# Patient Record
Sex: Male | Born: 1960
Health system: Southern US, Community
[De-identification: ages and names within clinical notes are randomized; demographics above are authoritative.]

## PROBLEM LIST (undated history)

## (undated) DIAGNOSIS — J42 Unspecified chronic bronchitis: Secondary | ICD-10-CM

## (undated) DIAGNOSIS — J45909 Unspecified asthma, uncomplicated: Secondary | ICD-10-CM

## (undated) DIAGNOSIS — E119 Type 2 diabetes mellitus without complications: Secondary | ICD-10-CM

## (undated) DIAGNOSIS — I10 Essential (primary) hypertension: Secondary | ICD-10-CM

## (undated) HISTORY — PX: OTHER SURGICAL HISTORY: SHX169

## (undated) SURGERY — Surgical Case
Anesthesia: *Unknown

---

## 1998-05-17 ENCOUNTER — Emergency Department (HOSPITAL_COMMUNITY): Admission: EM | Admit: 1998-05-17 | Discharge: 1998-05-17 | Payer: Self-pay | Admitting: Emergency Medicine

## 2005-05-10 ENCOUNTER — Encounter: Admission: RE | Admit: 2005-05-10 | Discharge: 2005-05-10 | Payer: Self-pay | Admitting: Neurological Surgery

## 2005-05-18 ENCOUNTER — Ambulatory Visit (HOSPITAL_COMMUNITY): Admission: RE | Admit: 2005-05-18 | Discharge: 2005-05-19 | Payer: Self-pay | Admitting: Neurological Surgery

## 2005-09-04 ENCOUNTER — Encounter: Admission: RE | Admit: 2005-09-04 | Discharge: 2005-09-04 | Payer: Self-pay | Admitting: Neurological Surgery

## 2005-12-05 ENCOUNTER — Encounter: Admission: RE | Admit: 2005-12-05 | Discharge: 2005-12-05 | Payer: Self-pay | Admitting: Neurological Surgery

## 2005-12-24 ENCOUNTER — Inpatient Hospital Stay (HOSPITAL_COMMUNITY): Admission: EM | Admit: 2005-12-24 | Discharge: 2005-12-24 | Payer: Self-pay | Admitting: Emergency Medicine

## 2006-04-03 ENCOUNTER — Encounter: Admission: RE | Admit: 2006-04-03 | Discharge: 2006-04-03 | Payer: Self-pay | Admitting: Neurological Surgery

## 2006-06-20 ENCOUNTER — Ambulatory Visit (HOSPITAL_BASED_OUTPATIENT_CLINIC_OR_DEPARTMENT_OTHER): Admission: RE | Admit: 2006-06-20 | Discharge: 2006-06-20 | Payer: Self-pay | Admitting: Orthopedic Surgery

## 2013-08-12 DIAGNOSIS — Z9889 Other specified postprocedural states: Secondary | ICD-10-CM | POA: Insufficient documentation

## 2015-02-10 DIAGNOSIS — S62639D Displaced fracture of distal phalanx of unspecified finger, subsequent encounter for fracture with routine healing: Secondary | ICD-10-CM | POA: Insufficient documentation

## 2016-06-05 DIAGNOSIS — I219 Acute myocardial infarction, unspecified: Secondary | ICD-10-CM

## 2016-06-05 HISTORY — DX: Acute myocardial infarction, unspecified: I21.9

## 2016-06-23 DIAGNOSIS — M9901 Segmental and somatic dysfunction of cervical region: Secondary | ICD-10-CM | POA: Diagnosis not present

## 2016-06-23 DIAGNOSIS — M9902 Segmental and somatic dysfunction of thoracic region: Secondary | ICD-10-CM | POA: Diagnosis not present

## 2016-06-23 DIAGNOSIS — M531 Cervicobrachial syndrome: Secondary | ICD-10-CM | POA: Diagnosis not present

## 2016-06-23 DIAGNOSIS — M5032 Other cervical disc degeneration, mid-cervical region, unspecified level: Secondary | ICD-10-CM | POA: Diagnosis not present

## 2016-06-26 DIAGNOSIS — M531 Cervicobrachial syndrome: Secondary | ICD-10-CM | POA: Diagnosis not present

## 2016-06-26 DIAGNOSIS — M5032 Other cervical disc degeneration, mid-cervical region, unspecified level: Secondary | ICD-10-CM | POA: Diagnosis not present

## 2016-06-26 DIAGNOSIS — M9901 Segmental and somatic dysfunction of cervical region: Secondary | ICD-10-CM | POA: Diagnosis not present

## 2016-06-26 DIAGNOSIS — M9902 Segmental and somatic dysfunction of thoracic region: Secondary | ICD-10-CM | POA: Diagnosis not present

## 2016-06-28 DIAGNOSIS — M9901 Segmental and somatic dysfunction of cervical region: Secondary | ICD-10-CM | POA: Diagnosis not present

## 2016-06-28 DIAGNOSIS — M9902 Segmental and somatic dysfunction of thoracic region: Secondary | ICD-10-CM | POA: Diagnosis not present

## 2016-06-28 DIAGNOSIS — M5032 Other cervical disc degeneration, mid-cervical region, unspecified level: Secondary | ICD-10-CM | POA: Diagnosis not present

## 2016-06-28 DIAGNOSIS — M531 Cervicobrachial syndrome: Secondary | ICD-10-CM | POA: Diagnosis not present

## 2016-06-29 DIAGNOSIS — M542 Cervicalgia: Secondary | ICD-10-CM | POA: Diagnosis not present

## 2016-06-29 DIAGNOSIS — J0100 Acute maxillary sinusitis, unspecified: Secondary | ICD-10-CM | POA: Diagnosis not present

## 2016-06-29 DIAGNOSIS — J209 Acute bronchitis, unspecified: Secondary | ICD-10-CM | POA: Diagnosis not present

## 2016-06-29 DIAGNOSIS — R062 Wheezing: Secondary | ICD-10-CM | POA: Diagnosis not present

## 2016-06-30 DIAGNOSIS — M531 Cervicobrachial syndrome: Secondary | ICD-10-CM | POA: Diagnosis not present

## 2016-06-30 DIAGNOSIS — M5032 Other cervical disc degeneration, mid-cervical region, unspecified level: Secondary | ICD-10-CM | POA: Diagnosis not present

## 2016-06-30 DIAGNOSIS — M9902 Segmental and somatic dysfunction of thoracic region: Secondary | ICD-10-CM | POA: Diagnosis not present

## 2016-06-30 DIAGNOSIS — M9901 Segmental and somatic dysfunction of cervical region: Secondary | ICD-10-CM | POA: Diagnosis not present

## 2016-07-03 DIAGNOSIS — M5032 Other cervical disc degeneration, mid-cervical region, unspecified level: Secondary | ICD-10-CM | POA: Diagnosis not present

## 2016-07-03 DIAGNOSIS — M531 Cervicobrachial syndrome: Secondary | ICD-10-CM | POA: Diagnosis not present

## 2016-07-03 DIAGNOSIS — M9901 Segmental and somatic dysfunction of cervical region: Secondary | ICD-10-CM | POA: Diagnosis not present

## 2016-07-03 DIAGNOSIS — M9902 Segmental and somatic dysfunction of thoracic region: Secondary | ICD-10-CM | POA: Diagnosis not present

## 2016-07-05 DIAGNOSIS — M9901 Segmental and somatic dysfunction of cervical region: Secondary | ICD-10-CM | POA: Diagnosis not present

## 2016-07-05 DIAGNOSIS — M531 Cervicobrachial syndrome: Secondary | ICD-10-CM | POA: Diagnosis not present

## 2016-07-05 DIAGNOSIS — M9902 Segmental and somatic dysfunction of thoracic region: Secondary | ICD-10-CM | POA: Diagnosis not present

## 2016-07-05 DIAGNOSIS — M5032 Other cervical disc degeneration, mid-cervical region, unspecified level: Secondary | ICD-10-CM | POA: Diagnosis not present

## 2016-07-06 DIAGNOSIS — M5412 Radiculopathy, cervical region: Secondary | ICD-10-CM | POA: Diagnosis not present

## 2016-07-06 DIAGNOSIS — Z981 Arthrodesis status: Secondary | ICD-10-CM | POA: Diagnosis not present

## 2016-07-18 DIAGNOSIS — M5412 Radiculopathy, cervical region: Secondary | ICD-10-CM | POA: Diagnosis not present

## 2016-11-08 DIAGNOSIS — J0100 Acute maxillary sinusitis, unspecified: Secondary | ICD-10-CM | POA: Diagnosis not present

## 2016-11-08 DIAGNOSIS — J209 Acute bronchitis, unspecified: Secondary | ICD-10-CM | POA: Diagnosis not present

## 2016-12-17 ENCOUNTER — Emergency Department (HOSPITAL_COMMUNITY): Payer: Federal, State, Local not specified - PPO

## 2016-12-17 ENCOUNTER — Inpatient Hospital Stay (HOSPITAL_COMMUNITY)
Admission: EM | Admit: 2016-12-17 | Discharge: 2016-12-20 | DRG: 247 | Disposition: A | Discharge status: Home / Self Care | Payer: Federal, State, Local not specified - PPO | Attending: Internal Medicine | Admitting: Internal Medicine

## 2016-12-17 ENCOUNTER — Encounter (HOSPITAL_COMMUNITY): Payer: Self-pay | Admitting: Emergency Medicine

## 2016-12-17 DIAGNOSIS — Z8042 Family history of malignant neoplasm of prostate: Secondary | ICD-10-CM

## 2016-12-17 DIAGNOSIS — R739 Hyperglycemia, unspecified: Secondary | ICD-10-CM | POA: Diagnosis not present

## 2016-12-17 DIAGNOSIS — E119 Type 2 diabetes mellitus without complications: Secondary | ICD-10-CM | POA: Diagnosis not present

## 2016-12-17 DIAGNOSIS — G4440 Drug-induced headache, not elsewhere classified, not intractable: Secondary | ICD-10-CM | POA: Diagnosis present

## 2016-12-17 DIAGNOSIS — R0789 Other chest pain: Secondary | ICD-10-CM | POA: Diagnosis not present

## 2016-12-17 DIAGNOSIS — I10 Essential (primary) hypertension: Secondary | ICD-10-CM | POA: Diagnosis not present

## 2016-12-17 DIAGNOSIS — Z888 Allergy status to other drugs, medicaments and biological substances status: Secondary | ICD-10-CM | POA: Diagnosis not present

## 2016-12-17 DIAGNOSIS — R631 Polydipsia: Secondary | ICD-10-CM | POA: Diagnosis present

## 2016-12-17 DIAGNOSIS — Z955 Presence of coronary angioplasty implant and graft: Secondary | ICD-10-CM

## 2016-12-17 DIAGNOSIS — Z6832 Body mass index (BMI) 32.0-32.9, adult: Secondary | ICD-10-CM | POA: Diagnosis not present

## 2016-12-17 DIAGNOSIS — Z87891 Personal history of nicotine dependence: Secondary | ICD-10-CM

## 2016-12-17 DIAGNOSIS — R079 Chest pain, unspecified: Secondary | ICD-10-CM | POA: Diagnosis not present

## 2016-12-17 DIAGNOSIS — I214 Non-ST elevation (NSTEMI) myocardial infarction: Secondary | ICD-10-CM | POA: Diagnosis not present

## 2016-12-17 DIAGNOSIS — T463X5A Adverse effect of coronary vasodilators, initial encounter: Secondary | ICD-10-CM | POA: Diagnosis present

## 2016-12-17 DIAGNOSIS — Z833 Family history of diabetes mellitus: Secondary | ICD-10-CM

## 2016-12-17 DIAGNOSIS — Z8249 Family history of ischemic heart disease and other diseases of the circulatory system: Secondary | ICD-10-CM | POA: Diagnosis not present

## 2016-12-17 DIAGNOSIS — R351 Nocturia: Secondary | ICD-10-CM | POA: Diagnosis present

## 2016-12-17 DIAGNOSIS — I255 Ischemic cardiomyopathy: Secondary | ICD-10-CM | POA: Diagnosis present

## 2016-12-17 DIAGNOSIS — E1100 Type 2 diabetes mellitus with hyperosmolarity without nonketotic hyperglycemic-hyperosmolar coma (NKHHC): Secondary | ICD-10-CM

## 2016-12-17 DIAGNOSIS — I2510 Atherosclerotic heart disease of native coronary artery without angina pectoris: Secondary | ICD-10-CM | POA: Diagnosis not present

## 2016-12-17 DIAGNOSIS — Z880 Allergy status to penicillin: Secondary | ICD-10-CM

## 2016-12-17 DIAGNOSIS — E669 Obesity, unspecified: Secondary | ICD-10-CM | POA: Diagnosis not present

## 2016-12-17 HISTORY — DX: Unspecified asthma, uncomplicated: J45.909

## 2016-12-17 HISTORY — DX: Essential (primary) hypertension: I10

## 2016-12-17 HISTORY — DX: Unspecified chronic bronchitis: J42

## 2016-12-17 LAB — CBC
HCT: 43 % (ref 39.0–52.0)
Hemoglobin: 14.4 g/dL (ref 13.0–17.0)
MCH: 28.7 pg (ref 26.0–34.0)
MCHC: 33.5 g/dL (ref 30.0–36.0)
MCV: 85.8 fL (ref 78.0–100.0)
PLATELETS: 215 10*3/uL (ref 150–400)
RBC: 5.01 MIL/uL (ref 4.22–5.81)
RDW: 13.8 % (ref 11.5–15.5)
WBC: 7.2 10*3/uL (ref 4.0–10.5)

## 2016-12-17 LAB — GLUCOSE, CAPILLARY
GLUCOSE-CAPILLARY: 244 mg/dL — AB (ref 65–99)
GLUCOSE-CAPILLARY: 263 mg/dL — AB (ref 65–99)

## 2016-12-17 LAB — D-DIMER, QUANTITATIVE: D-Dimer, Quant: 0.39 ug/mL-FEU (ref 0.00–0.50)

## 2016-12-17 LAB — BASIC METABOLIC PANEL
Anion gap: 7 (ref 5–15)
BUN: 11 mg/dL (ref 6–20)
CALCIUM: 8.5 mg/dL — AB (ref 8.9–10.3)
CHLORIDE: 105 mmol/L (ref 101–111)
CO2: 21 mmol/L — AB (ref 22–32)
CREATININE: 0.98 mg/dL (ref 0.61–1.24)
GFR calc Af Amer: >60 mL/min (ref >60)
GFR calc non Af Amer: >60 mL/min (ref >60)
GLUCOSE: 349 mg/dL — AB (ref 65–99)
Potassium: 4.1 mmol/L (ref 3.5–5.1)
Sodium: 133 mmol/L — ABNORMAL LOW (ref 135–145)

## 2016-12-17 LAB — I-STAT TROPONIN, ED
TROPONIN I, POC: 0.05 ng/mL (ref 0.00–0.08)
Troponin i, poc: 0.31 ng/mL (ref 0.00–0.08)

## 2016-12-17 LAB — HEPARIN LEVEL (UNFRACTIONATED): Heparin Unfractionated: 0.48 IU/mL (ref 0.30–0.70)

## 2016-12-17 LAB — MRSA PCR SCREENING: MRSA by PCR: NEGATIVE

## 2016-12-17 LAB — TROPONIN I
Troponin I: 4.21 ng/mL (ref <0.03)
Troponin I: 6.31 ng/mL (ref <0.03)

## 2016-12-17 MED ORDER — ATORVASTATIN CALCIUM 80 MG PO TABS
80.0000 mg | ORAL_TABLET | Freq: Every day | ORAL | Status: DC
  Administered 2016-12-18 – 2016-12-19 (×2): 80 mg via ORAL
  Filled 2016-12-17 (×2): qty 1

## 2016-12-17 MED ORDER — INSULIN ASPART 100 UNIT/ML ~~LOC~~ SOLN
0.0000 [IU] | Freq: Every day | SUBCUTANEOUS | Status: DC
  Administered 2016-12-17: 3 [IU] via SUBCUTANEOUS
  Administered 2016-12-18: 2 [IU] via SUBCUTANEOUS
  Filled 2016-12-17: qty 0.05

## 2016-12-17 MED ORDER — MORPHINE SULFATE (PF) 4 MG/ML IV SOLN
4.0000 mg | Freq: Once | INTRAVENOUS | Status: AC
  Administered 2016-12-17: 4 mg via INTRAVENOUS
  Filled 2016-12-17: qty 1

## 2016-12-17 MED ORDER — SODIUM CHLORIDE 0.9 % IV BOLUS (SEPSIS)
1000.0000 mL | Freq: Once | INTRAVENOUS | Status: AC
  Administered 2016-12-17: 1000 mL via INTRAVENOUS

## 2016-12-17 MED ORDER — ASPIRIN 81 MG PO CHEW
81.0000 mg | CHEWABLE_TABLET | ORAL | Status: AC
  Administered 2016-12-18: 81 mg via ORAL
  Filled 2016-12-17: qty 1

## 2016-12-17 MED ORDER — NITROGLYCERIN IN D5W 200-5 MCG/ML-% IV SOLN
0.0000 ug/min | INTRAVENOUS | Status: DC
  Administered 2016-12-17: 5 ug/min via INTRAVENOUS
  Filled 2016-12-17: qty 250

## 2016-12-17 MED ORDER — SODIUM CHLORIDE 0.9% FLUSH: 3.0000 mL | INTRAVENOUS | Status: DC | PRN

## 2016-12-17 MED ORDER — MORPHINE SULFATE (PF) 4 MG/ML IV SOLN
6.0000 mg | Freq: Once | INTRAVENOUS | Status: AC
  Administered 2016-12-17: 6 mg via INTRAVENOUS
  Filled 2016-12-17: qty 2

## 2016-12-17 MED ORDER — NITROGLYCERIN 0.4 MG SL SUBL: 0.4000 mg | SUBLINGUAL_TABLET | SUBLINGUAL | Status: DC | PRN

## 2016-12-17 MED ORDER — ATORVASTATIN CALCIUM 40 MG PO TABS
40.0000 mg | ORAL_TABLET | Freq: Every day | ORAL | Status: DC
  Administered 2016-12-17: 40 mg via ORAL
  Filled 2016-12-17: qty 1

## 2016-12-17 MED ORDER — ALBUTEROL SULFATE (2.5 MG/3ML) 0.083% IN NEBU: 2.5000 mg | INHALATION_SOLUTION | Freq: Once | RESPIRATORY_TRACT | Status: DC

## 2016-12-17 MED ORDER — SODIUM CHLORIDE 0.9 % IV SOLN: 250.0000 mL | INTRAVENOUS | Status: DC | PRN

## 2016-12-17 MED ORDER — KETOROLAC TROMETHAMINE 30 MG/ML IJ SOLN
30.0000 mg | Freq: Once | INTRAMUSCULAR | Status: AC
  Administered 2016-12-17: 30 mg via INTRAVENOUS
  Filled 2016-12-17: qty 1

## 2016-12-17 MED ORDER — SODIUM CHLORIDE 0.9% FLUSH
3.0000 mL | Freq: Two times a day (BID) | INTRAVENOUS | Status: DC
  Administered 2016-12-17 – 2016-12-18 (×2): 3 mL via INTRAVENOUS

## 2016-12-17 MED ORDER — SODIUM CHLORIDE 0.9 % WEIGHT BASED INFUSION
1.0000 mL/kg/h | INTRAVENOUS | Status: DC
  Administered 2016-12-18: 500 mL via INTRAVENOUS

## 2016-12-17 MED ORDER — INSULIN ASPART 100 UNIT/ML ~~LOC~~ SOLN
0.0000 [IU] | Freq: Three times a day (TID) | SUBCUTANEOUS | Status: DC
  Administered 2016-12-17: 5 [IU] via SUBCUTANEOUS
  Administered 2016-12-18: 11 [IU] via SUBCUTANEOUS
  Administered 2016-12-18: 8 [IU] via SUBCUTANEOUS
  Administered 2016-12-19: 2 [IU] via SUBCUTANEOUS
  Administered 2016-12-19: 3 [IU] via SUBCUTANEOUS
  Administered 2016-12-19: 5 [IU] via SUBCUTANEOUS
  Administered 2016-12-20 (×2): 3 [IU] via SUBCUTANEOUS
  Filled 2016-12-17: qty 0.15

## 2016-12-17 MED ORDER — CARVEDILOL 6.25 MG PO TABS
6.2500 mg | ORAL_TABLET | Freq: Two times a day (BID) | ORAL | Status: DC
  Administered 2016-12-17 – 2016-12-20 (×6): 6.25 mg via ORAL
  Filled 2016-12-17 (×6): qty 1

## 2016-12-17 MED ORDER — HEPARIN (PORCINE) IN NACL 100-0.45 UNIT/ML-% IJ SOLN
1400.0000 [IU]/h | INTRAMUSCULAR | Status: DC
  Administered 2016-12-17: 1400 [IU]/h via INTRAVENOUS
  Filled 2016-12-17 (×2): qty 250

## 2016-12-17 MED ORDER — ONDANSETRON HCL 4 MG/2ML IJ SOLN: 4.0000 mg | Freq: Four times a day (QID) | INTRAMUSCULAR | Status: DC | PRN

## 2016-12-17 MED ORDER — ALPRAZOLAM 0.25 MG PO TABS
0.2500 mg | ORAL_TABLET | Freq: Every evening | ORAL | Status: DC | PRN
  Administered 2016-12-17 – 2016-12-19 (×3): 0.25 mg via ORAL
  Filled 2016-12-17 (×3): qty 1

## 2016-12-17 MED ORDER — SODIUM CHLORIDE 0.9 % WEIGHT BASED INFUSION
3.0000 mL/kg/h | INTRAVENOUS | Status: DC
  Administered 2016-12-18: 3 mL/kg/h via INTRAVENOUS

## 2016-12-17 MED ORDER — MORPHINE SULFATE (PF) 4 MG/ML IV SOLN
4.0000 mg | INTRAVENOUS | Status: DC | PRN
  Administered 2016-12-17: 4 mg via INTRAVENOUS
  Filled 2016-12-17: qty 1

## 2016-12-17 MED ORDER — ASPIRIN EC 81 MG PO TBEC: 81.0000 mg | DELAYED_RELEASE_TABLET | Freq: Every day | ORAL | Status: DC

## 2016-12-17 MED ORDER — HEPARIN SODIUM (PORCINE) 5000 UNIT/ML IJ SOLN
4000.0000 [IU] | Freq: Once | INTRAMUSCULAR | Status: AC
  Administered 2016-12-17: 4000 [IU] via INTRAVENOUS

## 2016-12-17 MED ORDER — IPRATROPIUM-ALBUTEROL 0.5-2.5 (3) MG/3ML IN SOLN
3.0000 mL | Freq: Once | RESPIRATORY_TRACT | Status: AC
  Administered 2016-12-17: 3 mL via RESPIRATORY_TRACT
  Filled 2016-12-17: qty 3

## 2016-12-17 NOTE — Progress Notes (Signed)
CRITICAL VALUE ALERT  Critical Value:  Troponin 4.21  Date & Time Notied:  7/15 1730  Provider Notified: Iver NestleBhagat PA  Orders Received/Actions taken:

## 2016-12-17 NOTE — Progress Notes (Signed)
Critical troponin, 4.21, received. Page text MD on call with results. Pt denies any chest pain, pt is not sob and remains sinus rhythm on monitor. Heparin and nitro gtt's continue.

## 2016-12-17 NOTE — ED Notes (Signed)
Cardiology rounding at bedside.

## 2016-12-17 NOTE — ED Provider Notes (Signed)
[facility]-EMERGENCY DEPT Provider Note   CSN: [id number] Arrival date & time: 12/17/16  0534     History   Chief Complaint Chief Complaint  Patient presents with  . Chest Pain    HPI Connor Reed is a 56 y.o. male.  HPI 56 y.o. male with a hx of HTN, presents to the Emergency Department today due to chest pain when waking this AM around 0300. This woke pt from sleep. Pt states it feels bilateral pressure to both lungs and feels like it is going into his back. Notes pain has been constant since 0300 and rates 8/10. Feels like cramping sensation. Notes diaphoresis. No N/V. Noted numbness/tingling to left shoulder. EMs notified and given NTG x 2 as well as ASA with no change in symptoms. Denies abdominal pain. Notes shortness of breath. Noted URI symptoms without fevers. No cough. No hx ACS. No FH. Risk Factors for ACS include: HTN. No hx DVT/PE. No recent travel. No recent surgeries. No other symptoms noted.   Past Medical History:  Diagnosis Date  . Hypertension     There are no active problems to display for this patient.   History reviewed. No pertinent surgical history.     Home Medications    Prior to Admission medications   Not on File    Family History No family history on file.  Social History Social History  Substance Use Topics  . Smoking status: Former Smoker    Packs/day: 1.00    Years: 20.00    Quit date: 06/19/1990  . Smokeless tobacco: Never Used  . Alcohol use Yes     Comment: drank 12 pack yesterday; typically 1 x per week     Allergies   Patient has no allergy information on record.   Review of Systems Review of Systems ROS reviewed and all are negative for acute change except as noted in the HPI.  Physical Exam Updated Vital Signs BP 139/90   Temp 97.9 F (36.6 C) (Oral)   Resp 14   Ht 6' (1.829 m)   Wt 108.9 kg (240 lb)   SpO2 93%   BMI 32.55 kg/m   Physical Exam  Constitutional: He is oriented to person, place, and time. Vital  signs are normal. He appears well-developed and well-nourished.  Pt clammy. NAD.   HENT:  Head: Normocephalic and atraumatic.  Right Ear: Hearing normal.  Left Ear: Hearing normal.  Eyes: Pupils are equal, round, and reactive to light. Conjunctivae and EOM are normal.  Neck: Normal range of motion. Neck supple.  Cardiovascular: Normal rate, regular rhythm, normal heart sounds and intact distal pulses.   BP Equal BUE ~139/90  Pulmonary/Chest: Effort normal and breath sounds normal.  Abdominal: Soft. Bowel sounds are normal. There is no tenderness. There is no rigidity, no rebound, no guarding, no CVA tenderness, no tenderness at McBurney's point and negative Murphy's sign.  Musculoskeletal: Normal range of motion.  Neurological: He is alert and oriented to person, place, and time.  Skin: Skin is warm and dry.  Psychiatric: He has a normal mood and affect. His speech is normal and behavior is normal. Thought content normal.  Nursing note and vitals reviewed.  ED Treatments / Results  Labs (all labs ordered are listed, but only abnormal results are displayed) Labs Reviewed  BASIC METABOLIC PANEL - Abnormal; Notable for the following:       Result Value   Sodium 133 (*)    CO2 21 (*)    Glucose, Bld  349 (*)    Calcium 8.5 (*)    All other components within normal limits  I-STAT TROPOININ, ED - Abnormal; Notable for the following:    Troponin i, poc 0.31 (*)    All other components within normal limits  CBC  D-DIMER, QUANTITATIVE (NOT AT [facility])  HEPARIN LEVEL (UNFRACTIONATED)  I-STAT TROPOININ, ED    EKG  EKG Interpretation  Date/Time:  Sunday December 17 2016 06:31:54 EDT Ventricular Rate:  72 PR Interval:    QRS Duration: 98 QT Interval:  370 QTC Calculation: 405 R Axis:   36 Text Interpretation:  Sinus rhythm No significant change since last tracing Confirmed by Doug Sou [phone]) on 12/17/2016 10:02:05 AM       Radiology Dg Chest 2 View  Result Date:  12/17/2016 CLINICAL DATA:  Chest pain beginning at 0300 hours, diaphoresis. History of hypertension. EXAM: CHEST  2 VIEW COMPARISON:  Chest radiograph December 24, 2005 FINDINGS: Cardiomediastinal silhouette is normal. No pleural effusions or focal consolidations. Trachea projects midline and there is no pneumothorax. Soft tissue planes and included osseous structures are non-suspicious. ACDF. Mild degenerative change of the thoracic spine. IMPRESSION: No active cardiopulmonary disease. Electronically Signed   By: Awilda Metro M.D.   On: 12/17/2016 06:38   Procedures Procedures (including critical care time) CRITICAL CARE Performed by: Eston Esters   Total critical care time: 35 minutes  Critical care time was exclusive of separately billable procedures and treating other patients.  Critical care was necessary to treat or prevent imminent or life-threatening deterioration.  Critical care was time spent personally by me on the following activities: development of treatment plan with patient and/or surrogate as well as nursing, discussions with consultants, evaluation of patient's response to treatment, examination of patient, obtaining history from patient or surrogate, ordering and performing treatments and interventions, ordering and review of laboratory studies, ordering and review of radiographic studies, pulse oximetry and re-evaluation of patient's condition.   Medications Ordered in ED Medications  heparin injection 4,000 Units (not administered)  nitroGLYCERIN 50 mg in dextrose 5 % 250 mL (0.2 mg/mL) infusion (not administered)  heparin ADULT infusion 100 units/mL (25000 units/264mL sodium chloride 0.45%) (not administered)  sodium chloride 0.9 % bolus 1,000 mL (0 mLs Intravenous Stopped 12/17/16 0831)  morphine 4 MG/ML injection 4 mg (4 mg Intravenous Given 12/17/16 0647)  ketorolac (TORADOL) 30 MG/ML injection 30 mg (30 mg Intravenous Given 12/17/16 0827)  ipratropium-albuterol  (DUONEB) 0.5-2.5 (3) MG/3ML nebulizer solution 3 mL (3 mLs Nebulization Given 12/17/16 0827)  morphine 4 MG/ML injection 6 mg (6 mg Intravenous Given 12/17/16 0956)   Initial Impression / Assessment and Plan / ED Course  I have reviewed the triage vital signs and the nursing notes.  Pertinent labs & imaging results that were available during my care of the patient were reviewed by me and considered in my medical decision making (see chart for details).  Final Clinical Impressions(s) / ED Diagnoses  {I have reviewed and evaluated the relevant laboratory values. {I have reviewed and evaluated the relevant imaging studies. {I have interpreted the relevant EKG. {I have reviewed the relevant previous healthcare records.  {I obtained HPI from historian. {Patient discussed with supervising physician.  ED Course:  Assessment: Pt is a 56 y.o. male with a hx of HTN, presents to the Emergency Department today due to chest pain when waking this AM around 0300. This woke pt from sleep. Pt states it feels bilateral pressure to both lungs and feels like  it is going into his back. Notes pain has been constant since 0300 and rates 8/10. Feels like cramping sensation. Notes diaphoresis. No N/V. Noted numbness/tingling to left shoulder. EMs notified and given NTG x 2 as well as ASA with no change in symptoms. Denies abdominal pain. Notes shortness of breath. Noted URI symptoms without fevers. No cough. No hx ACS. No FH. Risk Factors for ACS include: HTN. No hx DVT/PE. No recent travel. No recent surgeries. On exam, pt in NAD. Appears mildly diaphoretic. Nontoxic/nonseptic appearing. VSS. Afebrile. Lungs CTA. Heart RRR. Abdomen nontender soft. CBC unremarkable. BMP unremarkable. Trop negative. EKG without acute change x 2. CXR unremarkable. D Dimer negative. Heart Score 3. Given morphine in ED. Discussed with attending physician. Discussed options with patient about possible admission for chest pain rule out vs going home  with follow up to Cardiology pending the delta Troponin. Discussed risks and benefits and strict return precautions.   9:44 AM Delta Troponin elevated 0.31 from 0.05. Given Nitro as well as Heparin. Consult to Cardiology. Requested hospitalist admission due to elevated glucose for new onset DM management. Will admit   Disposition/Plan:  Admit Pt acknowledges and agrees with plan  Supervising Physician Doug Sou, MD  Final diagnoses:  NSTEMI (non-ST elevated myocardial infarction) [facility]  Hyperglycemia    New Prescriptions New Prescriptions   No medications on file     Audry Pili, Cordelia Poche 12/17/16 1013    Doug Sou, MD 12/17/16 1736

## 2016-12-17 NOTE — Consult Note (Signed)
Cardiology Consultation:   Patient ID: Connor Reed; [id number]; 23-Aug-1960   Admit date: 12/17/2016 Date of Consult: 12/17/2016  Primary Care Provider: Patient, No Pcp Per Primary Cardiologist: New to [facility]   Patient Profile:   Connor Reed is a 56 y.o. male with prior history of hypertension (not on any medications) who is being seen today for the evaluation of chest pain/non-STEMI at the request of Dr. Rogelia Boga.  No prior cardiac history. Prior tobacco smoker, quit in 1992. He smoked for 20-25 years.  History of Present Illness:   Connor Reed woke up from sleep around 3 AM this morning with left-sided chest pressure 8/10  radiating to his back and left shoulder. Felt like "deep inside the lung". No associated shortness of breath, diaphoresis, nausea or vomiting. No improvement by sublingual nitroglycerin x 2 and aspirin by EMS. His pain somewhat improved after IV morphine in ER. Still having mild deep chest discomfort 2 out of 10. He denies prior similar symptoms taking up from sleep. However, his chest pain is similar when he had a bronchitis in past. He exercises few times in a week without chest pain or shortness of breath. Denies palpitation, dizziness, orthopnea, PND, syncope, lower approximately edema, snoring or blood in his stool or urine.  Blood pressure intermittently elevated. Point-of-care troponin 0.05  --> 0.31. Blood sugars running high in 300s. D-dimer normal. EKG shows normal sinus rhythm with anterior Q-wave. No acute ST/T-wave changes. No prior EKG to compare - personally reviewed.  He is started on IV heparin and IV nitroglycerin.    Past Medical History:  Diagnosis Date  . Hypertension     History reviewed. No pertinent surgical history.   Inpatient Medications: Scheduled Meds: . [START ON 12/18/2016] aspirin EC  81 mg Oral Daily  . atorvastatin  40 mg Oral q1800  . carvedilol  6.25 mg Oral BID WC  . insulin aspart  0-15 Units Subcutaneous TID WC  .  insulin aspart  0-5 Units Subcutaneous QHS   Continuous Infusions: . heparin 1,400 Units/hr (12/17/16 1114)  . nitroGLYCERIN 5 mcg/min (12/17/16 1117)   PRN Meds: nitroGLYCERIN, ondansetron (ZOFRAN) IV  Allergies:    Allergies  Allergen Reactions  . Acetaminophen Itching and Rash  . Penicillins Rash, Shortness Of Breath and Swelling    Social History:   Social History   Social History  . Marital status: Married    Spouse name: N/A  . Number of children: N/A  . Years of education: N/A   Occupational History  . Not on file.   Social History Main Topics  . Smoking status: Former Smoker    Packs/day: 1.00    Years: 20.00    Quit date: 06/19/1990  . Smokeless tobacco: Never Used  . Alcohol use Yes     Comment: drank 12 pack yesterday; typically 1 x per week  . Drug use: No  . Sexual activity: Not on file   Other Topics Concern  . Not on file   Social History Narrative  . No narrative on file    Family History:   The patient's family history includes Cancer in his father; Diabetes in his brother; Heart attack in his maternal grandfather.  ROS:  Please see the history of present illness.  ROS All other ROS reviewed and negative.     Physical Exam/Data:   Vitals:   12/17/16 1130 12/17/16 1145 12/17/16 1200 12/17/16 1215  BP: (!) 156/96 (!) 145/98 (!) 145/91 (!) 133/98  Pulse: 78  73 70 71  Resp: 17 17 15 16   Temp:      TempSrc:      SpO2: 94% 96% (!) 86% 93%  Weight:      Height:        Intake/Output Summary (Last 24 hours) at 12/17/16 1233 Last data filed at 12/17/16 0831  Gross per 24 hour  Intake             1000 ml  Output                0 ml  Net             1000 ml   Filed Weights   12/17/16 0548  Weight: 240 lb (108.9 kg)   Body mass index is 32.55 kg/m.  General:  Well nourished, well developed, in no acute distress HEENT: normal Lymph: no adenopathy Neck: no JVD Endocrine:  No thryomegaly Vascular: No carotid bruits; FA pulses 2+  bilaterally without bruits  Cardiac:  normal S1, S2; RRR; no murmur Lungs:  clear to auscultation bilaterally, no wheezing, rhonchi or rales  Abd: soft, nontender, no hepatomegaly  Ext: no edema Musculoskeletal:  No deformities, BUE and BLE strength normal and equal Skin: warm and dry  Neuro:  CNs 2-12 intact, no focal abnormalities noted Psych:  Normal affect   Laboratory Data:  Chemistry  Recent Labs Lab 12/17/16 0553  NA 133*  K 4.1  CL 105  CO2 21*  GLUCOSE 349*  BUN 11  CREATININE 0.98  CALCIUM 8.5*  GFRNONAA >60  GFRAA >60  ANIONGAP 7    No results for input(s): PROT, ALBUMIN, AST, ALT, ALKPHOS, BILITOT in the last 168 hours. Hematology  Recent Labs Lab 12/17/16 0553  WBC 7.2  RBC 5.01  HGB 14.4  HCT 43.0  MCV 85.8  MCH 28.7  MCHC 33.5  RDW 13.8  PLT 215   Cardiac EnzymesNo results for input(s): TROPONINI in the last 168 hours.   Recent Labs Lab 12/17/16 0608 12/17/16 0931  TROPIPOC 0.05 0.31*    BNPNo results for input(s): BNP, PROBNP in the last 168 hours.  DDimer   Recent Labs Lab 12/17/16 0553  DDIMER 0.39    Radiology/Studies:  Dg Chest 2 View  Result Date: 12/17/2016 CLINICAL DATA:  Chest pain beginning at 0300 hours, diaphoresis. History of hypertension. EXAM: CHEST  2 VIEW COMPARISON:  Chest radiograph December 24, 2005 FINDINGS: Cardiomediastinal silhouette is normal. No pleural effusions or focal consolidations. Trachea projects midline and there is no pneumothorax. Soft tissue planes and included osseous structures are non-suspicious. ACDF. Mild degenerative change of the thoracic spine. IMPRESSION: No active cardiopulmonary disease. Electronically Signed   By: Awilda Metroourtnay  Bloomer M.D.   On: 12/17/2016 06:38    Assessment and Plan:   1. Non-STEMI - His chest pain has a both typical and atypical features. Not reproducible with palpation. Did not improved after sublingual nitroglycerin and aspirin. Improve significantly after morphine.  Still has 2 out of 10 deep lung pain. Chest x-ray without acute cardiopulmonary disease. - Continue cycle troponin. Continue IV heparin and nitroglycerin. Keep NPO for cath tomorrow - Check lipid panel, A1c and TSH for risk stratification. Continue aspirin 81 mg, Lipitor 40 mg and Coreg 6.25 mg twice a day  2. Hypertension - Coreg started this admission.  3. Hyperglycemia - Per primary team  Signed, Point BlankBhagat,Bhavinkumar, PA  12/17/2016 12:33 PM   Personally seen and examined. Agree with above.  56 year old male with hypertension, obesity, former  smoker, diabetes here with chest discomfort, elevated troponin consistent with non-ST elevation myocardial infarction  Currently chest pain-free, no shortness of breath.  Exam: Alert and oriented 3 in no acute distress, lungs are clear bilaterally, skin is tan, heart regular rate and rhythm without any appreciable murmurs rubs or gallops, no JVD, no significant edema, abdomen is obese  EKG: Personally viewed demonstrate sinus rhythm with subtle accentuation of positive T-wave in early precordial leads otherwise no ST segment changes.  Non-ST elevation myocardial infarction  - Elevated troponin, recent chest pain. Continue to cycle serum troponins.  - Nothing by mouth, cardiac catheterization. Risks and benefits explained including stroke, heart attack, death, renal impairment, bleeding. Excellent radial pulse.  - Aspirin, beta blocker, IV nitroglycerin, IV heparin, statin.  Essential hypertension  - Agree with carvedilol. Continue to optimize medication  - Quite elevated.  Diabetes type 2  - Encourage weight loss. Medications per primary team. Check hemoglobin A1c.  Obesity  - Encourage weight loss.  Former smoker  Donato Schultz, MD

## 2016-12-17 NOTE — Progress Notes (Signed)
ANTICOAGULATION CONSULT NOTE - Follow-up Consult  Pharmacy Consult:  Heparin Indication: chest pain/ACS  Allergies  Allergen Reactions  . Acetaminophen Itching and Rash  . Penicillins Rash, Shortness Of Breath and Swelling   Patient Measurements: Height: 6' (182.9 cm) Weight: 237 lb 7 oz (107.7 kg) IBW/kg (Calculated) : 77.6 Heparin Dosing Weight: 100 kg  Vital Signs: Temp: 98.1 F (36.7 C) (07/15 1552) Temp Source: Oral (07/15 1552) BP: 147/96 (07/15 1600) Pulse Rate: 55 (07/15 1600)  Labs:  Recent Labs  12/17/16 0553 12/17/16 1602  HGB 14.4  --   HCT 43.0  --   PLT 215  --   HEPARINUNFRC  --  0.48  CREATININE 0.98  --   TROPONINI  --  4.21*   Estimated Creatinine Clearance: 105.4 mL/min (by C-G formula based on SCr of 0.98 mg/dL).  Medical History: Past Medical History:  Diagnosis Date  . Asthma   . Chronic bronchitis (HCC)   . Hypertension    Assessment: [age] YOM presented with chest and lung pain.  Pharmacy consulted to initiate IV heparin for ACS.  Troponin continue to rise now at 4.21, no chest pain reported.  Baseline labs and home med reviewed.  Heparin level therapeutic: 0.48, no bleeding reported  Goal of Therapy:  Heparin level 0.3-0.7 units/ml Monitor platelets by anticoagulation protocol: Yes   Plan:  -Continue heparin gtt at 1400 units/hr - Daily heparin level and CBC -Monitor for s/sx of bleeding  Ruben Imony Robecca Fulgham, PharmD Clinical Pharmacist 12/17/2016 6:51 PM

## 2016-12-17 NOTE — ED Notes (Signed)
Cards MD at bedside

## 2016-12-17 NOTE — ED Provider Notes (Signed)
Complains of anterior chest pressure intermittent for possible to month, sometimes radiates to left elbow. Symptoms include mild shortness of breath. Pain is nonexertional. Presently discomfort is moderate. On exam appears in no distress lungs clear to auscultation heart regular rate and rhythm abdomen nondistended nontender   Doug SouJacubowitz, Arthi Mcdonald, MD 12/17/16 1736

## 2016-12-17 NOTE — ED Triage Notes (Signed)
[facility] in [facility] with c/o "lung pain" and chest pain since waking at 0300 this morning. Pt states he has been diaphoretic, denies n/v or sob. States pain is worse when breathing in, lungs clear. Placed on 2L for comfort by EMS. Given 324 ASA, 2 NTG per EMS. Brought pain from 8/10 to 6/10. Alert, VSS

## 2016-12-17 NOTE — Progress Notes (Signed)
ANTICOAGULATION CONSULT NOTE - Initial Consult  Pharmacy Consult:  Heparin Indication: chest pain/ACS  Allergies  Allergen Reactions  . Acetaminophen Itching and Rash  . Penicillins Rash, Shortness Of Breath and Swelling    Patient Measurements: Height: 6' (182.9 cm) Weight: 240 lb (108.9 kg) IBW/kg (Calculated) : 77.6 Heparin Dosing Weight: 100 kg  Vital Signs: Temp: 97.9 F (36.6 C) (07/15 0545) Temp Source: Oral (07/15 0545) BP: 160/102 (07/15 0954) Pulse Rate: 91 (07/15 0954)  Labs:  Recent Labs  12/17/16 0553  HGB 14.4  HCT 43.0  PLT 215  CREATININE 0.98    Estimated Creatinine Clearance: 106 mL/min (by C-G formula based on SCr of 0.98 mg/dL).   Medical History: Past Medical History:  Diagnosis Date  . Hypertension       Assessment: [age] YOM presented with chest and lung pain.  Pharmacy consulted to initiate IV heparin for ACS.  Troponin elevated at 0.31.  Baseline labs and home med reviewed.   Goal of Therapy:  Heparin level 0.3-0.7 units/ml Monitor platelets by anticoagulation protocol: Yes    Plan:  - Heparin 4000 units IV as ordered, then - Heparin gtt at 1400 units/hr - Check 6 hr heparin level - Daily heparin level and CBC   Danyah Guastella D. Laney Potashang, PharmD, BCPS Pager:  [phone] 12/17/2016, 10:05 AM

## 2016-12-17 NOTE — H&P (Signed)
Date: 12/17/2016               Patient Name:  Connor Reed MRN: [id number]  DOB: 01/15/1961 Age / Sex: 56 y.o., male   PCP: Patient, No Pcp Per         Medical Service: Internal Medicine Teaching Service         Attending Physician: Dr. Burns SpainButcher, Elizabeth A, MD    First Contact: Dr. Delma Officerhundi Pager: [phone]  Second Contact: Dr. Dimple Caseyice Pager: [phone]       After Hours (After 5p/  First Contact Pager: [phone]  weekends / holidays): Second Contact Pager: [phone]   Chief Complaint: Chest Pain  History of Present Illness:  Connor Reed is a 56 y.o m with pmh of htn, chronic bronchitis, and asthma who presented with chest discomfort that started at 3am this morning (12/17/16). The patient describes the chest discomfort as 8/10 intensity, dull and achy in nature, constant, present over the anterior chest with radiation to the left shoulder blade. The patient has never had chest discomfort like this in the past. The patient states that he had accompanied diaphoresis and difficulty breathing. The patient states that he does not take any medications at home other than aleve occasionally and albuterol. The patient states that he has polydipsia, polyuria, nocturia, and weight gain. He denies any blurry vision, rashes, numbness/tingling.   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   ED course: Morphine, trend troponin, nitro, heparin, consulted cardiology, ekg, chest x-ray   Meds:  Current Meds  Medication Sig  . PROAIR HFA 108 (90 Base) MCG/ACT inhaler Inhale 2 puffs into the lungs 4 (four) times daily as needed for wheezing.      Allergies: Allergies as of 12/17/2016 - Review Complete 12/17/2016  Allergen Reaction Noted  . Acetaminophen Itching and Rash 05/16/2013  . Penicillins Rash, Shortness Of Breath, and Swelling 05/16/2013   Past Medical History:  Diagnosis Date  . Hypertension      Family History:  Grandfather-hx of MI Brother-DM (died from diabetic complications) Father- Cancer (throat, lungs) Uncle- Prostate cancer Sister-HTN  Social History:  Former 1ppd smoker for 20 yrs, quit 1992.  Typically drinks 1x per week, drank 12 pack yesterday (7/14)  Review of Systems: A complete ROS was negative except as per HPI.   Physical Exam: Blood pressure (!) 160/102, pulse 91, temperature 97.9 F (36.6 C), temperature source Oral, resp. rate 17, height 6' (1.829 m), weight 240 lb (108.9 kg), SpO2 94 %.  Physical Exam  Constitutional: He is well-developed, well-nourished, and in no distress.  HENT:  Head: Normocephalic and atraumatic.  Cardiovascular: Normal rate, regular rhythm, normal heart sounds and intact distal pulses.   Pulmonary/Chest: Effort normal and breath sounds normal. No respiratory distress. He has no wheezes.  Abdominal: Soft. Bowel sounds are normal. He exhibits distension. There is no tenderness.  Skin: No rash noted.  Psychiatric: Memory, affect and judgment normal. His mood appears anxious.   EKG: Normal sinus rhythm, no noted ST segment changes  CXR: No Cardiopulmonary disease, no pneumothorax,   Assessment & Plan by Problem:  NSTEMI (non-ST elevated myocardial infarction) [facility](HCC) The patient is complaining of achy chest pain that is described to be cardiac in nature. He has significant risk factors for heart disease which include DM, htn, and age. EKG on admission did not show any st changes. Chest  x-ray did not show any cardiopulmonary process.  -Troponin 0.05 and then subsequently 0.31 while in ED -Cardiology consulted and they will catheterize him tomorrow 7/16 -Placed on carb modified diet  -Aspirin 81mg , nitroglycerin,  Carvedilol, and atorvastatin given -Placed on Heparin drip  -Given 0.9% nacl drip in preparation for catheterization  Diabetes Mellitus Type II Random blood glucose measurement of 349. Will need diabetes education  with close consideration that the patient is anxious having seen his brother cope with diabetes. -HbA1c ordered -Placed on moderate ssi -On aspirin, atorvastatin  HTN Patient has had longstanding htn for which has not been medically managed. Bp has ranged 124-160/81-102 -carvedilol 6.25mg  bid started. Will consider acei after catheterization is done so as to avoid aki.  Pseudohyponatremia Patient's na=133 on admission when corrected considering hyperglycemia of 349 is na=139 -Will continue bmet to monitor  Dispo: Admit patient to Inpatient with expected length of stay greater than 2 midnights.  SignedLorenso Courier, MD 12/17/2016, 11:02 AM  Pager: Pager: [phone]

## 2016-12-18 ENCOUNTER — Encounter (HOSPITAL_COMMUNITY)
Admission: EM | Disposition: A | Discharge status: Home / Self Care | Payer: Self-pay | Source: Home / Self Care | Attending: Interventional Cardiology

## 2016-12-18 DIAGNOSIS — I2510 Atherosclerotic heart disease of native coronary artery without angina pectoris: Secondary | ICD-10-CM

## 2016-12-18 HISTORY — PX: LEFT HEART CATH AND CORONARY ANGIOGRAPHY: CATH118249

## 2016-12-18 LAB — LIPID PANEL
CHOLESTEROL: 161 mg/dL (ref 0–200)
HDL: 31 mg/dL — AB (ref >40)
LDL Cholesterol: 82 mg/dL (ref 0–99)
Total CHOL/HDL Ratio: 5.2 RATIO
Triglycerides: 238 mg/dL — ABNORMAL HIGH (ref <150)
VLDL: 48 mg/dL — ABNORMAL HIGH (ref 0–40)

## 2016-12-18 LAB — BASIC METABOLIC PANEL
Anion gap: 7 (ref 5–15)
BUN: 14 mg/dL (ref 6–20)
CHLORIDE: 104 mmol/L (ref 101–111)
CO2: 25 mmol/L (ref 22–32)
Calcium: 8.3 mg/dL — ABNORMAL LOW (ref 8.9–10.3)
Creatinine, Ser: 0.83 mg/dL (ref 0.61–1.24)
GFR calc Af Amer: >60 mL/min (ref >60)
GFR calc non Af Amer: >60 mL/min (ref >60)
Glucose, Bld: 206 mg/dL — ABNORMAL HIGH (ref 65–99)
POTASSIUM: 3.8 mmol/L (ref 3.5–5.1)
SODIUM: 136 mmol/L (ref 135–145)

## 2016-12-18 LAB — CBC
HCT: 41.1 % (ref 39.0–52.0)
HCT: 43.2 % (ref 39.0–52.0)
Hemoglobin: 13.7 g/dL (ref 13.0–17.0)
Hemoglobin: 14.2 g/dL (ref 13.0–17.0)
MCH: 29.5 pg (ref 26.0–34.0)
MCH: 29 pg (ref 26.0–34.0)
MCHC: 33.3 g/dL (ref 30.0–36.0)
MCHC: 32.9 g/dL (ref 30.0–36.0)
MCV: 88.4 fL (ref 78.0–100.0)
MCV: 88.3 fL (ref 78.0–100.0)
PLATELETS: 201 10*3/uL (ref 150–400)
PLATELETS: 190 10*3/uL (ref 150–400)
RBC: 4.65 MIL/uL (ref 4.22–5.81)
RBC: 4.89 MIL/uL (ref 4.22–5.81)
RDW: 13.8 % (ref 11.5–15.5)
RDW: 13.8 % (ref 11.5–15.5)
WBC: 9.8 10*3/uL (ref 4.0–10.5)
WBC: 9.4 10*3/uL (ref 4.0–10.5)

## 2016-12-18 LAB — CREATININE, SERUM
Creatinine, Ser: 0.8 mg/dL (ref 0.61–1.24)
GFR calc Af Amer: >60 mL/min (ref >60)
GFR calc non Af Amer: >60 mL/min (ref >60)

## 2016-12-18 LAB — GLUCOSE, CAPILLARY
GLUCOSE-CAPILLARY: 319 mg/dL — AB (ref 65–99)
GLUCOSE-CAPILLARY: 259 mg/dL — AB (ref 65–99)
Glucose-Capillary: 175 mg/dL — ABNORMAL HIGH (ref 65–99)

## 2016-12-18 LAB — POCT ACTIVATED CLOTTING TIME
ACTIVATED CLOTTING TIME: 439 s
Activated Clotting Time: 395 seconds

## 2016-12-18 LAB — PROTIME-INR
INR: 1.09
PROTHROMBIN TIME: 14.1 s (ref 11.4–15.2)

## 2016-12-18 LAB — TSH: TSH: 1.461 u[IU]/mL (ref 0.350–4.500)

## 2016-12-18 LAB — HEPARIN LEVEL (UNFRACTIONATED): HEPARIN UNFRACTIONATED: 0.51 [IU]/mL (ref 0.30–0.70)

## 2016-12-18 SURGERY — LEFT HEART CATH AND CORONARY ANGIOGRAPHY
Anesthesia: LOCAL

## 2016-12-18 MED ORDER — SODIUM CHLORIDE 0.9% FLUSH
3.0000 mL | Freq: Two times a day (BID) | INTRAVENOUS | Status: DC
  Administered 2016-12-18 – 2016-12-20 (×4): 3 mL via INTRAVENOUS

## 2016-12-18 MED ORDER — MIDAZOLAM HCL 2 MG/2ML IJ SOLN
INTRAMUSCULAR | Status: DC | PRN
  Administered 2016-12-18 (×2): 1 mg via INTRAVENOUS

## 2016-12-18 MED ORDER — HEPARIN (PORCINE) IN NACL 2-0.9 UNIT/ML-% IJ SOLN
INTRAMUSCULAR | Status: AC | PRN
  Administered 2016-12-18: 1000 mL

## 2016-12-18 MED ORDER — KETOROLAC TROMETHAMINE 15 MG/ML IJ SOLN
15.0000 mg | Freq: Once | INTRAMUSCULAR | Status: AC
  Administered 2016-12-18: 15 mg via INTRAVENOUS
  Filled 2016-12-18: qty 1

## 2016-12-18 MED ORDER — KETOROLAC TROMETHAMINE 30 MG/ML IJ SOLN
30.0000 mg | Freq: Once | INTRAMUSCULAR | Status: AC
  Administered 2016-12-18: 30 mg via INTRAVENOUS
  Filled 2016-12-18: qty 1

## 2016-12-18 MED ORDER — TIROFIBAN (AGGRASTAT) BOLUS VIA INFUSION
INTRAVENOUS | Status: DC | PRN
  Administered 2016-12-18: 2692.5 ug via INTRAVENOUS

## 2016-12-18 MED ORDER — VERAPAMIL HCL 2.5 MG/ML IV SOLN
INTRAVENOUS | Status: DC | PRN
  Administered 2016-12-18: 10 mL via INTRA_ARTERIAL

## 2016-12-18 MED ORDER — LIDOCAINE HCL (PF) 1 % IJ SOLN
INTRAMUSCULAR | Status: DC | PRN
  Administered 2016-12-18: 2 mL

## 2016-12-18 MED ORDER — HEPARIN SODIUM (PORCINE) 5000 UNIT/ML IJ SOLN
5000.0000 [IU] | Freq: Three times a day (TID) | INTRAMUSCULAR | Status: DC
  Administered 2016-12-18 – 2016-12-20 (×3): 5000 [IU] via SUBCUTANEOUS
  Filled 2016-12-18 (×2): qty 1

## 2016-12-18 MED ORDER — IOPAMIDOL (ISOVUE-370) INJECTION 76%
INTRAVENOUS | Status: AC
  Filled 2016-12-18: qty 200

## 2016-12-18 MED ORDER — INSULIN GLARGINE 100 UNIT/ML ~~LOC~~ SOLN
10.0000 [IU] | Freq: Every day | SUBCUTANEOUS | Status: DC
  Administered 2016-12-18: 10 [IU] via SUBCUTANEOUS
  Filled 2016-12-18 (×2): qty 0.1

## 2016-12-18 MED ORDER — CLOPIDOGREL BISULFATE 75 MG PO TABS
75.0000 mg | ORAL_TABLET | Freq: Every day | ORAL | Status: DC
  Administered 2016-12-19 – 2016-12-20 (×2): 75 mg via ORAL
  Filled 2016-12-18 (×2): qty 1

## 2016-12-18 MED ORDER — HEPARIN SODIUM (PORCINE) 1000 UNIT/ML IJ SOLN
INTRAMUSCULAR | Status: AC
  Filled 2016-12-18: qty 1

## 2016-12-18 MED ORDER — NITROGLYCERIN 1 MG/10 ML FOR IR/CATH LAB
INTRA_ARTERIAL | Status: DC | PRN
  Administered 2016-12-18: 200 ug

## 2016-12-18 MED ORDER — ASPIRIN 81 MG PO CHEW
81.0000 mg | CHEWABLE_TABLET | Freq: Every day | ORAL | Status: DC
  Administered 2016-12-19 – 2016-12-20 (×2): 81 mg via ORAL
  Filled 2016-12-18 (×2): qty 1

## 2016-12-18 MED ORDER — CLOPIDOGREL BISULFATE 300 MG PO TABS
ORAL_TABLET | ORAL | Status: DC | PRN
  Administered 2016-12-18: 600 mg via ORAL

## 2016-12-18 MED ORDER — IOPAMIDOL (ISOVUE-370) INJECTION 76%
INTRAVENOUS | Status: AC
  Filled 2016-12-18: qty 100

## 2016-12-18 MED ORDER — HEPARIN SODIUM (PORCINE) 1000 UNIT/ML IJ SOLN
INTRAMUSCULAR | Status: DC | PRN
  Administered 2016-12-18 (×2): 6000 [IU] via INTRAVENOUS

## 2016-12-18 MED ORDER — SODIUM CHLORIDE 0.9% FLUSH: 3.0000 mL | INTRAVENOUS | Status: DC | PRN

## 2016-12-18 MED ORDER — CLOPIDOGREL BISULFATE 300 MG PO TABS
ORAL_TABLET | ORAL | Status: AC
  Filled 2016-12-18: qty 2

## 2016-12-18 MED ORDER — TIROFIBAN HCL IN NACL 5-0.9 MG/100ML-% IV SOLN
0.1500 ug/kg/min | INTRAVENOUS | Status: AC
  Administered 2016-12-18: 0.15 ug/kg/min via INTRAVENOUS
  Filled 2016-12-18 (×2): qty 100

## 2016-12-18 MED ORDER — FENTANYL CITRATE (PF) 100 MCG/2ML IJ SOLN
INTRAMUSCULAR | Status: AC
  Filled 2016-12-18: qty 2

## 2016-12-18 MED ORDER — ONDANSETRON HCL 4 MG/2ML IJ SOLN: 4.0000 mg | Freq: Four times a day (QID) | INTRAMUSCULAR | Status: DC | PRN

## 2016-12-18 MED ORDER — MORPHINE SULFATE (PF) 2 MG/ML IV SOLN
2.0000 mg | INTRAVENOUS | Status: DC | PRN
  Administered 2016-12-18 – 2016-12-19 (×2): 2 mg via INTRAVENOUS
  Filled 2016-12-18 (×2): qty 1

## 2016-12-18 MED ORDER — MIDAZOLAM HCL 2 MG/2ML IJ SOLN
INTRAMUSCULAR | Status: AC
Start: 2016-12-18 — End: ?
  Filled 2016-12-18: qty 2

## 2016-12-18 MED ORDER — NITROGLYCERIN 1 MG/10 ML FOR IR/CATH LAB
INTRA_ARTERIAL | Status: AC
  Filled 2016-12-18: qty 10

## 2016-12-18 MED ORDER — TIROFIBAN HCL IN NACL 5-0.9 MG/100ML-% IV SOLN
INTRAVENOUS | Status: AC
  Filled 2016-12-18: qty 100

## 2016-12-18 MED ORDER — SODIUM CHLORIDE 0.9 % IV SOLN: 250.0000 mL | INTRAVENOUS | Status: DC | PRN

## 2016-12-18 MED ORDER — TIROFIBAN HCL IN NACL 5-0.9 MG/100ML-% IV SOLN
INTRAVENOUS | Status: AC | PRN
  Administered 2016-12-18: 0.15 ug/kg/min via INTRAVENOUS

## 2016-12-18 MED ORDER — LIDOCAINE HCL (PF) 1 % IJ SOLN
INTRAMUSCULAR | Status: AC
  Filled 2016-12-18: qty 30

## 2016-12-18 MED ORDER — HEPARIN (PORCINE) IN NACL 2-0.9 UNIT/ML-% IJ SOLN
INTRAMUSCULAR | Status: AC
  Filled 2016-12-18: qty 1000

## 2016-12-18 MED ORDER — SODIUM CHLORIDE 0.9 % WEIGHT BASED INFUSION
0.5000 mL/kg/h | INTRAVENOUS | Status: AC
  Administered 2016-12-18: 0.5 mL/kg/h via INTRAVENOUS

## 2016-12-18 MED ORDER — HEPARIN (PORCINE) IN NACL 2-0.9 UNIT/ML-% IJ SOLN
INTRAMUSCULAR | Status: DC | PRN
  Administered 2016-12-18: 12:00:00

## 2016-12-18 MED ORDER — IOPAMIDOL (ISOVUE-370) INJECTION 76%
INTRAVENOUS | Status: DC | PRN
  Administered 2016-12-18: 220 mL via INTRA_ARTERIAL

## 2016-12-18 MED ORDER — LABETALOL HCL 5 MG/ML IV SOLN: 10.0000 mg | INTRAVENOUS | Status: AC | PRN

## 2016-12-18 MED ORDER — VERAPAMIL HCL 2.5 MG/ML IV SOLN
INTRAVENOUS | Status: AC
  Filled 2016-12-18: qty 2

## 2016-12-18 MED ORDER — HYDRALAZINE HCL 20 MG/ML IJ SOLN: 5.0000 mg | INTRAMUSCULAR | Status: AC | PRN

## 2016-12-18 MED ORDER — FENTANYL CITRATE (PF) 100 MCG/2ML IJ SOLN
INTRAMUSCULAR | Status: DC | PRN
  Administered 2016-12-18: 50 ug via INTRAVENOUS

## 2016-12-18 SURGICAL SUPPLY — 20 items
BALLN EMERGE MR 2.0X12 (BALLOONS) ×2
BALLN ~~LOC~~ EUPHORA RX 3.75X12 (BALLOONS) ×2
BALLOON EMERGE MR 2.0X12 (BALLOONS) IMPLANT
BALLOON ~~LOC~~ EUPHORA RX 3.75X12 (BALLOONS) IMPLANT
CATH INFINITI 5 FR JL3.5 (CATHETERS) ×1 IMPLANT
CATH VISTA GUIDE 6FR XB3.5 (CATHETERS) ×1 IMPLANT
COVER PRB 48X5XTLSCP FOLD TPE (BAG) IMPLANT
COVER PROBE 5X48 (BAG) ×2
DEVICE RAD COMP TR BAND LRG (VASCULAR PRODUCTS) ×1 IMPLANT
GLIDESHEATH SLEND A-KIT 6F 22G (SHEATH) ×1 IMPLANT
GUIDEWIRE INQWIRE 1.5J.035X260 (WIRE) IMPLANT
INQWIRE 1.5J .035X260CM (WIRE) ×2
KIT ENCORE 26 ADVANTAGE (KITS) ×1 IMPLANT
KIT HEART LEFT (KITS) ×2 IMPLANT
PACK CARDIAC CATHETERIZATION (CUSTOM PROCEDURE TRAY) ×2 IMPLANT
STENT RESOLUTE ONYX 3.5X12 (Permanent Stent) ×1 IMPLANT
STENT RESOLUTE ONYX 3.5X18 (Permanent Stent) ×1 IMPLANT
TRANSDUCER W/STOPCOCK (MISCELLANEOUS) ×2 IMPLANT
TUBING CIL FLEX 10 FLL-RA (TUBING) ×2 IMPLANT
WIRE ASAHI PROWATER 180CM (WIRE) ×2 IMPLANT

## 2016-12-18 NOTE — Progress Notes (Signed)
TR band removed at this time, dsg applied CDI, no bleeding or hematoma noted. Pt instructed to minimize usage of right arm.

## 2016-12-18 NOTE — Progress Notes (Signed)
Inpatient Diabetes Program Recommendations  AACE/ADA: New Consensus Statement on Inpatient Glycemic Control (2015)  Target Ranges:  Prepandial:   less than 140 mg/dL      Peak postprandial:   less than 180 mg/dL (1-2 hours)      Critically ill patients:  140 - 180 mg/dL   Lab Results  Component Value Date   GLUCAP 319 (H) 12/18/2016    Review of Glycemic Control Results for Connor Reed, Connor Reed (MRN [id number]) as of 12/18/2016 10:17  Ref. Range 12/17/2016 15:52 12/17/2016 21:50 12/18/2016 08:06  Glucose-Capillary Latest Ref Range: 65 - 99 mg/dL 147244 (H) 829263 (H) 562319 (H)   Diabetes history: No prior hx noted  Inpatient Diabetes Program Recommendations:  Noted no prior hx DM and pending A1c. Please consider: -Lantus 20 units daily (0.2 units/kg x 107.7 kg)=21.5 units Will order Living Well with Diabetes for review and will follow.  Thank you, Billy FischerJudy E. Ellee Wawrzyniak, RN, MSN, CDE  Diabetes Coordinator Inpatient Glycemic Control Team Team Pager [phone] (8am-5pm) 12/18/2016 10:29 AM

## 2016-12-18 NOTE — Progress Notes (Signed)
   Subjective: [name] was seen resting in his bed and doing well. He was anxious about his new diabetes diagnosis and diagnosis of nstemi. He states that he continues to have a headache which we explained might be attributed to the nitroglycerin drip he is on. The patient denies sob and cp.   Objective:  Vital signs in last 24 hours: Vitals:   12/17/16 2309 12/18/16 0350 12/18/16 0700 12/18/16 1205  BP: 119/78 112/78    Pulse: 65 63    Resp: 14 16    Temp: 98 F (36.7 C) 98.1 F (36.7 C) 97.9 F (36.6 C)   TempSrc: Oral Oral Oral   SpO2: 94% 95%  99%  Weight:      Height:       Physical Exam  Constitutional: He is well-developed, well-nourished, and in no distress.  HENT:  Head: Normocephalic and atraumatic.  Cardiovascular: Normal rate, regular rhythm, normal heart sounds and intact distal pulses.   Pulmonary/Chest: Breath sounds normal. No respiratory distress. He has no wheezes.  Abdominal: Soft. Bowel sounds are normal. He exhibits no distension. There is no tenderness.  Skin: Skin is warm. No rash noted. No erythema.  Psychiatric: Mood, memory, affect and judgment normal.    Assessment/Plan: NSTEMI (non-ST elevated myocardial infarction) (HCC) The patient has NSTEMI without any acute changes on ekg and an elevation in troponin to 4.1 and subsequently to 6.31.  -Cardiac catheterization on 7/16 with 2 drug-eluting (Zotaralimbus) stent placement. Tirofiban available prn intra operatively.  -Continue IV heparin and nitroglycerin. Heparin level in normal range 0.51 -Placed on carb modified diet  -Aspirin 81mg , nitroglycerin,  Carvedilol, and atorvastatin given -Chest x-ray did not show any cardiopulmonary process.  - Lipid panel: Total cholesterol=161 Triglycerides=238, HDL=31, VLDL=48 -On lipitor 80mg  -TSH normal at 1.461  Diabetes Mellitus Type II Random blood glucose measurement ranging 244-319 over past 24 hrs. Will need diabetes education with close consideration  that the patient is anxious having seen his brother cope with diabetes. -HbA1c ordered -Placed on moderate ssi, added 10u lantus -On aspirin, atorvastatin -will start acei following catheterization  HTN Patient has had longstanding htn for which has not been medically managed. Bp has ranged 112-160/78-102 -carvedilol 6.25mg  bid started. Will consider acei after catheterization is done so as to avoid aki.  Pseudohyponatremia Patient's na=133 on admission when corrected considering hyperglycemia of 349 is na=139 -Will continue bmet to monitor   Dispo: Anticipated discharge in approximately 1 day(s).   Connor CourierVahini Zoe Creasman, MD Internal Medicine PGY1 Pager:[phone] 12/18/2016, 12:46 PM

## 2016-12-18 NOTE — Progress Notes (Signed)
ANTICOAGULATION CONSULT NOTE - Follow Up Consult  Pharmacy Consult for Heparin Indication: chest pain/ACS  Allergies  Allergen Reactions  . Acetaminophen Itching and Rash  . Penicillins Rash, Shortness Of Breath and Swelling    Patient Measurements: Height: 6' (182.9 cm) Weight: 237 lb 7 oz (107.7 kg) IBW/kg (Calculated) : 77.6 Heparin Dosing Weight: 100kg  Vital Signs: Temp: 97.9 F (36.6 C) (07/16 0700) Temp Source: Oral (07/16 0700) BP: 112/78 (07/16 0350) Pulse Rate: 63 (07/16 0350)  Labs:  Recent Labs  12/17/16 0553 12/17/16 1602 12/17/16 2054 12/18/16 0317  HGB 14.4  --   --  13.7  HCT 43.0  --   --  41.1  PLT 215  --   --  201  LABPROT  --   --   --  14.1  INR  --   --   --  1.09  HEPARINUNFRC  --  0.48  --  0.51  CREATININE 0.98  --   --  0.83  TROPONINI  --  4.21* 6.31*  --     Estimated Creatinine Clearance: 124.4 mL/min (by C-G formula based on SCr of 0.83 mg/dL).   Medications:  Heparin @ 1400 units/hr  Assessment: 56yom continues on heparin for NSTEMI with plan for cath today. Heparin level is therapeutic at 0.51. CBC stable. No bleeding.  Goal of Therapy:  Heparin level 0.3-0.7 units/ml Monitor platelets by anticoagulation protocol: Yes   Plan:  1) Continue heparin at 1400 units/hr 2) Follow up after cath  Fredrik RiggerMarkle, Kattie Santoyo Sue 12/18/2016,11:21 AM

## 2016-12-18 NOTE — Progress Notes (Signed)
Progress Note  Patient Name: Connor Reed Date of Encounter: 12/18/2016  Primary Cardiologist: Connor Reed   Subjective   Connor Reed was admitted yesterday with chest pain and non-STEMI. He currently is pain-free on IV heparin and nitroglycerin. His only complaints are of a headache probably related to nitroglycerin. He is scheduled to have cardiac catheterization today.  Inpatient Medications    Scheduled Meds: . [START ON 12/19/2016] aspirin EC  81 mg Oral Daily  . atorvastatin  80 mg Oral q1800  . carvedilol  6.25 mg Oral BID WC  . insulin aspart  0-15 Units Subcutaneous TID WC  . insulin aspart  0-5 Units Subcutaneous QHS  . sodium chloride flush  3 mL Intravenous Q12H   Continuous Infusions: . sodium chloride    . sodium chloride    . heparin 1,400 Units/hr (12/17/16 2000)  . nitroGLYCERIN 5 mcg/min (12/17/16 2000)   PRN Meds: sodium chloride, ALPRAZolam, morphine injection, nitroGLYCERIN, ondansetron (ZOFRAN) IV, sodium chloride flush   Vital Signs    Vitals:   12/17/16 1927 12/17/16 2309 12/18/16 0350 12/18/16 0700  BP: (!) 154/86 119/78 112/78   Pulse: 66 65 63   Resp: 15 14 16    Temp: 97.9 F (36.6 C) 98 F (36.7 C) 98.1 F (36.7 C) 97.9 F (36.6 C)  TempSrc: Oral Oral Oral Oral  SpO2: 93% 94% 95%   Weight:      Height:        Intake/Output Summary (Last 24 hours) at 12/18/16 0819 Last data filed at 12/18/16 1610  Gross per 24 hour  Intake          1412.11 ml  Output                0 ml  Net          1412.11 ml   Filed Weights   12/17/16 0548 12/17/16 1552  Weight: 240 lb (108.9 kg) 237 lb 7 oz (107.7 kg)    Telemetry    Sinus rhythm - Personally Reviewed  ECG    Not performed today - Personally Reviewed  Physical Exam   GEN: No acute distress.   Neck: No JVD Cardiac: RRR, no murmurs, rubs, or gallops.  Respiratory: Clear to auscultation bilaterally. GI: Soft, nontender, non-distended  MS: No edema; No deformity. Neuro:  Nonfocal    Psych: Normal affect   Labs    Chemistry Recent Labs Lab 12/17/16 0553 12/18/16 0317  NA 133* 136  K 4.1 3.8  CL 105 104  CO2 21* 25  GLUCOSE 349* 206*  BUN 11 14  CREATININE 0.98 0.83  CALCIUM 8.5* 8.3*  GFRNONAA >60 >60  GFRAA >60 >60  ANIONGAP 7 7     Hematology Recent Labs Lab 12/17/16 0553 12/18/16 0317  WBC 7.2 9.8  RBC 5.01 4.65  HGB 14.4 13.7  HCT 43.0 41.1  MCV 85.8 88.4  MCH 28.7 29.5  MCHC 33.5 33.3  RDW 13.8 13.8  PLT 215 201    Cardiac Enzymes Recent Labs Lab 12/17/16 1602 12/17/16 2054  TROPONINI 4.21* 6.31*    Recent Labs Lab 12/17/16 0608 12/17/16 0931  TROPIPOC 0.05 0.31*     BNPNo results for input(s): BNP, PROBNP in the last 168 hours.   DDimer  Recent Labs Lab 12/17/16 0553  DDIMER 0.39     Radiology    Dg Chest 2 View  Result Date: 12/17/2016 CLINICAL DATA:  Chest pain beginning at 0300 hours, diaphoresis. History of hypertension. EXAM:  CHEST  2 VIEW COMPARISON:  Chest radiograph December 24, 2005 FINDINGS: Cardiomediastinal silhouette is normal. No pleural effusions or focal consolidations. Trachea projects midline and there is no pneumothorax. Soft tissue planes and included osseous structures are non-suspicious. ACDF. Mild degenerative change of the thoracic spine. IMPRESSION: No active cardiopulmonary disease. Electronically Signed   By: Awilda Metroourtnay  Bloomer M.D.   On: 12/17/2016 06:38    Cardiac Studies   None  Patient Profile     56 y.o. male married Caucasian male admitted yesterday for chest pain and non-STEMI. His troponin peaked at 6.3. His EKG showed no acute changes. He is on IV heparin and nitroglycerin. He does have a history of hypertension and potentially newly diagnosed diabetes. He is scheduled for cardiac catheterization today.  Assessment & Plan    1: Non-STEMI-admitted with non-STEMI with troponin peak of 6. On IV heparin and nitroglycerin for cardiac catheterization today  2: Essential  hypertension-on carvedilol. With new diagnosis of diabetes may benefit from being on an ACE inhibitor as well.  The patient is nothing by mouth for cardiac catheterization today.  Connor SiasSigned, Connor Rauf, MD  12/18/2016, 8:19 AM

## 2016-12-18 NOTE — H&P (View-Only) (Signed)
Progress Note  Patient Name: Connor Reed Date of Encounter: 12/18/2016  Primary Cardiologist: Connor Reed   Subjective   Connor Reed was admitted yesterday with chest pain and non-STEMI. He currently is pain-free on IV heparin and nitroglycerin. His only complaints are of a headache probably related to nitroglycerin. He is scheduled to have cardiac catheterization today.  Inpatient Medications    Scheduled Meds: . [START ON 12/19/2016] aspirin EC  81 mg Oral Daily  . atorvastatin  80 mg Oral q1800  . carvedilol  6.25 mg Oral BID WC  . insulin aspart  0-15 Units Subcutaneous TID WC  . insulin aspart  0-5 Units Subcutaneous QHS  . sodium chloride flush  3 mL Intravenous Q12H   Continuous Infusions: . sodium chloride    . sodium chloride    . heparin 1,400 Units/hr (12/17/16 2000)  . nitroGLYCERIN 5 mcg/min (12/17/16 2000)   PRN Meds: sodium chloride, ALPRAZolam, morphine injection, nitroGLYCERIN, ondansetron (ZOFRAN) IV, sodium chloride flush   Vital Signs    Vitals:   12/17/16 1927 12/17/16 2309 12/18/16 0350 12/18/16 0700  BP: (!) 154/86 119/78 112/78   Pulse: 66 65 63   Resp: 15 14 16    Temp: 97.9 F (36.6 C) 98 F (36.7 C) 98.1 F (36.7 C) 97.9 F (36.6 C)  TempSrc: Oral Oral Oral Oral  SpO2: 93% 94% 95%   Weight:      Height:        Intake/Output Summary (Last 24 hours) at 12/18/16 0819 Last data filed at 12/18/16 1610  Gross per 24 hour  Intake          1412.11 ml  Output                0 ml  Net          1412.11 ml   Filed Weights   12/17/16 0548 12/17/16 1552  Weight: 240 lb (108.9 kg) 237 lb 7 oz (107.7 kg)    Telemetry    Sinus rhythm - Personally Reviewed  ECG    Not performed today - Personally Reviewed  Physical Exam   GEN: No acute distress.   Neck: No JVD Cardiac: RRR, no murmurs, rubs, or gallops.  Respiratory: Clear to auscultation bilaterally. GI: Soft, nontender, non-distended  MS: No edema; No deformity. Neuro:  Nonfocal    Psych: Normal affect   Labs    Chemistry Recent Labs Lab 12/17/16 0553 12/18/16 0317  NA 133* 136  K 4.1 3.8  CL 105 104  CO2 21* 25  GLUCOSE 349* 206*  BUN 11 14  CREATININE 0.98 0.83  CALCIUM 8.5* 8.3*  GFRNONAA >60 >60  GFRAA >60 >60  ANIONGAP 7 7     Hematology Recent Labs Lab 12/17/16 0553 12/18/16 0317  WBC 7.2 9.8  RBC 5.01 4.65  HGB 14.4 13.7  HCT 43.0 41.1  MCV 85.8 88.4  MCH 28.7 29.5  MCHC 33.5 33.3  RDW 13.8 13.8  PLT 215 201    Cardiac Enzymes Recent Labs Lab 12/17/16 1602 12/17/16 2054  TROPONINI 4.21* 6.31*    Recent Labs Lab 12/17/16 0608 12/17/16 0931  TROPIPOC 0.05 0.31*     BNPNo results for input(s): BNP, PROBNP in the last 168 hours.   DDimer  Recent Labs Lab 12/17/16 0553  DDIMER 0.39     Radiology    Dg Chest 2 View  Result Date: 12/17/2016 CLINICAL DATA:  Chest pain beginning at 0300 hours, diaphoresis. History of hypertension. EXAM:  CHEST  2 VIEW COMPARISON:  Chest radiograph December 24, 2005 FINDINGS: Cardiomediastinal silhouette is normal. No pleural effusions or focal consolidations. Trachea projects midline and there is no pneumothorax. Soft tissue planes and included osseous structures are non-suspicious. ACDF. Mild degenerative change of the thoracic spine. IMPRESSION: No active cardiopulmonary disease. Electronically Signed   By: Awilda Metroourtnay  Bloomer M.D.   On: 12/17/2016 06:38    Cardiac Studies   None  Patient Profile     56 y.o. male married Caucasian male admitted yesterday for chest pain and non-STEMI. His troponin peaked at 6.3. His EKG showed no acute changes. He is on IV heparin and nitroglycerin. He does have a history of hypertension and potentially newly diagnosed diabetes. He is scheduled for cardiac catheterization today.  Assessment & Plan    1: Non-STEMI-admitted with non-STEMI with troponin peak of 6. On IV heparin and nitroglycerin for cardiac catheterization today  2: Essential  hypertension-on carvedilol. With new diagnosis of diabetes may benefit from being on an ACE inhibitor as well.  The patient is nothing by mouth for cardiac catheterization today.  Connor SiasSigned, Connor Liendo, MD  12/18/2016, 8:19 AM

## 2016-12-18 NOTE — Interval H&P Note (Signed)
Cath Lab Visit (complete for each Cath Lab visit)  Clinical Evaluation Leading to the Procedure:   ACS: Yes.    Non-ACS:    Anginal Classification: CCS IV  Anti-ischemic medical therapy: Minimal Therapy (1 class of medications)  Non-Invasive Test Results: No non-invasive testing performed  Prior CABG: No previous CABG      History and Physical Interval Note:  12/18/2016 11:50 AM  Connor Reed  has presented today for surgery, with the diagnosis of n stemi  The various methods of treatment have been discussed with the patient and family. After consideration of risks, benefits and other options for treatment, the patient has consented to  Procedure(s): Left Heart Cath and Coronary Angiography (N/A) as a surgical intervention .  The patient's history has been reviewed, patient examined, no change in status, stable for surgery.  I have reviewed the patient's chart and labs.  Questions were answered to the patient's satisfaction.     Lyn RecordsHenry W Lakenya Riendeau III

## 2016-12-19 ENCOUNTER — Encounter (HOSPITAL_COMMUNITY): Payer: Self-pay | Admitting: Interventional Cardiology

## 2016-12-19 DIAGNOSIS — I255 Ischemic cardiomyopathy: Secondary | ICD-10-CM

## 2016-12-19 LAB — BASIC METABOLIC PANEL
ANION GAP: 6 (ref 5–15)
BUN: 11 mg/dL (ref 6–20)
CALCIUM: 8.3 mg/dL — AB (ref 8.9–10.3)
CO2: 22 mmol/L (ref 22–32)
Chloride: 106 mmol/L (ref 101–111)
Creatinine, Ser: 0.78 mg/dL (ref 0.61–1.24)
GFR calc Af Amer: >60 mL/min (ref >60)
GLUCOSE: 131 mg/dL — AB (ref 65–99)
Potassium: 3.5 mmol/L (ref 3.5–5.1)
Sodium: 134 mmol/L — ABNORMAL LOW (ref 135–145)

## 2016-12-19 LAB — CBC
HCT: 41.7 % (ref 39.0–52.0)
Hemoglobin: 13.9 g/dL (ref 13.0–17.0)
MCH: 29.4 pg (ref 26.0–34.0)
MCHC: 33.3 g/dL (ref 30.0–36.0)
MCV: 88.2 fL (ref 78.0–100.0)
PLATELETS: 201 10*3/uL (ref 150–400)
RBC: 4.73 MIL/uL (ref 4.22–5.81)
RDW: 13.5 % (ref 11.5–15.5)
WBC: 8.3 10*3/uL (ref 4.0–10.5)

## 2016-12-19 LAB — GLUCOSE, CAPILLARY
GLUCOSE-CAPILLARY: 180 mg/dL — AB (ref 65–99)
GLUCOSE-CAPILLARY: 235 mg/dL — AB (ref 65–99)
GLUCOSE-CAPILLARY: 200 mg/dL — AB (ref 65–99)
Glucose-Capillary: 150 mg/dL — ABNORMAL HIGH (ref 65–99)
Glucose-Capillary: 211 mg/dL — ABNORMAL HIGH (ref 65–99)

## 2016-12-19 LAB — HEMOGLOBIN A1C
Hgb A1c MFr Bld: 10.3 % — ABNORMAL HIGH (ref 4.8–5.6)
Mean Plasma Glucose: 249 mg/dL

## 2016-12-19 MED ORDER — LISINOPRIL 5 MG PO TABS
5.0000 mg | ORAL_TABLET | Freq: Every day | ORAL | Status: DC
  Administered 2016-12-19 – 2016-12-20 (×2): 5 mg via ORAL
  Filled 2016-12-19 (×2): qty 1

## 2016-12-19 MED ORDER — INSULIN GLARGINE 100 UNIT/ML ~~LOC~~ SOLN
20.0000 [IU] | Freq: Every day | SUBCUTANEOUS | Status: DC
  Administered 2016-12-19: 20 [IU] via SUBCUTANEOUS
  Filled 2016-12-19 (×2): qty 0.2

## 2016-12-19 MED ORDER — INSULIN STARTER KIT- PEN NEEDLES (ENGLISH)
1.0000 | Freq: Once | Status: AC
  Administered 2016-12-19: 1
  Filled 2016-12-19: qty 1

## 2016-12-19 MED ORDER — LIVING WELL WITH DIABETES BOOK
Freq: Once | Status: AC
  Administered 2016-12-19: 10:00:00
  Filled 2016-12-19: qty 1

## 2016-12-19 NOTE — Progress Notes (Signed)
   Subjective: [name] was seen laying in his bed this morning with his wife at bedside. He said that he was doing well and understood the catheterization procedure he underwent yesterday 7/16. He said he understands what diabetes is as he has watched his brother go through it. However, he is concerned whether the medication he has been placed on will affect his kidney as he only has one. We educated him on the medications he will go home with and mentioned that he can follow up with IM in our clinic. He says that he no longer has any headaches and says that the soreness in his chest has also subsided.   Objective:  Vital signs in last 24 hours: Vitals:   12/19/16 0001 12/19/16 0412 12/19/16 0700 12/19/16 0800  BP: 106/63 104/67  136/90  Pulse: 72 74  61  Resp: 17 17  16   Temp: (!) 97.5 F (36.4 C) 97.9 F (36.6 C) 97.8 F (36.6 C)   TempSrc: Oral Oral Oral   SpO2: 96% 98%  96%  Weight:      Height:       Physical Exam  Constitutional: He appears well-developed and well-nourished. No distress.  HENT:  Head: Normocephalic and atraumatic.  Cardiovascular: Normal rate, regular rhythm, normal heart sounds and intact distal pulses.   Pulmonary/Chest: Breath sounds normal. He is in respiratory distress. He has no wheezes.  Abdominal: Soft. Bowel sounds are normal.  Skin: No rash noted. No erythema.  Psychiatric: He has a normal mood and affect. His behavior is normal. Judgment and thought content normal.    Assessment/Plan: NSTEMI (non-ST elevated myocardial infarction) [facility](HCC) The patient has NSTEMI without any acute changes on ekg and an elevation in troponin to 4.1 and subsequently to 6.31.  -Cardiac catheterization on 7/16 with 2 drug-eluting (Zotaralimbus) stent placement. Tirofiban available prn intra operatively.  -Discontinue IV heparin and nitroglycerin.  -Aggrastat will be continued today (7/17) -Dual antiplatelet therapy with aspirin and clopidogrel for 1 year  -Continue  carb modified diet  -Aspirin 81mg ,Carvedilol, Clopidogrel and atorvastatin given -On lipitor 80mg  -TSH normal at 1.461  Diabetes Mellitus Type II Random blood glucose measurement ranging 244-319 over past 24 hrs. Will need diabetes education with close consideration that the patient is anxious having seen his brother cope with diabetes. -HbA1c pending -Random blood sugar has been 131, 150. The patient has completed 100% of his carb modified meal Placed on moderate ssi, added 10u lantus -On aspirin, atorvastatin -started on lisinopril 5mg  qd -Will start metformin at discharge  HTN Patient has had longstanding htn for which has not been medically managed. 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 Opted against diuretic as the patient was euvolemic on exam.  Pseudohyponatremia Patient's na=133 on admission when corrected considering hyperglycemia of 349 is na=139 Na=134 today 12/19/16   Dispo: Anticipated discharge today or in approximately 1 day based on cardiology recommendations.   Connor CourierVahini Charda Janis, MD Internal Medicine PGY1 Pager:[phone] 12/19/2016, 10:37 AM

## 2016-12-19 NOTE — Progress Notes (Signed)
Inpatient Diabetes Program Recommendations  AACE/ADA: New Consensus Statement on Inpatient Glycemic Control (2015)  Target Ranges:  Prepandial:   less than 140 mg/dL      Peak postprandial:   less than 180 mg/dL (1-2 hours)      Critically ill patients:  140 - 180 mg/dL   Lab Results  Component Value Date   GLUCAP 150 (H) 12/19/2016   HGBA1C 10.3 (H) 12/17/2016    Review of Glycemic Control  Diabetes history: New onset Type 2  Inpatient Diabetes Program Recommendations:   Spoke with Connor Reed and wife @ bedside about new diagnosis. Patient shared that he has been drinking 2-3 beers per day and 12-14 on weekends along with drinking Coke and sweet tea. Patient states willingness to limit any drinks with sugar. Patient may benefit from adding Metformin and preferably followup with PCP as soon as possible post D/C. Discussed A1C results with them 10.3 (average CBG 249 over the past 2-3 months) and explained what an A1C is, basic pathophysiology of DM Type 2, basic home care, basic diabetes diet nutrition principles, importance of checking CBGs and maintaining good CBG control to prevent long-term and short-term complications. Reviewed signs and symptoms of hyperglycemia and hypoglycemia and how to treat hypoglycemia at home. Also reviewed blood sugar goals at home.  RNs to provide ongoing basic DM education at bedside with this patient. Have ordered educational booklet, insulin starter kit, and DM videos. Have also placed RD consult for DM diet education for this patient.  Educated patient and spouse on insulin pen use at home. Reviewed contents of insulin flexpen starter kit. Reviewed all steps if insulin pen including attachment of needle, 2-unit air shot, dialing up dose, giving injection, removing needle, disposal of sharps, storage of unused insulin, disposal of insulin etc. Patient able to provide successful return demonstration. Also reviewed troubleshooting with insulin pen. MD to give patient Rxs  for insulin pens and insulin pen needles. Patient will need on discharge: -pen needles if goes home on insulin # [id number] -Glucose meter & strips [id number]  Thank you, Nani Gasser. Deryck Hippler, RN, MSN, CDE  Diabetes Coordinator Inpatient Glycemic Control Team Team Pager [phone] (8am-5pm) 12/19/2016 10:12 AM

## 2016-12-19 NOTE — Progress Notes (Signed)
CARDIAC REHAB PHASE I   PRE:  Rate/Rhythm: 82SR  BP:  Sitting: 130/85        SaO2: 96 RA  MODE:  Ambulation: 680 ft   POST:  Rate/Rhythm: 77 SR  BP:  Sitting: 132/89         SaO2: 97 RA  Pt ambulated 680 ft on RA, independent, steady gait, tolerated well with no complaints. Completed MI/stent education with pt and wife at bedside.  Reviewed risk factors, MI book, anti-platelet therapy, stent card, activity restrictions, ntg, exercise, heart healthy and diabetes diet handouts and phase 2 cardiac rehab. Pt and wife verbalized understanding, receptive to education. Pt agrees to phase 2 cardiac rehab referral, [facility]. Pt to bed per pt request after walk, call bell within reach. Will follow.     [phone] Joylene GrapesEmily C Lekesha Claw, RN, BSN 12/19/2016 2:39 PM

## 2016-12-19 NOTE — Care Management Note (Signed)
Case Management Note  Patient Details  Name: Connor Reed MRN: [id number] Date of Birth: 03/27/1961  Subjective/Objective:     Pt admitted with NSTEMI           Action/Plan:  PTA completely independent from home with wife.  Pt has active health insurance, current PCP relationship and denied barriers with obtaining medications as prescribed.   Expected Discharge Date:                  Expected Discharge Plan:  Home/Self Care  In-House Referral:     Discharge planning Services  CM Consult  Post Acute Care Choice:    Choice offered to:     DME Arranged:    DME Agency:     HH Arranged:    HH Agency:     Status of Service:  In process, will continue to follow  If discussed at Long Length of Stay Meetings, dates discussed:    Additional Comments:  Cherylann ParrClaxton, Aleenah Homen S, RN 12/19/2016, 11:30 AM

## 2016-12-19 NOTE — Progress Notes (Signed)
Requesting block of sleep. VS obtained early, denies pain, eyes closed resting. No acute distress noted.

## 2016-12-19 NOTE — Progress Notes (Signed)
Progress Note  Patient Name: Connor Reed Date of Encounter: 12/19/2016  Primary Cardiologist: Marlou Porch   Subjective   Connor Reed was admitted 2 days ago with chest pain and non-STEMI. He underwent cardiac catheterization yesterday by Dr. Tamala Julian performed radially with PCI and drug-eluting stenting of his mid LAD and AV groove circumflex. He did have moderate LV dysfunction. He is currently on no intravenous medications. He denies chest pain or shortness of breath.  Inpatient Medications    Scheduled Meds: . aspirin  81 mg Oral Daily  . atorvastatin  80 mg Oral q1800  . carvedilol  6.25 mg Oral BID WC  . clopidogrel  75 mg Oral Q breakfast  . heparin  5,000 Units Subcutaneous Q8H  . insulin aspart  0-15 Units Subcutaneous TID WC  . insulin aspart  0-5 Units Subcutaneous QHS  . insulin glargine  10 Units Subcutaneous QHS  . lisinopril  5 mg Oral Daily  . sodium chloride flush  3 mL Intravenous Q12H   Continuous Infusions: . sodium chloride     PRN Meds: sodium chloride, ALPRAZolam, morphine injection, nitroGLYCERIN, ondansetron (ZOFRAN) IV, sodium chloride flush   Vital Signs    Vitals:   12/18/16 2036 12/19/16 0001 12/19/16 0412 12/19/16 0700  BP: (!) 137/98 106/63 104/67   Pulse: 80 72 74   Resp: _0 Temp: 97.7 F (36.5 C) (!) 97.5 F (36.4 C) 97.9 F (36.6 C) 97.8 F (36.6 C)  TempSrc: Oral Oral Oral Oral  SpO2: 95% 96% 98%   Weight:      Height:        Intake/Output Summary (Last 24 hours) at 12/19/16 0911 Last data filed at 12/18/16 2130  Gross per 24 hour  Intake          5768.45 ml  Output                0 ml  Net          5768.45 ml   Filed Weights   12/17/16 0548 12/17/16 1552  Weight: 240 lb (108.9 kg) 237 lb 7 oz (107.7 kg)    Telemetry    Sinus rhythm - Personally Reviewed  ECG    Not performed today - Personally Reviewed  Physical Exam   GEN: No acute distress.   Neck: No JVD Cardiac: RRR, no murmurs, rubs, or gallops.    Respiratory: Clear to auscultation bilaterally. GI: Soft, nontender, non-distended  MS: No edema; No deformity. Neuro:  Nonfocal  Psych: Normal affect   Labs    Chemistry  Recent Labs Lab 12/17/16 0553 12/18/16 0317 12/18/16 1452 12/19/16 0211  NA 133* 136  --  134*  K 4.1 3.8  --  3.5  CL 105 104  --  106  CO2 21* 25  --  22  GLUCOSE 349* 206*  --  131*  BUN 11 14  --  11  CREATININE 0.98 0.83 0.80 0.78  CALCIUM 8.5* 8.3*  --  8.3*  GFRNONAA >60 >60 >60 >60  GFRAA >60 >60 >60 >60  ANIONGAP 7 7  --  6     Hematology  Recent Labs Lab 12/18/16 0317 12/18/16 1452 12/19/16 0211  WBC 9.8 9.4 8.3  RBC 4.65 4.89 4.73  HGB 13.7 14.2 13.9  HCT 41.1 43.2 41.7  MCV 88.4 88.3 88.2  MCH 29.5 29.0 29.4  MCHC 33.3 32.9 33.3  RDW 13.8 13.8 13.5  PLT 201 190 201  Cardiac Enzymes  Recent Labs Lab 12/17/16 1602 12/17/16 2054  TROPONINI 4.21* 6.31*     Recent Labs Lab 12/17/16 0608 12/17/16 0931  TROPIPOC 0.05 0.31*     BNPNo results for input(s): BNP, PROBNP in the last 168 hours.   DDimer   Recent Labs Lab 12/17/16 0553  DDIMER 0.39     Radiology    No results found.  Cardiac Studies   Conclusion    Non-ST elevation anterior wall myocardial infarction with vague ongoing chest pain greater than 24 hours.  Subtotal occlusion thrombotic obstruction in the mid LAD which is also supplied by collaterals from the right coronary.  Successful angioplasty and stenting of the mid LAD from 99.9% with TIMI grade one flow to 0% with TIMI grade 3 flow using Onyx 3.5 x 18 DES postdilated to 3.75 mm in diameter.  Successful direct stenting of the mid circumflex 80% stenosis to 0% with TIMI grade 3 flow using a 3.5 x 12 mm Onyx deployed at 14 atm.  30-40% proximal to mid RCA which is noted above supplied collaterals to the apical and mid LAD.  Left ventricular systolic dysfunction with anteroapical severe hypokinesis, ejection fraction 40%, and elevated  LVEDP consistent with acute combined systolic and diastolic heart failure.  RECOMMENDATIONS:   Aggrastat will be continued for 10 hours.  Aspirin and Plavix 1 year.  Aggressive risk factor modification.  Contemplate discharge within the next 24 hours after guidelines based management of acute combined systolic and diastolic heart failure.     Patient Profile     56 y.o. male married Caucasian male admitted 2 days ago for chest pain and non-STEMI. His troponin peaked at 6.3. He underwent cardiac catheterization yesterday by Dr. Tamala Julian radially revealing a subtotal LAD which was stented using a drug-eluting stent and a high-grade mid AV circumflex which was stented as well. He had moderate LV dysfunction with anteroapical wall motion and only and ejection fraction in the 40% range. He is on appropriate post-MI pharmacology. He is asymptomatic.   Assessment & Plan    1: Non-STEMI-admitted with non-STEMI with troponin peak of 6.31.Postop day one cardiac catheterization performed radially by Dr. Tamala Julian revealing subtotal mid LAD which was stented as well as AV groove circumflex. EF was 40% with an anteroapical wall motion. He was placed on Aggrastat for 10 hours post cath. His EKG shows typical evolution of an anterior MI with ST segment elevation and biphasic T waves.   2: Essential hypertension-on carvedilol. With new diagnosis of diabetes may benefit from being on an ACE inhibitor as well.  3: Ischemic cardiomyopathy-EF was 40% by cardiac catheterization with an elevated LVEDP. He is on low-dose carvedilol by mouth. We'll add low-dose lisinopril as well. He is euvolemic on exam and will not have a diuretic at this time. We will order a 2-D echo as well as baseline and to follow-up in the future.  Transferred to telemetry this morning, cardiac rehabilitation consult, anticipate discharge home tomorrow.   Angelina Sheriff, MD  12/19/2016, 9:11 AM

## 2016-12-20 ENCOUNTER — Telehealth: Payer: Self-pay

## 2016-12-20 ENCOUNTER — Inpatient Hospital Stay (HOSPITAL_COMMUNITY): Payer: Federal, State, Local not specified - PPO

## 2016-12-20 DIAGNOSIS — I2510 Atherosclerotic heart disease of native coronary artery without angina pectoris: Secondary | ICD-10-CM

## 2016-12-20 LAB — BASIC METABOLIC PANEL
Anion gap: 8 (ref 5–15)
BUN: 10 mg/dL (ref 6–20)
CHLORIDE: 104 mmol/L (ref 101–111)
CO2: 24 mmol/L (ref 22–32)
Calcium: 8.8 mg/dL — ABNORMAL LOW (ref 8.9–10.3)
Creatinine, Ser: 0.88 mg/dL (ref 0.61–1.24)
GFR calc non Af Amer: >60 mL/min (ref >60)
Glucose, Bld: 175 mg/dL — ABNORMAL HIGH (ref 65–99)
POTASSIUM: 3.8 mmol/L (ref 3.5–5.1)
SODIUM: 136 mmol/L (ref 135–145)

## 2016-12-20 LAB — ECHOCARDIOGRAM COMPLETE
HEIGHTINCHES: 72 in
Weight: 3798.97 oz

## 2016-12-20 LAB — CBC
HCT: 42.6 % (ref 39.0–52.0)
HEMOGLOBIN: 14.1 g/dL (ref 13.0–17.0)
MCH: 29 pg (ref 26.0–34.0)
MCHC: 33.1 g/dL (ref 30.0–36.0)
MCV: 87.7 fL (ref 78.0–100.0)
Platelets: 205 10*3/uL (ref 150–400)
RBC: 4.86 MIL/uL (ref 4.22–5.81)
RDW: 13.8 % (ref 11.5–15.5)
WBC: 7.3 10*3/uL (ref 4.0–10.5)

## 2016-12-20 LAB — GLUCOSE, CAPILLARY
GLUCOSE-CAPILLARY: 173 mg/dL — AB (ref 65–99)
Glucose-Capillary: 208 mg/dL — ABNORMAL HIGH (ref 65–99)

## 2016-12-20 MED ORDER — INSULIN GLARGINE 100 UNIT/ML SOLOSTAR PEN: 20.0000 [IU] | PEN_INJECTOR | Freq: Every day | SUBCUTANEOUS | 0 refills | Status: DC

## 2016-12-20 MED ORDER — ONETOUCH DELICA LANCING DEV MISC: 1.0000 [IU] | Freq: Two times a day (BID) | 0 refills | Status: DC

## 2016-12-20 MED ORDER — INSULIN PEN NEEDLE 31G X 4 MM MISC: 1.0000 [IU] | Freq: Every day | 0 refills | Status: DC

## 2016-12-20 MED ORDER — LISINOPRIL 5 MG PO TABS: 5.0000 mg | ORAL_TABLET | Freq: Every day | ORAL | 0 refills | Status: DC

## 2016-12-20 MED ORDER — ONETOUCH VERIO W/DEVICE KIT: 1.0000 | PACK | Freq: Once | 0 refills | Status: AC

## 2016-12-20 MED ORDER — CARVEDILOL 6.25 MG PO TABS: 6.2500 mg | ORAL_TABLET | Freq: Two times a day (BID) | ORAL | 0 refills | Status: DC

## 2016-12-20 MED ORDER — ASPIRIN 81 MG PO CHEW: 81.0000 mg | CHEWABLE_TABLET | Freq: Every day | ORAL | 0 refills | Status: DC

## 2016-12-20 MED ORDER — METFORMIN HCL 500 MG PO TABS: ORAL_TABLET | ORAL | 1 refills | Status: DC

## 2016-12-20 MED ORDER — GLUCOSE BLOOD VI STRP: ORAL_STRIP | 0 refills | Status: DC

## 2016-12-20 MED ORDER — ATORVASTATIN CALCIUM 40 MG PO TABS: 40.0000 mg | ORAL_TABLET | Freq: Every day | ORAL | 0 refills | Status: DC

## 2016-12-20 MED ORDER — CLOPIDOGREL BISULFATE 75 MG PO TABS: 75.0000 mg | ORAL_TABLET | Freq: Every day | ORAL | 0 refills | Status: DC

## 2016-12-20 NOTE — Plan of Care (Signed)
Problem: Health Behavior/Discharge Planning: Goal: Ability to manage health-related needs will improve Outcome: Adequate for Discharge Lots of diabetic teaching, booklets, discussed diet modification, insulin, weight loss and triglyceride, LDL?hDL ration improvement. Patient is updated on this and is determined to succeed in this.  Will need glucose meter for home use/  Comments: Understand diabetes, interventions, HPTN control, and diet and exercise elements.  Plan to d/c in AM

## 2016-12-20 NOTE — Progress Notes (Signed)
CARDIAC REHAB PHASE I   Pt in bed, states he wants to walk with his wife, declines ambulation with cardiac rehab at this time, denies any complaints. Pt states he has no questions regarding education at this time, awaiting discharge.   Joylene GrapesEmily C Nicolas Sisler, RN, BSN 12/20/2016 11:48 AM

## 2016-12-20 NOTE — Progress Notes (Signed)
  Echocardiogram 2D Echocardiogram has been performed.  Connor Reed 12/20/2016, 2:20 PM

## 2016-12-20 NOTE — Plan of Care (Signed)
Problem: Food- and Nutrition-Related Knowledge Deficit (NB-1.1) Goal: Nutrition education Formal process to instruct or train a patient/client in a skill or to impart knowledge to help patients/clients voluntarily manage or modify food choices and eating behavior to maintain or improve health. Outcome: Completed/Met Date Met: 12/20/16  RD consulted for nutrition education regarding diabetes.  Pt reports being willing to give up soda and other sugar sweetened beverages.   Lab Results  Component Value Date   HGBA1C 10.3 (H) 12/17/2016    RD provided "Carbohydrate Counting for People with Diabetes" handout from the Academy of [employer]etics. Discussed different food groups and their effects on blood sugar, emphasizing carbohydrate-containing foods. Provided list of carbohydrates and recommended serving sizes of common foods.  Discussed importance of controlled and consistent carbohydrate intake throughout the day. Provided examples of ways to balance meals/snacks and encouraged intake of high-fiber, whole grain complex carbohydrates. Teach back method used.  Expect good compliance.  Body mass index is 32.2 kg/m. Pt meets criteria for obesity class I based on current BMI.  Current diet order is CHO modified, patient is consuming approximately 100% of meals at this time. Labs and medications reviewed. No further nutrition interventions warranted at this time. RD contact information provided. If additional nutrition issues arise, please re-consult RD.  [place], [place], [place] Pager [phone] After Hours Pager

## 2016-12-20 NOTE — Progress Notes (Signed)
 Progress Note  Patient Name: Connor Reed Date of Encounter: 12/20/2016  Primary Cardiologist: Skains   Subjective   [name] was admitted 3 days ago with chest pain and non-STEMI. He underwent cardiac catheterization yesterday by Dr. Smith performed radially with PCI and drug-eluting stenting of his mid LAD and AV groove circumflex. He did have moderate LV dysfunction. He is currently on no intravenous medications. He denies chest pain or shortness of breath. She ambulate with cardiac rehabilitation without limitation.  Inpatient Medications    Scheduled Meds: . aspirin  81 mg Oral Daily  . atorvastatin  80 mg Oral q1800  . carvedilol  6.25 mg Oral BID WC  . clopidogrel  75 mg Oral Q breakfast  . heparin  5,000 Units Subcutaneous Q8H  . insulin aspart  0-15 Units Subcutaneous TID WC  . insulin aspart  0-5 Units Subcutaneous QHS  . insulin glargine  20 Units Subcutaneous QHS  . lisinopril  5 mg Oral Daily  . sodium chloride flush  3 mL Intravenous Q12H   Continuous Infusions: . sodium chloride     PRN Meds: sodium chloride, ALPRAZolam, morphine injection, ondansetron (ZOFRAN) IV, sodium chloride flush   Vital Signs    Vitals:   12/20/16 0300 12/20/16 0545 12/20/16 0550 12/20/16 0729  BP:  122/81    Pulse:   68 71  Resp: 19 12 18   Temp:  98.3 F (36.8 C)  97.8 F (36.6 C)  TempSrc:  Oral  Oral  SpO2:   96% 99%  Weight:      Height:        Intake/Output Summary (Last 24 hours) at 12/20/16 0908 Last data filed at 12/19/16 2200  Gross per 24 hour  Intake              582 ml  Output                0 ml  Net              582 ml   Filed Weights   12/17/16 0548 12/17/16 1552  Weight: 240 lb (108.9 kg) 237 lb 7 oz (107.7 kg)    Telemetry    Sinus rhythm - Personally Reviewed  ECG    Not performed today - Personally Reviewed  Physical Exam   GEN: No acute distress.   Neck: No JVD Cardiac: RRR, no murmurs, rubs, or gallops.  Respiratory: Clear to  auscultation bilaterally. GI: Soft, nontender, non-distended  MS: No edema; No deformity. Neuro:  Nonfocal  Psych: Normal affect   Labs    Chemistry  Recent Labs Lab 12/18/16 0317 12/18/16 1452 12/19/16 0211 12/20/16 0513  NA 136  --  134* 136  K 3.8  --  3.5 3.8  CL 104  --  106 104  CO2 25  --  22 24  GLUCOSE 206*  --  131* 175*  BUN 14  --  11 10  CREATININE 0.83 0.80 0.78 0.88  CALCIUM 8.3*  --  8.3* 8.8*  GFRNONAA >60 >60 >60 >60  GFRAA >60 >60 >60 >60  ANIONGAP 7  --  6 8     Hematology  Recent Labs Lab 12/18/16 1452 12/19/16 0211 12/20/16 0513  WBC 9.4 8.3 7.3  RBC 4.89 4.73 4.86  HGB 14.2 13.9 14.1  HCT 43.2 41.7 42.6  MCV 88.3 88.2 87.7  MCH 29.0 29.4 29.0  MCHC 32.9 33.3 33.1  RDW 13.8 13.5 13.8  PLT 190   201 205    Cardiac Enzymes  Recent Labs Lab 12/17/16 1602 12/17/16 2054  TROPONINI 4.21* 6.31*     Recent Labs Lab 12/17/16 0608 12/17/16 0931  TROPIPOC 0.05 0.31*     BNPNo results for input(s): BNP, PROBNP in the last 168 hours.   DDimer   Recent Labs Lab 12/17/16 0553  DDIMER 0.39     Radiology    No results found.  Cardiac Studies   Conclusion    Non-ST elevation anterior wall myocardial infarction with vague ongoing chest pain greater than 24 hours.  Subtotal occlusion thrombotic obstruction in the mid LAD which is also supplied by collaterals from the right coronary.  Successful angioplasty and stenting of the mid LAD from 99.9% with TIMI grade one flow to 0% with TIMI grade 3 flow using Onyx 3.5 x 18 DES postdilated to 3.75 mm in diameter.  Successful direct stenting of the mid circumflex 80% stenosis to 0% with TIMI grade 3 flow using a 3.5 x 12 mm Onyx deployed at 14 atm.  30-40% proximal to mid RCA which is noted above supplied collaterals to the apical and mid LAD.  Left ventricular systolic dysfunction with anteroapical severe hypokinesis, ejection fraction 40%, and elevated LVEDP consistent with acute  combined systolic and diastolic heart failure.  RECOMMENDATIONS:   Aggrastat will be continued for 10 hours.  Aspirin and Plavix 1 year.  Aggressive risk factor modification.  Contemplate discharge within the next 24 hours after guidelines based management of acute combined systolic and diastolic heart failure.     Patient Profile     56 y.o. male married Caucasian male admitted 2 days ago for chest pain and non-STEMI. His troponin peaked at 6.3. He underwent cardiac catheterization yesterday by Dr. Smith radially revealing a subtotal LAD which was stented using a drug-eluting stent and a high-grade mid AV circumflex which was stented as well. He had moderate LV dysfunction with anteroapical wall motion and only and ejection fraction in the 40% range. He is on appropriate post-MI pharmacology. He is asymptomatic.   Assessment & Plan    1: Non-STEMI-admitted with non-STEMI with troponin peak of 6.31.Postop day one cardiac catheterization performed radially by Dr. Smith revealing subtotal mid LAD which was stented as well as AV groove circumflex. EF was 40% with an anteroapical wall motion. He was placed on Aggrastat for 10 hours post cath. His EKG shows typical evolution of an anterior MI with ST segment elevation and biphasic T waves. He has had no symptoms since his intervention. He is walking with cardiac rehabilitation. He is on appropriate medications including total antibiotic therapy. He is stable for discharge today.  2: Essential hypertension-on carvedilol. With new diagnosis of diabetes may benefit from being on an ACE inhibitor as well. Lisinopril was started yesterday. His blood pressure is under excellent control.  3: Ischemic cardiomyopathy-EF was 40% by cardiac catheterization with an elevated LVEDP. He is on low-dose carvedilol by mouth. We'll add low-dose lisinopril as well. He is euvolemic on exam and will not have a diuretic at this time. A 2-D echocardiogram is pending  this morning.  [name] is stable for discharge home this morning. He will follow-up with Dr. Skains as an outpatient.  Signed, Berry, Jonathan, MD  12/20/2016, 9:08 AM    

## 2016-12-20 NOTE — Discharge Summary (Signed)
Name: Connor Reed MRN: [id number] DOB: April 17, 1961 56 y.o. PCP:   Date of Admission: 12/17/2016  5:34 AM Date of Discharge: 12/20/2016 Attending Physician: Dr. Bryna Colander  Discharge Diagnosis: Principal Problem:   NSTEMI (non-ST elevated myocardial infarction) [facility]) Active Problems:   Type 2 diabetes mellitus ([place])   Hypertension   Discharge Medications: Allergies as of 12/20/2016      Reactions   Acetaminophen Itching, Rash   Penicillins Rash, Shortness Of Breath, Swelling      Medication List    TAKE these medications   aspirin 81 MG chewable tablet Chew 1 tablet (81 mg total) by mouth daily.   atorvastatin 40 MG tablet Commonly known as:  LIPITOR Take 1 tablet (40 mg total) by mouth daily at 6 PM.   carvedilol 6.25 MG tablet Commonly known as:  COREG Take 1 tablet (6.25 mg total) by mouth 2 (two) times daily with a meal.   clopidogrel 75 MG tablet Commonly known as:  PLAVIX Take 1 tablet (75 mg total) by mouth daily with breakfast.   glucose blood test strip Commonly known as:  ONETOUCH VERIO Use as instructed   Insulin Glargine 100 UNIT/ML Solostar Pen Commonly known as:  LANTUS SOLOSTAR Inject 20 Units into the skin daily at 10 pm.   Insulin Pen Needle 31G X 4 MM Misc 1 Units by Does not apply route daily. For use with solostar pen   lisinopril 5 MG tablet Commonly known as:  PRINIVIL,ZESTRIL Take 1 tablet (5 mg total) by mouth daily.   metFORMIN 500 MG tablet Commonly known as:  GLUCOPHAGE Take 1 tab with meals once daily for 1 week, then increase to 1 tab with meals twice daily   ONE TOUCH DELICA LANCING DEV Misc 1 Units by Does not apply route 2 (two) times daily.   ONETOUCH VERIO w/Device Kit 1 kit by Does not apply route once.   PROAIR HFA 108 (90 Base) MCG/ACT inhaler Generic drug:  albuterol Inhale 2 puffs into the lungs 4 (four) times daily as needed for wheezing.       Disposition and follow-up:   Connor Reed was discharged  from [facility] in Good condition.  At the hospital follow up visit please address:  1.  Cardiology follow up, primary care follow up at [facility]  2.  Labs / imaging needed at time of follow-up: glucose check, ekg, diabetes education, nutrition education  3.  Pending labs/ test needing follow-up: none  Follow-up Appointments: Follow-up Information    [place]. Go on 12/27/2016.   Why:  10:45am appointment, Ground floor of [place] [facility] Contact information: [address] [place] [phone]       Burtis Junes, NP Follow up on 01/03/2017.   Specialties:  Nurse Practitioner, Interventional Cardiology, Cardiology, Radiology Why:  at 2pm for your hospital follow up appt.  Contact information: [address] [place] [address] [phone]           Hospital Course by problem list:  NSTEMI (non-ST elevated myocardial infarction) [facility]) The patient presented with chest pain and was worked up for cardiac causes. Ekg did not show any st segment elevation and chest x-ray did not show any cardiopulmonary process. However, troponins trended up and due to the patient having significant risk factors of dm, htn, and due to his age we consulted cardiology for further follow up. Cardiology opted to catheterize him and found  99.9% occlusion in the mid lad and 80% stenosis in the mid circumflex. Two drug eluting (zotaralimbus) stents were placed. He was placed on dual antiplatelet therapy with aspirin and plavix '75mg'$ . He was given carvedilol and atorvastatin at discharge. Cardiac echo showed normal ejection fraction, and normal heart structure. The patient was instructed to follow up with cardiology after discharge.  Type 2 diabetes mellitus ([place]) On admission the patient's blood glucose was 349 and Hba1c was 10.3.  We therefore diagnosed the patient with diabetes and managed accordingly. The patient  has a brother who died of diabetic complications and therefore the diagnosis of diabetes was very anxiety inducing for him. We provided him with diabetes education and discharged him on an regimen of 20u lantus and metformin '500mg'$  1tab for the first week and then increased to one tablet twice a day with meals. He will be following up at the [facility] clinic following discharge for diabetic management.   Hypertension The patient has a history of blood pressure that was not being medically managed at home. During his hospitalization he  was well controlled during the hospitalization with carvedilol 6.'25mg'$  bid. We started acei after the cardiac catheterization was completed with lisinopril '5mg'$  qd.   Discharge Vitals:   BP 118/77 (BP Location: Right Arm)   Pulse 71   Temp 98.3 F (36.8 C) (Oral)   Resp 18   Ht 6' (1.829 m)   Wt 237 lb 7 oz (107.7 kg)   SpO2 100%   BMI 32.20 kg/m   Pertinent Labs, Studies, and Procedures:  Troponin: 0.05 (7/15)> 0.31 (7/15)> 4.21 (7/15)> 6.31 (7/15)  A1c (7/15):10.3  Lipid panel (7/16): tcholes=161, trigl=238, hdl=31, vldl=48  Chest x-ray (7/15): no active cardiopulmonary disease  ECG on admission (7/15): sinus rhythm, no significant change since last   Cardiac cath (7/16): Conclusion    Non-ST elevation anterior wall myocardial infarction with vague ongoing chest pain greater than 24 hours.  Subtotal occlusion thrombotic obstruction in the mid LAD which is also supplied by collaterals from the right coronary.  Successful angioplasty and stenting of the mid LAD from 99.9% with TIMI grade one flow to 0% with TIMI grade 3 flow using Onyx 3.5 x 18 DES postdilated to 3.75 mm in diameter.  Successful direct stenting of the mid circumflex 80% stenosis to 0% with TIMI grade 3 flow using a 3.5 x 12 mm Onyx deployed at 14 atm.  30-40% proximal to mid RCA which is noted above supplied collaterals to the apical and mid LAD.  Left ventricular systolic  dysfunction with anteroapical severe hypokinesis, ejection fraction 40%, and elevated LVEDP consistent with acute combined systolic and diastolic heart failure.  RECOMMENDATIONS:   Aggrastat will be continued for 10 hours.  Aspirin and Plavix 1 year.  Aggressive risk factor modification.  Contemplate discharge within the next 24 hours after guidelines based management of acute combined systolic and diastolic heart failure.   Echo (7/18):  Ef=55-60%, normal left ventricular wall thickness,  Right ventricle wall thickness normal, left atrium normal in size, no aortic stenosis   Discharge Instructions: Discharge Instructions    (HEART FAILURE PATIENTS) Call MD:  Anytime you have any of the following symptoms: 1) 3 pound weight gain in 24 hours or 5 pounds in 1 week 2) shortness of breath, with or without a dry hacking cough 3) swelling in the hands, feet or stomach 4) if you have to sleep on extra pillows at night in order to breathe.    Complete  by:  As directed    Amb Referral to Cardiac Rehabilitation    Complete by:  As directed    Diagnosis:   Coronary Stents NSTEMI     Ambulatory referral to Nutrition and Diabetic Education    Complete by:  As directed    New onset   Call MD for:  difficulty breathing, headache or visual disturbances    Complete by:  As directed    Call MD for:  persistant dizziness or light-headedness    Complete by:  As directed    Diet - low sodium heart healthy    Complete by:  As directed    Discharge instructions    Complete by:  As directed    Thank you for allowing [place] to be a part of your care! You were admitted to [facility] with chest pain that was found to be NSTEMI (Non ST segment Myocardial Infarction). You were also diagnosed with Diabetes Mellitus Type 2. You have been prescribed medication to manage your diabetes and heart condition. Please make sure that you are compliant with your medications and go to all of your follow up  appointments.  INTRODUCTION - Type 2 diabetes mellitus is a disorder that disrupts the way your body uses glucose (sugar).  All the cells in your body need sugar to work normally. Sugar gets into the cells with the help of a hormone called insulin. If there is not enough insulin or if the body stops responding to insulin, sugar builds up in the blood. This is what happens to people with diabetes mellitus.  There are two different types of diabetes mellitus. In type 1 diabetes mellitus, the problem is that the pancreas (an organ in the abdomen) does not make enough insulin. In type 2 diabetes mellitus, the pancreas does not make enough insulin (figure 1), the body becomes resistant to normal or even high levels of insulin, or both. This causes high blood glucose (blood sugar) levels, which can cause problems if untreated. In the [place], [place], and [place], approximately 71 percent of all people with diabetes have type 2 diabetes.  Type 2 diabetes is a chronic medical condition that requires regular monitoring and treatment throughout your life. Treatment includes lifestyle changes, self-care measures, and sometimes medications. Fortunately, these treatments can keep blood sugar levels close to normal and minimize the risk of developing complications.  THE IMPACT OF DIABETES - Being diagnosed with type 2 diabetes can be a frightening and overwhelming experience, and you likely have questions about why it developed, what it means for your long-term health, and how it will affect your everyday life.  For most people, the first few months after being diagnosed are filled with emotional highs and lows. If you have just been diagnosed with diabetes, you and your family should use this time to learn as much as possible so that caring for your diabetes (including testing your blood sugar, going to medical appointments, and taking your medications) becomes a part of your daily routine. (See "Patient  education: Self-monitoring of blood glucose in diabetes mellitus (Beyond the Basics)".)  In addition, you should talk to your doctor or nurse about resources that are available for medical as well as psychological support. These may include group classes; meetings with a registered dietitian, social worker, or Sports coach; and other educational resources such as books, websites, or magazines. Several of these resources are listed below. (See 'Where to get more information' below.)  Despite the risks associated with type  2 diabetes, most people can lead active lives and continue to enjoy the foods and activities that they previously enjoyed. Diabetes does not mean an end to "special occasion" foods like birthday cake, and most people with diabetes can enjoy exercise in almost any form. (See "Patient education: Type 2 diabetes mellitus and diet (Beyond the Basics)" and "Patient education: Diabetes mellitus type 2: Alcohol, exercise, and medical care (Beyond the Basics)".)  CAUSES OF TYPE 2 DIABETES - Type 2 diabetes is thought to be caused by a combination of genetic and environmental factors. (See "Pathogenesis of type 2 diabetes mellitus" and "Risk factors for type 2 diabetes mellitus".)  Genetic causes - Many people with type 2 diabetes have a family member with either type 2 diabetes or other medical problems associated with diabetes, such as high cholesterol levels, high blood pressure, or obesity.  The lifetime risk of developing type 2 diabetes is 5 to 10 times higher in first-degree relatives (sister, brother, son, daughter) of a person with diabetes compared with a person with no family history of diabetes.  The likelihood of developing type 2 diabetes is greater in certain ethnic groups, such as people of Hispanic, African, and Asian descent.  Environmental conditions - Environmental factors such as what you eat and how active you are, combined with genetic causes, affect the risk of  developing type 2 diabetes.  Pregnancy - A small number (approximately 3 to 5 percent) of pregnant women develop diabetes during pregnancy, called "gestational diabetes." Gestational diabetes is similar to type 2 diabetes, but it usually resolves after the woman delivers her baby. Women who have gestational diabetes are at increased risk for developing type 2 diabetes later in life. (See "Patient education: Gestational diabetes mellitus (Beyond the Basics)".)  TYPE 2 DIABETES DIAGNOSIS - The diagnosis of diabetes is based upon your symptoms and the results of blood tests. (See "Clinical presentation and diagnosis of diabetes mellitus in adults".)  Symptoms - Before being diagnosed with type 2 diabetes, most people have no symptoms at all. In those who do have symptoms, the most common include:  -Needing to urinate frequently  -Feeling thirsty  -Blurred vision   Laboratory tests - Several blood tests are used to measure blood glucose levels, the primary test for diagnosing diabetes.  -Random blood sugar test - For a random blood sugar test, you can have blood drawn at any time throughout the day, regardless of when you last ate. If your blood sugar is 200 mg/dL (11.1 mmol/L) or higher and you have symptoms of high blood sugar (see 'Symptoms' above), it is likely that you have diabetes.   -Fasting blood sugar test - A fasting blood sugar test is a blood test done after not eating or drinking for 8 to 12 hours (usually overnight). A normal fasting blood sugar level is less than 100 mg/dL (5.55 mmol/L).   -Hemoglobin A1C test - The "A1C" blood test measures your average blood sugar level over the past two to three months. Normal values for A1C are 4 to 5.6 percent. The A1C test can be done at any time of day (before or after eating).   -Oral glucose tolerance test - Oral glucose tolerance testing (OGTT) is a test that involves drinking a special glucose solution (usually orange or cola  flavored). Your blood sugar level is tested before you drink the solution and then again one and two hours after drinking it.   Criteria for diagnosis - The following criteria are used to classify your  blood sugar levels as normal, increased risk (blood sugar levels that are higher than normal and indicate a risk of future diabetes), or diabetes.  Normal - Fasting blood sugar less than 100 mg/dL (5.55 mmol/L).  Categories of increased risk  -Impaired fasting glucose is defined as a fasting blood sugar level between 100 and 125 mg/dL (5.6 to 6.9 mmol/L).   -Impaired glucose tolerance is defined as a blood sugar level of 140 to 199 mg/dL two hours after an OGTT.   -A1C - Persons with 5.7 to 6.4 percent are at highest risk, although there is a continuum of increasing risk across the entire spectrum of subdiabetic A1C levels.   At least 50 percent of people with impaired glucose tolerance eventually develop type 2 diabetes. Even if they do not develop diabetes, these people are at increased risk of heart disease. Impaired glucose tolerance is very common; approximately 11 percent of all people between the ages of 78 and 67 have impaired glucose tolerance.  Diabetes mellitus - A person is considered to be diabetic if he or she has one or more of the following:  -Symptoms of diabetes (see 'Symptoms' above) and a random blood sugar of 200 mg/dL (11.1 mmol/L) or higher   -A fasting blood sugar level of 126 mg/dL (7.0 mmol/L) or higher   -A blood sugar of 200 mg/dL (11.1 mmol/L) or higher two hours after an OGTT   -An A1C of 6.5 percent or higher   HEART ATTACK SYMPTOMS - The "typical" symptoms of a heart attack include:  Chest pain  -Chest pain or discomfort (pressure, tightness, or squeezing)  -Pain spreads through the chest and others areas of the body, including the upper abdomen, shoulders, arms, neck and throat, or lower jaw and teeth.  -Pain comes on gradually and lasts more  than a few seconds   Other symptoms  -Shortness of breath  -Nausea, vomiting, or belching  -Sweating  -Palpitations (skipped heart beats)  -Lightheadedness  -Feeling tired  -Fainting   HEART ATTACK DIAGNOSIS - If you have chest pain that is new, severe, prolonged, or if chest pain causes concern, call 911 immediately. The emergency medical services (EMS) personnel in your community are prepared to respond rapidly, and will take you to the nearest hospital. For a patient having a heart attack, every minute is important. Remember, the faster you get to a hospital, the sooner you can receive treatment.  Although not everyone with chest pain is having a heart attack, you may be treated for a heart attack until testing can be done to determine the cause of your symptoms.  HEART ATTACK TREATMENT - You will be given oxygen through a flexible plastic tube that rests beneath the nose or by a face mask, and an electrocardiogram (EKG) will be performed as quickly as possible. The EKG gives a picture of the flow of electrical activity that causes the heart to beat. Damaged areas usually show an abnormal pattern. The EKG may be repeated.  Blood is drawn and sent to the laboratory to look for substances in the blood that are released by damaged heart tissue (cardiac enzymes or proteins). An intravenous line (IV) is started so that medicines can be given directly into the veins. Nitroglycerin is given either through the IV or under the tongue to relieve chest pain. Morphine may also be given to help relieve chest pain and ease your anxiety. You will be given aspirin to chew and swallow to help stop new blood clots from  forming.  There are different types of myocardial infarction (MI), based on what is seen on the EKG. The two main types are called ST elevation MI (STEMI) and non-ST elevation MI (NSTEMI). Your treatment will depend upon the type of MI you have.  Treatment of non-ST elevation heart  attack - People with non-ST elevation MI are treated with drugs, including aspirin that help prevent new blood clots. A medication called a beta blocker may also be given to slow the heart and decrease the heart's demand for oxygen, and drugs such as statins are used to lower the cholesterol.  Following this, two approaches to treatment are possible: intensive medical therapy and early catheterization.  Intensive medical therapy - Your condition will be stabilized with medications. If your symptoms do not return, exercise testing is performed. In this test, you exercise on a treadmill or bicycle, and the heart's response is examined with an EKG. The exercise test can indicate if your coronary arteries are narrowed or blocked.  Further treatment decisions are based upon the results of the exercise test.  -You may be discharged on medicines that control your symptoms.   -You may need one or more procedures, including coronary artery stenting or surgery (explained below).   Early catheterization - With this approach, you are taken for a cardiac catheterization within the first hours or days of being in the hospital. A small plastic tube (catheter) is threaded through a blood vessel (artery), usually in the groin, to the coronary arteries. A dye is injected that allows the arteries to be seen on X-ray.  If blockages or narrowings are found, a procedure known as percutaneous coronary intervention (also known as stenting) may be done. With this procedure, a tiny catheter with a balloon at the end of it is advanced into the narrowed coronary artery. The balloon is then inflated, which helps open up the narrowed artery. A stent (an expandable metal tube) is placed in the artery to prevent the narrowing from recurring. (See "Patient education: Stenting for the heart (Beyond the Basics)".)  In some cases, the x-ray reveals that the blockages cannot be opened with stenting. In these instances, coronary artery  bypass graft surgery (CABG) may be an option. During the CABG operation (often pronounced "cabbage"), a blood vessel (vein or artery) is taken from the leg or the chest and used as a detour around the blocked coronary artery.   Increase activity slowly    Complete by:  As directed       Signed: Lars Mage, MD Internal Medicine PGY1 Pager:[phone] 12/20/2016, 4:10 PM

## 2016-12-20 NOTE — Telephone Encounter (Signed)
Hospital TOC per Dr Delma Officerhundi,, discharge 12/20/2016, appt 12/27/2016 @ 10:45.

## 2016-12-20 NOTE — Progress Notes (Signed)
   Subjective: Connor Reed was seen resting in Connor bed this morning with Connor Reed. He denied any sob or chest pain. We went over Connor discharge medications and both he and Connor wife expressed understanding. The patient was also informed about the possible adverse effects of the medication.   Objective:  Vital signs in last 24 hours: Vitals:   12/20/16 0545 12/20/16 0550 12/20/16 0729 12/20/16 1202  BP: 122/81   118/77  Pulse:  68 71 71  Resp: 12 18    Temp: 98.3 F (36.8 C)  97.8 F (36.6 C) 98.3 F (36.8 C)  TempSrc: Oral  Oral Oral  SpO2:  96% 99% 100%  Weight:      Height:       Physical Exam  Constitutional: He appears well-developed and well-nourished. No distress.  HENT:  Head: Normocephalic and atraumatic.  Cardiovascular: Normal rate, regular rhythm, normal heart sounds and intact distal pulses.   No murmur heard. Pulmonary/Chest: Breath sounds normal. No respiratory distress. He has no wheezes.  Abdominal: Soft. Bowel sounds are normal. There is no tenderness.  Musculoskeletal:  No pitting noted bilaterally  Psychiatric: He has a normal mood and affect. Connor behavior is normal. Judgment and thought content normal.    Assessment/Plan: NSTEMI (non-ST elevated myocardial infarction) (HCC) -Cardiac catherization done on 7/16 showed 99.9% occlusion in the mid LAD and mid circumflex with 80% stenosis. He got 2 drug-eluting (Zotaralimbus) stent placements.  -Discontinue IV heparin and nitroglycerin.  -Dual antiplatelet therapy with aspirin and clopidogrel for 1 year  -Continue carb modified diet  -Carvedilol and atorvastatin 80mg   -Was placed on aggrastat for 10 hrs post cath. -Cardiac echo pending, anticipated to be done later today 7/18 prior to discharge   Diabetes Mellitus Type II Random blood glucose measurement ranging 173-208 over past 24 hrs.  -HbA1c 10.3 -20u lantus and ssi -on lisinopril 5mg  qd, atorvastatin 80mg , aspirin 81 - Will be discharged on  20u lantus and metformin 500mg  1 tab for the the first week and then increase to 1 tab twice a day with meals.  -[facility]. Will receive diabetes education and nutritionist support   HTN Bp has ranged 112-136/61-90 over the past 24 hrs -carvedilol 6.25mg  bid and lisinopril 5mg  qd  Acute combined systolic and diasolic heart failure -Echo to be done today 7/18 -Continue with carvedilol 6.25mg  bid, lisinopril 5 mg.    Dispo: Anticipated discharge today  Lorenso CourierVahini Nikai Quest, MD Internal Medicine PGY1 [phone] 12/20/2016, 1:47 PM

## 2016-12-22 ENCOUNTER — Other Ambulatory Visit: Payer: Self-pay | Admitting: *Deleted

## 2016-12-22 ENCOUNTER — Telehealth (HOSPITAL_COMMUNITY): Payer: Self-pay

## 2016-12-22 MED ORDER — NITROGLYCERIN 0.4 MG SL SUBL: 0.4000 mg | SUBLINGUAL_TABLET | SUBLINGUAL | 3 refills | Status: DC | PRN

## 2016-12-22 NOTE — Telephone Encounter (Signed)
Patient insurance is active and benefits verified. Patient insurance is BCBS - no co-payment, deductible $350/$107.45 has been met, out of pocket $5000/$579.27 has been met, 15% co-insurance, no pre-authorization and no limit on visit. Passport/reference [id number].  Patient will be contacted and scheduled after their follow up appointment with the cardiologist office on 01/03/17, upon review by [facility] RN navigator.

## 2016-12-25 ENCOUNTER — Telehealth: Payer: Self-pay | Admitting: Cardiology

## 2016-12-25 NOTE — Telephone Encounter (Signed)
New message   Wife calling  Patient was discharge from hospital on Wednesday of last week. Wife checking on status of blood pressure machine.

## 2016-12-25 NOTE — Telephone Encounter (Signed)
I spoke with pt who is asking about a prescription for BP cuff that he can submit to insurance.  He is seeing primary care this Wednesday and I asked him to check at this appointment about possibly getting a prescription.

## 2016-12-27 ENCOUNTER — Ambulatory Visit (INDEPENDENT_AMBULATORY_CARE_PROVIDER_SITE_OTHER): Payer: Federal, State, Local not specified - PPO | Admitting: Internal Medicine

## 2016-12-27 ENCOUNTER — Encounter: Payer: Self-pay | Admitting: Internal Medicine

## 2016-12-27 VITALS — BP 114/71 | HR 66 | Temp 97.9°F | Ht 72.0 in | Wt 230.5 lb

## 2016-12-27 DIAGNOSIS — I214 Non-ST elevation (NSTEMI) myocardial infarction: Secondary | ICD-10-CM | POA: Diagnosis not present

## 2016-12-27 DIAGNOSIS — I10 Essential (primary) hypertension: Secondary | ICD-10-CM | POA: Diagnosis not present

## 2016-12-27 DIAGNOSIS — E119 Type 2 diabetes mellitus without complications: Secondary | ICD-10-CM

## 2016-12-27 DIAGNOSIS — E1100 Type 2 diabetes mellitus with hyperosmolarity without nonketotic hyperglycemic-hyperosmolar coma (NKHHC): Secondary | ICD-10-CM

## 2016-12-27 MED ORDER — METFORMIN HCL 500 MG PO TABS: ORAL_TABLET | ORAL | 1 refills | Status: DC

## 2016-12-27 MED ORDER — CARVEDILOL 6.25 MG PO TABS: 6.2500 mg | ORAL_TABLET | Freq: Two times a day (BID) | ORAL | 2 refills | Status: DC

## 2016-12-27 MED ORDER — ASPIRIN 81 MG PO CHEW: 81.0000 mg | CHEWABLE_TABLET | Freq: Every day | ORAL | 2 refills | Status: DC

## 2016-12-27 MED ORDER — CLOPIDOGREL BISULFATE 75 MG PO TABS: 75.0000 mg | ORAL_TABLET | Freq: Every day | ORAL | 2 refills | Status: DC

## 2016-12-27 MED ORDER — ATORVASTATIN CALCIUM 40 MG PO TABS: 40.0000 mg | ORAL_TABLET | Freq: Every day | ORAL | 2 refills | Status: DC

## 2016-12-27 MED ORDER — LISINOPRIL 5 MG PO TABS: 5.0000 mg | ORAL_TABLET | Freq: Every day | ORAL | 2 refills | Status: DC

## 2016-12-27 NOTE — Assessment & Plan Note (Addendum)
The patient was discouraged about his recent NSTEMI and surprised at the amount of medication he now has to take. He denies any current symptoms of chest pain and SOB with exertion, stating that he feels well and doesn't understand the need for medications. He was instructed that this medication regimen saves lives in people who have ACS and he was strongly encouraged to keep up with this regimen. Per chart review he will need dual antiplatelet therapy for at least 1 year after stent placement (he had two stents placed). He was given refills for his medications today, as he only had a 30 day supply. He has an appointment scheduled with Cardiologist and is anxious to get started with cardiac rehabilitation. He was instructed to keep this appointment and to follow up in 4-8 weeks with his PCP for evaluation of BP and medication compliance.  Plan:  Continue current regimen of: -Aspirin 81 mg daily -Clopidogrel 74 mg daily -Atorvastatin 40 mg daily Follow up with cardiologist as previously scheduled.

## 2016-12-27 NOTE — Progress Notes (Signed)
   CC: Diabetes and NSTEMI follow up  HPI:  Connor Reed is a 56 y.o. with a PMH of HTN who was recently hospitalized from 12/17/16 - 7/18/218 for an NSTEMI. Per chart review he had two drug eluting stents placed and was started on clopidogrel and aspirin during hospitalization. Also during this hospitalization was started on carvedilol 6.25 mg BID and lisinopril 5 mg daily for HTN and atorvastatin 40 mg. He was also diagnosed with T2DM during hospitalization with an A1C of 10.3%. He was started on metformin 500 mg once a day with instructions to escalate this dose after 1 week and instructed to start taking 20 units of Lantus every evening.   Since he left the hospital the patient has felt well and has no acute complaints. He does, however, have many concerns regarding his medications and how to take them each day. He filled all prescriptions since leaving the hospital but has had difficulty completing the insulin regimen prescribed at the time of discharge. The patient states that he has been giving himself 2 units of Lantus every time he sees a BG reading above 140. He states this is how he was told to take his insulin by a nurse prior to leaving the hospital. Otherwise the patient has been able to take his medications as prescribed even though he expresses frustration that he has to take so many.  The patient feels well and denies any symptoms which initially brought him to the hospital with his NSTEMI. He is anxious to get to cardiac rehab and wants to start exercising. He states he knows what foods he needs to eat to maintain a heart health and diabetic friendly diet - his wife is helping him implement these changes at home. He endorsed drinking 12-16 cans of beer per day for the past 6-8 months, but has completely stopped drinking since his hospitalization without difficulty.  Past Medical History:  Diagnosis Date  . Asthma   . Chronic bronchitis (HCC)   . Hypertension    Review of  Systems:   Patient endorses blurred vision for 2 days Patient denies chest pain, shortness of breath, abdominal pain, diaphoresis, nausea/vomiting, lower extremity swelling, and change in bowel/bladder habits.  Physical Exam:  Vitals:   12/27/16 1126  BP: 114/71  Pulse: 66  Temp: 97.9 F (36.6 C)  TempSrc: Oral  SpO2: 98%  Weight: 230 lb 8 oz (104.6 kg)  Height: 6' (1.829 m)   Physical Exam  Constitutional: He appears well-developed and well-nourished. No distress.  Cardiovascular: Normal rate, regular rhythm, normal heart sounds and intact distal pulses.  Exam reveals no friction rub.   No murmur heard. Pulmonary/Chest: Effort normal and breath sounds normal. No respiratory distress. He has no wheezes.  No crackles appreciated.  Abdominal: Soft. He exhibits no distension. There is no tenderness. There is no guarding.  Musculoskeletal: He exhibits no edema (of bilateral lower extremities) or tenderness (of bilateral lower extremities).  Skin: Skin is warm and dry. Capillary refill takes less than 2 seconds. No erythema. No pallor.  Psychiatric: His behavior is normal. Judgment and thought content normal.   Assessment & Plan:   See Encounters Tab for problem based charting.  Patient seen with Dr. Oswaldo DoneVincent.

## 2016-12-27 NOTE — Assessment & Plan Note (Addendum)
The patient was discharged with instructions to start taking metformin 500 mg daily and to increase to 500 mg BID after 1 week. The patient was also told to take 20 units of Lantus each evening after discharge. He was confused by different instructions given during hospitalization by nurses and doctors and has not been taking Lantus as prescribed. Glucometer shows highest BG reading since discharge to be 180. The patient was told to discontinue insulin altogether. He was instructed to continue escalating metformin over the next three weeks until he reaches maximum dose of 1000 mg BID. He was told to look out for diarrhea and abdominal discomfort. He will call if this becomes bothersome and we can adjust his dose of metformin at that time. Patient will follow up in 4-6 weeks for DM management and med compliance; he will call sooner if needed.  Plan: -STOP insulin, lantus 20 units nightly -Continue taking BG at home and bring log to next visit -Start taking 2 500 mg metformin tablets per day and slowly increase to 1000 mg BID as instructed during visit over the next 3 weeks. -Reassess symptoms of hypoglycemia/abdominal discomfort at follow up

## 2016-12-27 NOTE — Patient Instructions (Addendum)
Thank you for visiting us today!  Please take your medications as previously prescribed and keep your upcoming appointment with your cardiologist. Please see your eye doctor for a diabetic eye exam.  Please come to see us again in 1-2 months for follow up of your diabetes and hypertension.

## 2016-12-27 NOTE — Assessment & Plan Note (Addendum)
BP at goal today of <140/90. Plan to continue current regimen as outlined below. The patient was instructed to look out for symptoms of hypotension on this regimen before his next visit. He will follow up in 4-8 weeks with his PCP for evaluation of BP and medication compliance.  Plan: -Continue carvedilol 6.25 BID and and lisinopril 5 mg daily.

## 2016-12-28 NOTE — Progress Notes (Signed)
Internal Medicine Clinic Attending  I saw and evaluated the patient.  I personally confirmed the key portions of the history and exam documented by Dr. Nedrud and I reviewed pertinent patient test results.  The assessment, diagnosis, and plan were formulated together and I agree with the documentation in the resident's note.  

## 2017-01-03 ENCOUNTER — Encounter (INDEPENDENT_AMBULATORY_CARE_PROVIDER_SITE_OTHER): Payer: Self-pay

## 2017-01-03 ENCOUNTER — Encounter: Payer: Self-pay | Admitting: Nurse Practitioner

## 2017-01-03 ENCOUNTER — Ambulatory Visit (INDEPENDENT_AMBULATORY_CARE_PROVIDER_SITE_OTHER): Payer: Federal, State, Local not specified - PPO | Admitting: Nurse Practitioner

## 2017-01-03 VITALS — BP 100/60 | HR 60 | Ht 72.0 in | Wt 239.0 lb

## 2017-01-03 DIAGNOSIS — I214 Non-ST elevation (NSTEMI) myocardial infarction: Secondary | ICD-10-CM | POA: Diagnosis not present

## 2017-01-03 DIAGNOSIS — Z955 Presence of coronary angioplasty implant and graft: Secondary | ICD-10-CM

## 2017-01-03 DIAGNOSIS — E1100 Type 2 diabetes mellitus with hyperosmolarity without nonketotic hyperglycemic-hyperosmolar coma (NKHHC): Secondary | ICD-10-CM | POA: Diagnosis not present

## 2017-01-03 DIAGNOSIS — I10 Essential (primary) hypertension: Secondary | ICD-10-CM | POA: Diagnosis not present

## 2017-01-03 MED ORDER — CARVEDILOL 6.25 MG PO TABS: 6.2500 mg | ORAL_TABLET | Freq: Two times a day (BID) | ORAL | 6 refills | Status: DC

## 2017-01-03 MED ORDER — FLUTICASONE-SALMETEROL 100-50 MCG/DOSE IN AEPB: 1.0000 | INHALATION_SPRAY | Freq: Two times a day (BID) | RESPIRATORY_TRACT | 0 refills | Status: DC

## 2017-01-03 MED ORDER — CLOPIDOGREL BISULFATE 75 MG PO TABS: 75.0000 mg | ORAL_TABLET | Freq: Every day | ORAL | 11 refills | Status: DC

## 2017-01-03 MED ORDER — LISINOPRIL 5 MG PO TABS: 5.0000 mg | ORAL_TABLET | Freq: Every day | ORAL | 11 refills | Status: DC

## 2017-01-03 MED ORDER — ATORVASTATIN CALCIUM 40 MG PO TABS: 40.0000 mg | ORAL_TABLET | Freq: Every day | ORAL | 6 refills | Status: DC

## 2017-01-03 NOTE — Progress Notes (Signed)
CARDIOLOGY OFFICE NOTE  Date:  01/03/2017    Connor Reed Date of Birth: 08-Mar-1961 Medical Record #[id number]  PCP:  Thomasene Ripple, MD  Cardiologist:  Marlou Porch ([facility])  Chief Complaint  Patient presents with  . Coronary Artery Disease    Post hospital visit - seen for Dr. Marlou Porch    History of Present Illness: Connor Reed is a 56 y.o. male who presents today for a post hospital visit. Seen for Dr. Marlou Porch.   He has a history of HTN and uncontrolled DM. Previously no known CAD.  Presented mid July with chest pain - troponins +. He was referred for cardiac cath and found to have 99% occlusion of the mid LAD and 80% stenosis in mid LCX. Two DES stents were placed. DAPT therapy initiated. Moderate LV dysfunction at time of cath but echo 2 days later with normal EF noted.   Comes in today. Here with his wife. Doing ok. Home a couple of weeks - no real issue/problems. Upset that he was not really given any instructions at discharge. Says he has taken it upon himself to stop drinking, start losing weight and eat better. He was drinking a significant amount of beer - daily - has now stopped. No chest pain but his left side hurts when he takes a deep breath or moves a certain way - he feels this is muscular or something with his lung - used to be on Advair in the remote past which has helped him - he is asking for this today. No chest pain - his chest pain syndrome was more back pain and left arm pain. He has not had this. Feels a little sluggish. Trying to do some walking - sounds like its sporadic - also trying to lift weights.  Little sluggish - he thinks this is the metformin. Seeing PCP later this month - very hesitant to take more metformin given the dramatic change he has made with his diet. Not really dizzy or lightheaded. He notes that his goal is to not be on any medicines long term.   Past Medical History:  Diagnosis Date  . Asthma   . Chronic bronchitis ([place])   . Hypertension       Past Surgical History:  Procedure Laterality Date  . LEFT HEART CATH AND CORONARY ANGIOGRAPHY N/A 12/18/2016   Procedure: Left Heart Cath and Coronary Angiography;  Surgeon: Belva Crome, MD;  Location: [facility];  Service: Cardiovascular;  Laterality: N/A;     Medications: Current Meds  Medication Sig  . aspirin 81 MG chewable tablet Chew 1 tablet (81 mg total) by mouth daily.  Marland Kitchen atorvastatin (LIPITOR) 40 MG tablet Take 1 tablet (40 mg total) by mouth daily at 6 PM.  . carvedilol (COREG) 6.25 MG tablet Take 1 tablet (6.25 mg total) by mouth 2 (two) times daily with a meal.  . clopidogrel (PLAVIX) 75 MG tablet Take 1 tablet (75 mg total) by mouth daily with breakfast.  . glucose blood (ONETOUCH VERIO) test strip Use as instructed  . [employer] (ONE TOUCH DELICA LANCING DEV) MISC 1 Units by Does not apply route 2 (two) times daily.  Marland Kitchen lisinopril (PRINIVIL,ZESTRIL) 5 MG tablet Take 1 tablet (5 mg total) by mouth daily.  . metFORMIN (GLUCOPHAGE) 500 MG tablet Week 2: 500 mg tablet twice a day Week 3: 500 mg in morning & 1000 mg at night Week 4: 1000 mg tablet twice a day  . nitroGLYCERIN (  NITROSTAT) 0.4 MG SL tablet Place 1 tablet (0.4 mg total) under the tongue every 5 (five) minutes as needed for chest pain.  Marland Kitchen PROAIR HFA 108 (90 Base) MCG/ACT inhaler Inhale 2 puffs into the lungs 4 (four) times daily as needed for wheezing.   . [DISCONTINUED] atorvastatin (LIPITOR) 40 MG tablet Take 1 tablet (40 mg total) by mouth daily at 6 PM.  . [DISCONTINUED] carvedilol (COREG) 6.25 MG tablet Take 1 tablet (6.25 mg total) by mouth 2 (two) times daily with a meal.  . [DISCONTINUED] clopidogrel (PLAVIX) 75 MG tablet Take 1 tablet (75 mg total) by mouth daily with breakfast.  . [DISCONTINUED] lisinopril (PRINIVIL,ZESTRIL) 5 MG tablet Take 1 tablet (5 mg total) by mouth daily.     Allergies: Allergies  Allergen Reactions  . Acetaminophen Itching and Rash  . Penicillins Rash,  Shortness Of Breath and Swelling    Social History: The patient  reports that he quit smoking about 26 years ago. He has a 20.00 pack-year smoking history. He has never used smokeless tobacco. He reports that he drinks alcohol. He reports that he does not use drugs.   Family History: The patient's family history includes Cancer in his father; Diabetes in his brother; Heart attack in his maternal grandfather.   Review of Systems: Please see the history of present illness.   Otherwise, the review of systems is positive for none.   All other systems are reviewed and negative.   Physical Exam: VS:  BP 100/60 (BP Location: Left Arm, Patient Position: Sitting, Cuff Size: Normal)   Pulse 60   Ht 6' (1.829 m)   Wt 239 lb (108.4 kg)   BMI 32.41 kg/m  .  BMI Body mass index is 32.41 kg/m.  Wt Readings from Last 3 Encounters:  01/03/17 239 lb (108.4 kg)  12/27/16 230 lb 8 oz (104.6 kg)  12/17/16 237 lb 7 oz (107.7 kg)    General: Pleasant. Obese male who is alert and in no acute distress.   HEENT: Normal.  Neck: Supple, no JVD, carotid bruits, or masses noted.  Cardiac: Regular rate and rhythm. No murmurs, rubs, or gallops. No edema.  Respiratory:  Lungs are clear to auscultation bilaterally with normal work of breathing.  GI: Soft and nontender.  MS: No deformity or atrophy. Gait and ROM intact.  Skin: Warm and dry. Color is normal.  Neuro:  Strength and sensation are intact and no gross focal deficits noted.  Psych: Alert, appropriate and with normal affect.   LABORATORY DATA:  EKG:  EKG is not ordered today.  Lab Results  Component Value Date   WBC 7.3 12/20/2016   HGB 14.1 12/20/2016   HCT 42.6 12/20/2016   PLT 205 12/20/2016   GLUCOSE 175 (H) 12/20/2016   CHOL 161 12/18/2016   TRIG 238 (H) 12/18/2016   HDL 31 (L) 12/18/2016   LDLCALC 82 12/18/2016   NA 136 12/20/2016   K 3.8 12/20/2016   CL 104 12/20/2016   CREATININE 0.88 12/20/2016   BUN 10 12/20/2016   CO2 24  12/20/2016   TSH 1.461 12/18/2016   INR 1.09 12/18/2016   HGBA1C 10.3 (H) 12/17/2016     BNP (last 3 results) No results for input(s): BNP in the last 8760 hours.  ProBNP (last 3 results) No results for input(s): PROBNP in the last 8760 hours.   Other Studies Reviewed Today:  Echo Study Conclusions 12/2016  - Left ventricle: The cavity size was normal. Wall thickness was  normal. Systolic function was normal. The estimated ejection   fraction was in the range of 55% to 60%. Left ventricular   diastolic function parameters were normal.   Left Heart Cath and Coronary Angiography 12/2016  Conclusion    Non-ST elevation anterior wall myocardial infarction with vague ongoing chest pain greater than 24 hours.  Subtotal occlusion thrombotic obstruction in the mid LAD which is also supplied by collaterals from the right coronary.  Successful angioplasty and stenting of the mid LAD from 99.9% with TIMI grade one flow to 0% with TIMI grade 3 flow using Onyx 3.5 x 18 DES postdilated to 3.75 mm in diameter.  Successful direct stenting of the mid circumflex 80% stenosis to 0% with TIMI grade 3 flow using a 3.5 x 12 mm Onyx deployed at 14 atm.  30-40% proximal to mid RCA which is noted above supplied collaterals to the apical and mid LAD.  Left ventricular systolic dysfunction with anteroapical severe hypokinesis, ejection fraction 40%, and elevated LVEDP consistent with acute combined systolic and diastolic heart failure.  RECOMMENDATIONS:   Aggrastat will be continued for 10 hours.  Aspirin and Plavix 1 year.  Aggressive risk factor modification.  Contemplate discharge within the next 24 hours after guidelines based management of acute combined systolic and diastolic heart failure.      Assessment/Plan:  1: Non-STEMI- recent NSTEMI with troponin peak of 6.31. Cardiac cath revealed subtotal mid LAD which was stented as well as AV groove circumflex. Now s/p PCI x 2 with  DES - EF was 40% with an anteroapical wall motion. EF however was normal by echo. He is on beta blocker and ACE along with being on DAPT - I think he is doing ok - will refer to cardiac rehab. Baseline labs today.   2: Essential hypertension- - on Coreg and ACE - BP is actually soft. For now will monitor - he is not sympmtomatic.   3: Ischemic cardiomyopathy-EF was 40% by cardiac catheterization with an elevated LVEDP. On ACE and beta blocker. EF by echo was normal. Would continue with his current regimen.  4. HLD - now on statin - check LFTs today. He has stopped drinking. Fasting labs on return  5. Obesity - seems motivated at this time to continue with the lifestyle changes he has made just in the past 2 weeks - hopefully he will be able to continue long term.   6. ?pleurisy - he would like a one time RX for Advair until he can get to PCP - he has used this in the past.   Current medicines are reviewed with the patient today.  The patient does not have concerns regarding medicines other than what has been noted above.  The following changes have been made:  See above.  Labs/ tests ordered today include:    Orders Placed This Encounter  Procedures  . Basic metabolic panel  . CBC  . Hepatic function panel     Disposition:   FU with me or Dr. Marlou Porch in 4 to 6 weeks with fasting labs.    Patient is agreeable to this plan and will call if any problems develop in the interim.   SignedTruitt Merle, NP  01/03/2017 2:24 PM  [facility] [address] [place], [place]  [address] Phone: [phone] Fax: [phone]

## 2017-01-03 NOTE — Patient Instructions (Addendum)
We will be checking the following labs today - BMET, HPF & CBC   Medication Instructions:    Continue with your current medicines.   I refilled your Coreg, Plavix, Lisinopril and Lipitor  I would stay on just twice a day Metformin until you see your PCP - continue to check your blood sugars  Will give you a one time RX for Advair    Testing/Procedures To Be Arranged:  N/A  Follow-Up:   See Dr. Anne FuSkains me in 4 to 6 weeks with fasting labs    Other Special Instructions:   I sent a message to cardiac rehab.   Keep up the good work with lifestyle changes    If you need a refill on your cardiac medications before your next appointment, please call your pharmacy.   Call the [facility] office at [phone] if you have any questions, problems or concerns.

## 2017-01-04 LAB — BASIC METABOLIC PANEL
BUN/Creatinine Ratio: 14 (ref 9–20)
BUN: 14 mg/dL (ref 6–24)
CO2: 21 mmol/L (ref 20–29)
Calcium: 9.8 mg/dL (ref 8.7–10.2)
Chloride: 100 mmol/L (ref 96–106)
Creatinine, Ser: 1.03 mg/dL (ref 0.76–1.27)
GFR calc Af Amer: 93 mL/min/{1.73_m2} (ref >59)
GFR calc non Af Amer: 80 mL/min/{1.73_m2} (ref >59)
Glucose: 114 mg/dL — ABNORMAL HIGH (ref 65–99)
Potassium: 4.8 mmol/L (ref 3.5–5.2)
Sodium: 136 mmol/L (ref 134–144)

## 2017-01-04 LAB — HEPATIC FUNCTION PANEL
ALT: 30 IU/L (ref 0–44)
AST: 16 IU/L (ref 0–40)
Albumin: 4.8 g/dL (ref 3.5–5.5)
Alkaline Phosphatase: 76 IU/L (ref 39–117)
Bilirubin Total: 1.2 mg/dL (ref 0.0–1.2)
Bilirubin, Direct: 0.35 mg/dL (ref 0.00–0.40)
Total Protein: 7.4 g/dL (ref 6.0–8.5)

## 2017-01-04 LAB — CBC
Hematocrit: 43.3 % (ref 37.5–51.0)
Hemoglobin: 15.1 g/dL (ref 13.0–17.7)
MCH: 30 pg (ref 26.6–33.0)
MCHC: 34.9 g/dL (ref 31.5–35.7)
MCV: 86 fL (ref 79–97)
Platelets: 330 10*3/uL (ref 150–379)
RBC: 5.03 x10E6/uL (ref 4.14–5.80)
RDW: 13.9 % (ref 12.3–15.4)
WBC: 10.8 10*3/uL (ref 3.4–10.8)

## 2017-01-05 NOTE — Telephone Encounter (Signed)
[facility].Criss AlvineGoldston, Darlene Cassady8/3/201811:27 AM

## 2017-01-08 ENCOUNTER — Other Ambulatory Visit: Payer: Self-pay

## 2017-01-08 ENCOUNTER — Other Ambulatory Visit: Payer: Self-pay | Admitting: *Deleted

## 2017-01-08 NOTE — Telephone Encounter (Signed)
Requesting all meds to be filled @ [facility] on [address].

## 2017-01-08 NOTE — Telephone Encounter (Signed)
Called [facility], informed the pharmacist lori gerhardt has done his refills, verified each with pharm and they will notify gerhardt NP about the refills she did also spoke w/ pt's sig other and she will speak to pharm and gerhardt NP office

## 2017-01-09 ENCOUNTER — Telehealth: Payer: Self-pay | Admitting: Nurse Practitioner

## 2017-01-09 NOTE — Telephone Encounter (Signed)
S/w pt let pt know d/c coreg was noted in pt's chart.  Pt did not feel that should be on medication anyway.  Stated is going out to get bp cuff, explained what type and brand.  Stated if bp starts trending upward give office a call.  Medication list updated.

## 2017-01-09 NOTE — Telephone Encounter (Signed)
New Message    Pt c/o medication issue:  1. Name of Medication: carvedilol (COREG) 6.25 MG tablet  2. How are you currently taking this medication (dosage and times per day) 6.25MG   3. Are you having a reaction (difficulty breathing--STAT)? Difficulty breathig  4. What is your medication issue? Pt states that he feels down and cannot function on this medication, breathing hard and multiple issues. Pt states he did not take it this morning and he feels great.

## 2017-01-09 NOTE — Telephone Encounter (Signed)
Noted. Will stop for now.

## 2017-01-11 ENCOUNTER — Telehealth (HOSPITAL_COMMUNITY): Payer: Self-pay | Admitting: Pharmacist

## 2017-01-11 NOTE — Telephone Encounter (Signed)
Cardiac Rehab Medication Review by a Pharmacist  Does the patient  feel that his/her medications are working for him/her?  yes  Has the patient been experiencing any side effects to the medications prescribed?  Yes  Headache, occasional dizziness  Does the patient measure his/her own blood pressure or blood glucose at home?  yes   Check BP, says that it is "normal"  Checks BG, says it is usually 110-130s  Does the patient have any problems obtaining medications due to transportation or finances?   no  Understanding of regimen: good Understanding of indications: fair Potential of compliance: fair   Pharmacist comments: Pt is a [age] yoM presenting for cardiac rehab orientation. He used his medication bottles to provide information. He asked about the indications for many of his medications, which I provided. Of note, he has been using his Advair inhaler PRN rather than BID. I counseled the patient that this medication only works if used consistently and that he should only use his ProAir inhaler PRN. He has been working on exercise and improving his diet and is hopeful to improve his health and be able to stop taking some of his medications.   Roderic ScarceErin N. Zigmund Danieleja, PharmD PGY1 Pharmacy Resident Pager: [phone]  01/11/2017 12:47 PM

## 2017-01-15 DIAGNOSIS — K080 Exfoliation of teeth due to systemic causes: Secondary | ICD-10-CM | POA: Diagnosis not present

## 2017-01-16 ENCOUNTER — Encounter (HOSPITAL_COMMUNITY): Payer: Self-pay

## 2017-01-16 ENCOUNTER — Encounter (HOSPITAL_COMMUNITY)
Admission: RE | Admit: 2017-01-16 | Discharge: 2017-01-16 | Disposition: A | Discharge status: Home / Self Care | Payer: Federal, State, Local not specified - PPO | Source: Ambulatory Visit | Attending: Cardiology | Admitting: Cardiology

## 2017-01-16 VITALS — BP 100/60 | HR 73 | Ht 71.5 in | Wt 228.2 lb

## 2017-01-16 DIAGNOSIS — I214 Non-ST elevation (NSTEMI) myocardial infarction: Secondary | ICD-10-CM | POA: Diagnosis not present

## 2017-01-16 DIAGNOSIS — Z955 Presence of coronary angioplasty implant and graft: Secondary | ICD-10-CM | POA: Diagnosis not present

## 2017-01-16 NOTE — Progress Notes (Signed)
Connor Reed 56 y.o. male DOB: 02/20/1961 MRN: [id number]      Nutrition Note  1. NSTEMI (non-ST elevated myocardial infarction) (HCC)   2. Stented coronary artery    Past Medical History:  Diagnosis Date  . Asthma   . Chronic bronchitis (HCC)   . Hypertension    Meds reviewed. Metformin noted  HT: Ht Readings from Last 1 Encounters:  01/16/17 5' 11.5" (1.816 m)    WT: Wt Readings from Last 3 Encounters:  01/16/17 228 lb 2.8 oz (103.5 kg)  01/03/17 239 lb (108.4 kg)  12/27/16 230 lb 8 oz (104.6 kg)     BMI 31.4   Current tobacco use? No  Labs:  Lipid Panel     Component Value Date/Time   CHOL 161 12/18/2016 0317   TRIG 238 (H) 12/18/2016 0317   HDL 31 (L) 12/18/2016 0317   CHOLHDL 5.2 12/18/2016 0317   VLDL 48 (H) 12/18/2016 0317   LDLCALC 82 12/18/2016 0317    Lab Results  Component Value Date   HGBA1C 10.3 (H) 12/17/2016   CBG (last 3)  No results for input(s): GLUCAP in the last 72 hours.  Nutrition Note Spoke with pt. Nutrition plan and goals reviewed with pt. Pt is following Step 1 of the Therapeutic Lifestyle Changes diet. Pt wants to lose wt. Pt is diabetic. Last A1c indicates blood glucose poorly controlled. Pt expressed understanding of the information reviewed. Pt aware of nutrition education classes offered and plans on attending nutrition classes.  Nutrition Diagnosis ? Food-and nutrition-related knowledge deficit related to lack of exposure to information as related to diagnosis of: ? CVD ? DM ? Obesity related to excessive energy intake as evidenced by a BMI of 31.4  Nutrition Intervention ? Pt's individual nutrition plan and goals reviewed with pt.  Nutrition Goal(s):  ? Pt to identify and limit food sources of saturated fat, trans fat, and sodium ? Improved blood glucose control as evidenced by pt's A1c trending toward 7.0 or less. ? Pt to identify food quantities necessary to achieve weight loss of 6-24 lb (2.7-10.9 kg) at graduation from  cardiac rehab. Goal wt of 200 lb desired.  Plan:  Pt to attend nutrition classes ? Nutrition I ? Nutrition II ? Portion Distortion ? Diabetes Blitz ? Diabetes Q & A Will provide client-centered nutrition education as part of interdisciplinary care.   Monitor and evaluate progress toward nutrition goal with team.  Mickle PlumbEdna Mikita Lesmeister, M.Ed, RD, LDN, CDE 01/16/2017 3:50 PM

## 2017-01-16 NOTE — Progress Notes (Signed)
Cardiac Individual Treatment Plan  Patient Details  Name: Connor Reed MRN: [id number] Date of Birth: Apr 09, 1961 Referring Provider:     CARDIAC REHAB PHASE II ORIENTATION from 01/16/2017 in [facility] CARDIAC REHAB  Referring Provider  Donato Schultz, MD.      Initial Encounter Date:    CARDIAC REHAB PHASE II ORIENTATION from 01/16/2017 in [facility] CARDIAC REHAB  Date  01/16/17  Referring Provider  Donato Schultz, MD.      Visit Diagnosis: NSTEMI (non-ST elevated myocardial infarction) [facility])  Stented coronary artery  Patient's Home Medications on Admission:  Current Outpatient Prescriptions:  .  aspirin 81 MG chewable tablet, Chew 1 tablet (81 mg total) by mouth daily., Disp: 30 tablet, Rfl: 2 .  atorvastatin (LIPITOR) 40 MG tablet, Take 1 tablet (40 mg total) by mouth daily at 6 PM., Disp: 30 tablet, Rfl: 6 .  clopidogrel (PLAVIX) 75 MG tablet, Take 1 tablet (75 mg total) by mouth daily with breakfast., Disp: 30 tablet, Rfl: 11 .  Fluticasone-Salmeterol (ADVAIR DISKUS) 100-50 MCG/DOSE AEPB, Inhale 1 puff into the lungs 2 (two) times daily., Disp: 60 each, Rfl: 0 .  glucose blood (ONETOUCH VERIO) test strip, Use as instructed, Disp: 100 each, Rfl: 0 .  Lancet Devices (ONE TOUCH DELICA LANCING DEV) MISC, 1 Units by Does not apply route 2 (two) times daily., Disp: 100 each, Rfl: 0 .  lisinopril (PRINIVIL,ZESTRIL) 5 MG tablet, Take 1 tablet (5 mg total) by mouth daily., Disp: 30 tablet, Rfl: 11 .  metFORMIN (GLUCOPHAGE) 500 MG tablet, Week 2: 500 mg tablet twice a day Week 3: 500 mg in morning & 1000 mg at night Week 4: 1000 mg tablet twice a day (Patient taking differently: 500 mg 2 (two) times daily with a meal. Week 2: 500 mg tablet twice a day Week 3: 500 mg in morning & 1000 mg at night Week 4: 1000 mg tablet twice a day), Disp: 60 tablet, Rfl: 1 .  nitroGLYCERIN (NITROSTAT) 0.4 MG SL tablet, Place 1 tablet (0.4 mg total) under the tongue every 5  (five) minutes as needed for chest pain., Disp: 25 tablet, Rfl: 3 .  PROAIR HFA 108 (90 Base) MCG/ACT inhaler, Inhale 2 puffs into the lungs 4 (four) times daily as needed for wheezing. , Disp: , Rfl: 0  Past Medical History: Past Medical History:  Diagnosis Date  . Asthma   . Chronic bronchitis (HCC)   . Hypertension     Tobacco Use: History  Smoking Status  . Former Smoker  . Packs/day: 1.00  . Years: 20.00  . Quit date: 06/19/1990  Smokeless Tobacco  . Never Used    Labs: Recent Review Flowsheet Data    Labs for ITP Cardiac and Pulmonary Rehab Latest Ref Rng & Units 12/17/2016 12/18/2016   Cholestrol 0 - 200 mg/dL - 213   LDLCALC 0 - 99 mg/dL - 82   HDL >08 mg/dL - 65(H)   Trlycerides <846 mg/dL - 962(X)   Hemoglobin B2W 4.8 - 5.6 % 10.3(H) -      Capillary Blood Glucose: Lab Results  Component Value Date   GLUCAP 208 (H) 12/20/2016   GLUCAP 173 (H) 12/20/2016   GLUCAP 200 (H) 12/19/2016   GLUCAP 235 (H) 12/19/2016   GLUCAP 211 (H) 12/19/2016     Exercise Target Goals: Date: 01/16/17  Exercise Program Goal: Individual exercise prescription set with THRR, safety & activity barriers. Participant demonstrates ability to understand and report RPE  using BORG scale, to self-measure pulse accurately, and to acknowledge the importance of the exercise prescription.  Exercise Prescription Goal: Starting with aerobic activity 30 plus minutes a day, 3 days per week for initial exercise prescription. Provide home exercise prescription and guidelines that participant acknowledges understanding prior to discharge.  Activity Barriers & Risk Stratification:     Activity Barriers & Cardiac Risk Stratification - 01/16/17 1135      Activity Barriers & Cardiac Risk Stratification   Activity Barriers Arthritis;Other (comment)   Comments Left hip pain   Cardiac Risk Stratification High      6 Minute Walk:     6 Minute Walk    Row Name 01/16/17 1134         6 Minute  Walk   Phase Initial     Distance 1636 feet     Walk Time 6 minutes     # of Rest Breaks 0     MPH 3.1     METS 4.04     RPE 11     VO2 Peak 14.13     Symptoms No     Resting HR 73 bpm     Resting BP 100/60     Max Ex. HR 120 bpm     Max Ex. BP 112/60     2 Minute Post BP 110/62        Oxygen Initial Assessment:   Oxygen Re-Evaluation:   Oxygen Discharge (Final Oxygen Re-Evaluation):   Initial Exercise Prescription:     Initial Exercise Prescription - 01/16/17 1100      Date of Initial Exercise RX and Referring Provider   Date 01/16/17   Referring Provider Donato Schultz, MD.     Treadmill   MPH 3.4   Grade 0   Minutes 10   METs 3.6     Bike   Level 1.7   Minutes 10   METs 4.13     NuStep   Level 4   SPM 95   Minutes 10   METs 3.3     Prescription Details   Frequency (times per week) 3   Duration Progress to 30 minutes of continuous aerobic without signs/symptoms of physical distress     Intensity   THRR 40-80% of Max Heartrate 65-131   Ratings of Perceived Exertion 11-13   Perceived Dyspnea 0-4     Progression   Progression Continue to progress workloads to maintain intensity without signs/symptoms of physical distress.     Resistance Training   Training Prescription Yes   Weight 4lbs   Reps 10-15      Perform Capillary Blood Glucose checks as needed.  Exercise Prescription Changes:   Exercise Comments:   Exercise Goals and Review:      Exercise Goals    Row Name 01/16/17 1135             Exercise Goals   Increase Physical Activity Yes       Intervention Provide advice, education, support and counseling about physical activity/exercise needs.;Develop an individualized exercise prescription for aerobic and resistive training based on initial evaluation findings, risk stratification, comorbidities and participant's personal goals.       Expected Outcomes Achievement of increased cardiorespiratory fitness and enhanced  flexibility, muscular endurance and strength shown through measurements of functional capacity and personal statement of participant.       Increase Strength and Stamina Yes       Intervention Provide advice, education, support and counseling about physical activity/exercise  needs.;Develop an individualized exercise prescription for aerobic and resistive training based on initial evaluation findings, risk stratification, comorbidities and participant's personal goals.       Expected Outcomes Achievement of increased cardiorespiratory fitness and enhanced flexibility, muscular endurance and strength shown through measurements of functional capacity and personal statement of participant.          Exercise Goals Re-Evaluation :    Discharge Exercise Prescription (Final Exercise Prescription Changes):   Nutrition:  Target Goals: Understanding of nutrition guidelines, daily intake of sodium 1500mg , cholesterol 200mg , calories 30% from fat and 7% or less from saturated fats, daily to have 5 or more servings of fruits and vegetables.  Biometrics:     Pre Biometrics - 01/16/17 1137      Pre Biometrics   Height 5' 11.5" (1.816 m)   Weight 228 lb 2.8 oz (103.5 kg)   Waist Circumference 44.75 inches   Hip Circumference 41 inches   Waist to Hip Ratio 1.09 %   BMI (Calculated) 31.4   Triceps Skinfold 23 mm   % Body Fat 32.2 %   Grip Strength 49 kg   Flexibility 8 in   Single Leg Stand 30 seconds       Nutrition Therapy Plan and Nutrition Goals:   Nutrition Discharge: Nutrition Scores:   Nutrition Goals Re-Evaluation:   Nutrition Goals Re-Evaluation:   Nutrition Goals Discharge (Final Nutrition Goals Re-Evaluation):   Psychosocial: Target Goals: Acknowledge presence or absence of significant depression and/or stress, maximize coping skills, provide positive support system. Participant is able to verbalize types and ability to use techniques and skills needed for reducing  stress and depression.  Initial Review & Psychosocial Screening:   Quality of Life Scores:     Quality of Life - 01/16/17 1145      Quality of Life Scores   Health/Function Pre 27.2 %   Socioeconomic Pre 28.79 %   Psych/Spiritual Pre 27.43 %   Family Pre 22.8 %   GLOBAL Pre 26.93 %      PHQ-9: Recent Review Flowsheet Data    Depression screen [facility] 2/9 12/27/2016   Decreased Interest 0   Down, Depressed, Hopeless 0   PHQ - 2 Score 0     Interpretation of Total Score  Total Score Depression Severity:  1-4 = Minimal depression, 5-9 = Mild depression, 10-14 = Moderate depression, 15-19 = Moderately severe depression, 20-27 = Severe depression   Psychosocial Evaluation and Intervention:   Psychosocial Re-Evaluation:   Psychosocial Discharge (Final Psychosocial Re-Evaluation):   Vocational Rehabilitation: Provide vocational rehab assistance to qualifying candidates.   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 Participant will state understanding/return demonstration of topics presented.  Learning Barriers/Preferences:     Learning Barriers/Preferences - 01/16/17 1139      Learning Barriers/Preferences   Learning Barriers None   Learning Preferences Audio;Computer/Internet;Group Instruction;Individual Instruction;Pictoral;Skilled Demonstration;Verbal Instruction;Video;Written Material      Education Topics: Count Your Pulse:  -Group instruction provided by verbal instruction, demonstration, patient participation and written materials to support subject.  Instructors address importance of being able to find your pulse and how to count your  pulse when at home  without a heart monitor.  Patients get hands on experience counting their pulse with staff help and individually.   Heart Attack, Angina, and Risk Factor Modification:  -Group instruction provided by verbal instruction, video, and written materials to support subject.  Instructors address signs and symptoms of angina and heart attacks.    Also discuss risk factors for heart disease and how to make changes to improve heart health risk factors.   Functional Fitness:  -Group instruction provided by verbal instruction, demonstration, patient participation, and written materials to support subject.  Instructors address safety measures for doing things around the house.  Discuss how to get up and down off the floor, how to pick things up properly, how to safely get out of a chair without assistance, and balance training.   Meditation and Mindfulness:  -Group instruction provided by verbal instruction, patient participation, and written materials to support subject.  Instructor addresses importance of mindfulness and meditation practice to help reduce stress and improve awareness.  Instructor also leads participants through a meditation exercise.    Stretching for Flexibility and Mobility:  -Group instruction provided by verbal instruction, patient participation, and written materials to support subject.  Instructors lead participants through series of stretches that are designed to increase flexibility thus improving mobility.  These stretches are additional exercise for major muscle groups that are typically performed during regular warm up and cool down.   Hands Only CPR:  -Group verbal, video, and participation provides a basic overview of AHA guidelines for community CPR. Role-play of emergencies allow participants the opportunity to practice calling for help and chest compression technique with discussion of AED use.   Hypertension: -Group verbal and written instruction that  provides a basic overview of hypertension including the most recent diagnostic guidelines, risk factor reduction with self-care instructions and medication management.    Nutrition I class: Heart Healthy Eating:  -Group instruction provided by PowerPoint slides, verbal discussion, and written materials to support subject matter. The instructor gives an explanation and review of the Therapeutic Lifestyle Changes diet recommendations, which includes a discussion on lipid goals, dietary fat, sodium, fiber, plant stanol/sterol esters, sugar, and the components of a well-balanced, healthy diet.   Nutrition II class: Lifestyle Skills:  -Group instruction provided by PowerPoint slides, verbal discussion, and written materials to support subject matter. The instructor gives an explanation and review of label reading, grocery shopping for heart health, heart healthy recipe modifications, and ways to make healthier choices when eating out.   Diabetes Question & Answer:  -Group instruction provided by PowerPoint slides, verbal discussion, and written materials to support subject matter. The instructor gives an explanation and review of diabetes co-morbidities, pre- and post-prandial blood glucose goals, pre-exercise blood glucose goals, signs, symptoms, and treatment of hypoglycemia and hyperglycemia, and foot care basics.   Diabetes Blitz:  -Group instruction provided by PowerPoint slides, verbal discussion, and written materials to support subject matter. The instructor gives an explanation and review of the physiology behind type 1 and type 2 diabetes, diabetes medications and rational behind using different medications, pre- and post-prandial blood glucose recommendations and Hemoglobin A1c goals, diabetes diet, and exercise including blood glucose guidelines for exercising safely.    Portion Distortion:  -Group instruction provided by PowerPoint slides, verbal discussion, written materials, and food  models to support subject matter. The instructor gives an explanation of serving size versus portion size, changes in portions sizes over the last 20 years, and what consists of a serving from each food group.  Stress Management:  -Group instruction provided by verbal instruction, video, and written materials to support subject matter.  Instructors review role of stress in heart disease and how to cope with stress positively.     Exercising on Your Own:  -Group instruction provided by verbal instruction, power point, and written materials to support subject.  Instructors discuss benefits of exercise, components of exercise, frequency and intensity of exercise, and end points for exercise.  Also discuss use of nitroglycerin and activating EMS.  Review options of places to exercise outside of rehab.  Review guidelines for sex with heart disease.   Cardiac Drugs I:  -Group instruction provided by verbal instruction and written materials to support subject.  Instructor reviews cardiac drug classes: antiplatelets, anticoagulants, beta blockers, and statins.  Instructor discusses reasons, side effects, and lifestyle considerations for each drug class.   Cardiac Drugs II:  -Group instruction provided by verbal instruction and written materials to support subject.  Instructor reviews cardiac drug classes: angiotensin converting enzyme inhibitors (ACE-I), angiotensin II receptor blockers (ARBs), nitrates, and calcium channel blockers.  Instructor discusses reasons, side effects, and lifestyle considerations for each drug class.   Anatomy and Physiology of the Circulatory System:  Group verbal and written instruction and models provide basic cardiac anatomy and physiology, with the coronary electrical and arterial systems. Review of: AMI, Angina, Valve disease, Heart Failure, Peripheral Artery Disease, Cardiac Arrhythmia, Pacemakers, and the ICD.   Other Education:  -Group or individual verbal,  written, or video instructions that support the educational goals of the cardiac rehab program.   Knowledge Questionnaire Score:   Core Components/Risk Factors/Patient Goals at Admission:     Personal Goals and Risk Factors at Admission - 01/16/17 1232      Core Components/Risk Factors/Patient Goals on Admission    Weight Management Yes;Obesity   Intervention Weight Management/Obesity: Establish reasonable short term and long term weight goals.;Obesity: Provide education and appropriate resources to help participant work on and attain dietary goals.;Weight Management: Provide education and appropriate resources to help participant work on and attain dietary goals.;Weight Management: Develop a combined nutrition and exercise program designed to reach desired caloric intake, while maintaining appropriate intake of nutrient and fiber, sodium and fats, and appropriate energy expenditure required for the weight goal.   Admit Weight 228 lb 2.8 oz (103.5 kg)   Goal Weight: Short Term 222 lb 3.2 oz (100.8 kg)   Goal Weight: Long Term 203 lb 3.2 oz (92.2 kg)   Expected Outcomes Short Term: Continue to assess and modify interventions until short term weight is achieved;Long Term: Adherence to nutrition and physical activity/exercise program aimed toward attainment of established weight goal;Weight Loss: Understanding of general recommendations for a balanced deficit meal plan, which promotes 1-2 lb weight loss per week and includes a negative energy balance of [phone] kcal/d   Diabetes Yes   Intervention Provide education about signs/symptoms and action to take for hypo/hyperglycemia.;Provide education about proper nutrition, including hydration, and aerobic/resistive exercise prescription along with prescribed medications to achieve blood glucose in normal ranges: Fasting glucose 65-99 mg/dL   Expected Outcomes Short Term: Participant verbalizes understanding of the signs/symptoms and immediate care of  hyper/hypoglycemia, proper foot care and importance of medication, aerobic/resistive exercise and nutrition plan for blood glucose control.;Long Term: Attainment of HbA1C < 7%.   Hypertension Yes   Intervention Provide education on lifestyle modifcations including regular physical activity/exercise, weight management, moderate sodium restriction and increased consumption of fresh fruit, vegetables, and low fat dairy, alcohol moderation,  and smoking cessation.;Monitor prescription use compliance.   Expected Outcomes Short Term: Continued assessment and intervention until BP is < 140/75mm HG in hypertensive participants. < 130/44mm HG in hypertensive participants with diabetes, heart failure or chronic kidney disease.;Long Term: Maintenance of blood pressure at goal levels.   Lipids Yes   Intervention Provide education and support for participant on nutrition & aerobic/resistive exercise along with prescribed medications to achieve LDL 70mg , HDL >40mg .   Expected Outcomes Short Term: Participant states understanding of desired cholesterol values and is compliant with medications prescribed. Participant is following exercise prescription and nutrition guidelines.;Long Term: Cholesterol controlled with medications as prescribed, with individualized exercise RX and with personalized nutrition plan. Value goals: LDL < 70mg , HDL > 40 mg.   Personal Goal Other Yes   Personal Goal Get back to normal self.   Intervention Provide exercise routine to help improve energy, strength, and stamina, so that patient can return to previous activities.   Expected Outcomes Patient will resume normal activites and "get back to normal self" as eveidenced by patient personal statement.      Core Components/Risk Factors/Patient Goals Review:    Core Components/Risk Factors/Patient Goals at Discharge (Final Review):    ITP Comments:     ITP Comments    Row Name 01/16/17 0950           ITP Comments Dr. Armanda Magic, Medical Director           Comments:  Patient attended orientation from 0800 to 0915 to review rules and guidelines for program. Completed 6 minute walk test, Intitial ITP, and exercise prescription.  VSS. Telemetry-SR.  Asymptomatic. Brief Psychosocial Assessment - Pt with no identifiable needs. Pt is looking forward to participating in cardiac rehab. Alanson Aly, BSN Cardiac and Emergency planning/management officer

## 2017-01-17 DIAGNOSIS — K080 Exfoliation of teeth due to systemic causes: Secondary | ICD-10-CM | POA: Diagnosis not present

## 2017-01-17 NOTE — Progress Notes (Signed)
Cardiac Individual Treatment Plan  Patient Details  Name: Connor Reed MRN: [id number] Date of Birth: 1961-04-08 Referring Provider:     CARDIAC REHAB PHASE II ORIENTATION from 01/16/2017 in [facility] CARDIAC REHAB  Referring Provider  Donato Schultz, MD.      Initial Encounter Date:    CARDIAC REHAB PHASE II ORIENTATION from 01/16/2017 in [facility] CARDIAC REHAB  Date  01/16/17  Referring Provider  Donato Schultz, MD.      Visit Diagnosis: NSTEMI (non-ST elevated myocardial infarction) [facility])  Stented coronary artery  Patient's Home Medications on Admission:  Current Outpatient Prescriptions:  .  aspirin 81 MG chewable tablet, Chew 1 tablet (81 mg total) by mouth daily., Disp: 30 tablet, Rfl: 2 .  atorvastatin (LIPITOR) 40 MG tablet, Take 1 tablet (40 mg total) by mouth daily at 6 PM., Disp: 30 tablet, Rfl: 6 .  clopidogrel (PLAVIX) 75 MG tablet, Take 1 tablet (75 mg total) by mouth daily with breakfast., Disp: 30 tablet, Rfl: 11 .  Fluticasone-Salmeterol (ADVAIR DISKUS) 100-50 MCG/DOSE AEPB, Inhale 1 puff into the lungs 2 (two) times daily., Disp: 60 each, Rfl: 0 .  glucose blood (ONETOUCH VERIO) test strip, Use as instructed, Disp: 100 each, Rfl: 0 .  Lancet Devices (ONE TOUCH DELICA LANCING DEV) MISC, 1 Units by Does not apply route 2 (two) times daily., Disp: 100 each, Rfl: 0 .  lisinopril (PRINIVIL,ZESTRIL) 5 MG tablet, Take 1 tablet (5 mg total) by mouth daily., Disp: 30 tablet, Rfl: 11 .  metFORMIN (GLUCOPHAGE) 500 MG tablet, Week 2: 500 mg tablet twice a day Week 3: 500 mg in morning & 1000 mg at night Week 4: 1000 mg tablet twice a day (Patient taking differently: 500 mg 2 (two) times daily with a meal. Week 2: 500 mg tablet twice a day Week 3: 500 mg in morning & 1000 mg at night Week 4: 1000 mg tablet twice a day), Disp: 60 tablet, Rfl: 1 .  nitroGLYCERIN (NITROSTAT) 0.4 MG SL tablet, Place 1 tablet (0.4 mg total) under the tongue every 5  (five) minutes as needed for chest pain., Disp: 25 tablet, Rfl: 3 .  PROAIR HFA 108 (90 Base) MCG/ACT inhaler, Inhale 2 puffs into the lungs 4 (four) times daily as needed for wheezing. , Disp: , Rfl: 0  Past Medical History: Past Medical History:  Diagnosis Date  . Asthma   . Chronic bronchitis (HCC)   . Hypertension     Tobacco Use: History  Smoking Status  . Former Smoker  . Packs/day: 1.00  . Years: 20.00  . Quit date: 06/19/1990  Smokeless Tobacco  . Never Used    Labs: Recent Review Flowsheet Data    Labs for ITP Cardiac and Pulmonary Rehab Latest Ref Rng & Units 12/17/2016 12/18/2016   Cholestrol 0 - 200 mg/dL - 782   LDLCALC 0 - 99 mg/dL - 82   HDL >95 mg/dL - 62(Z)   Trlycerides <308 mg/dL - 657(Q)   Hemoglobin I6N 4.8 - 5.6 % 10.3(H) -      Capillary Blood Glucose: Lab Results  Component Value Date   GLUCAP 208 (H) 12/20/2016   GLUCAP 173 (H) 12/20/2016   GLUCAP 200 (H) 12/19/2016   GLUCAP 235 (H) 12/19/2016   GLUCAP 211 (H) 12/19/2016     Exercise Target Goals: Date: 01/16/17  Exercise Program Goal: Individual exercise prescription set with THRR, safety & activity barriers. Participant demonstrates ability to understand and report RPE  using BORG scale, to self-measure pulse accurately, and to acknowledge the importance of the exercise prescription.  Exercise Prescription Goal: Starting with aerobic activity 30 plus minutes a day, 3 days per week for initial exercise prescription. Provide home exercise prescription and guidelines that participant acknowledges understanding prior to discharge.  Activity Barriers & Risk Stratification:     Activity Barriers & Cardiac Risk Stratification - 01/16/17 1135      Activity Barriers & Cardiac Risk Stratification   Activity Barriers Arthritis;Other (comment)   Comments Left hip pain   Cardiac Risk Stratification High      6 Minute Walk:     6 Minute Walk    Row Name 01/16/17 1134         6 Minute  Walk   Phase Initial     Distance 1636 feet     Walk Time 6 minutes     # of Rest Breaks 0     MPH 3.1     METS 4.04     RPE 11     VO2 Peak 14.13     Symptoms No     Resting HR 73 bpm     Resting BP 100/60     Max Ex. HR 120 bpm     Max Ex. BP 112/60     2 Minute Post BP 110/62        Oxygen Initial Assessment:   Oxygen Re-Evaluation:   Oxygen Discharge (Final Oxygen Re-Evaluation):   Initial Exercise Prescription:     Initial Exercise Prescription - 01/16/17 1100      Date of Initial Exercise RX and Referring Provider   Date 01/16/17   Referring Provider Donato SchultzSkains, Mark, MD.     Treadmill   MPH 3.4   Grade 0   Minutes 10   METs 3.6     Bike   Level 1.7   Minutes 10   METs 4.13     NuStep   Level 4   SPM 95   Minutes 10   METs 3.3     Prescription Details   Frequency (times per week) 3   Duration Progress to 30 minutes of continuous aerobic without signs/symptoms of physical distress     Intensity   THRR 40-80% of Max Heartrate 65-131   Ratings of Perceived Exertion 11-13   Perceived Dyspnea 0-4     Progression   Progression Continue to progress workloads to maintain intensity without signs/symptoms of physical distress.     Resistance Training   Training Prescription Yes   Weight 4lbs   Reps 10-15      Perform Capillary Blood Glucose checks as needed.  Exercise Prescription Changes:   Exercise Comments:   Exercise Goals and Review:     Exercise Goals    Row Name 01/16/17 1135             Exercise Goals   Increase Physical Activity Yes       Intervention Provide advice, education, support and counseling about physical activity/exercise needs.;Develop an individualized exercise prescription for aerobic and resistive training based on initial evaluation findings, risk stratification, comorbidities and participant's personal goals.       Expected Outcomes Achievement of increased cardiorespiratory fitness and enhanced  flexibility, muscular endurance and strength shown through measurements of functional capacity and personal statement of participant.       Increase Strength and Stamina Yes       Intervention Provide advice, education, support and counseling about physical activity/exercise needs.;Develop  an individualized exercise prescription for aerobic and resistive training based on initial evaluation findings, risk stratification, comorbidities and participant's personal goals.       Expected Outcomes Achievement of increased cardiorespiratory fitness and enhanced flexibility, muscular endurance and strength shown through measurements of functional capacity and personal statement of participant.          Exercise Goals Re-Evaluation :    Discharge Exercise Prescription (Final Exercise Prescription Changes):   Nutrition:  Target Goals: Understanding of nutrition guidelines, daily intake of sodium 1500mg , cholesterol 200mg , calories 30% from fat and 7% or less from saturated fats, daily to have 5 or more servings of fruits and vegetables.  Biometrics:     Pre Biometrics - 01/16/17 1137      Pre Biometrics   Height 5' 11.5" (1.816 m)   Weight 228 lb 2.8 oz (103.5 kg)   Waist Circumference 44.75 inches   Hip Circumference 41 inches   Waist to Hip Ratio 1.09 %   BMI (Calculated) 31.4   Triceps Skinfold 23 mm   % Body Fat 32.2 %   Grip Strength 49 kg   Flexibility 8 in   Single Leg Stand 30 seconds       Nutrition Therapy Plan and Nutrition Goals:     Nutrition Therapy & Goals - 01/16/17 1558      Nutrition Therapy   Diet Carb Modified, Therapeutic Lifestyle Changes     Personal Nutrition Goals   Nutrition Goal Pt to identify and limit food sources of saturated fat, trans fat, and sodium   Personal Goal #2 Improved blood glucose control as evidenced by pt's A1c trending toward 7.0 or less.   Personal Goal #3 Pt to identify food quantities necessary to achieve weight loss of 6-24 lb  (2.7-10.9 kg) at graduation from cardiac rehab. Goal wt of 200 lb desired.      Intervention Plan   Intervention Prescribe, educate and counsel regarding individualized specific dietary modifications aiming towards targeted core components such as weight, hypertension, lipid management, diabetes, heart failure and other comorbidities.   Expected Outcomes Short Term Goal: Understand basic principles of dietary content, such as calories, fat, sodium, cholesterol and nutrients.;Long Term Goal: Adherence to prescribed nutrition plan.      Nutrition Discharge: Nutrition Scores:     Nutrition Assessments - 01/16/17 1558      MEDFICTS Scores   Pre Score 52      Nutrition Goals Re-Evaluation:   Nutrition Goals Re-Evaluation:   Nutrition Goals Discharge (Final Nutrition Goals Re-Evaluation):   Psychosocial: Target Goals: Acknowledge presence or absence of significant depression and/or stress, maximize coping skills, provide positive support system. Participant is able to verbalize types and ability to use techniques and skills needed for reducing stress and depression.  Initial Review & Psychosocial Screening:     Initial Psych Review & Screening - 01/16/17 1430      Initial Review   Current issues with None Identified     Family Dynamics   Good Support System? Yes     Barriers   Psychosocial barriers to participate in program The patient should benefit from training in stress management and relaxation.      Quality of Life Scores:     Quality of Life - 01/16/17 1145      Quality of Life Scores   Health/Function Pre 27.2 %   Socioeconomic Pre 28.79 %   Psych/Spiritual Pre 27.43 %   Family Pre 22.8 %   GLOBAL Pre 26.93 %  PHQ-9: Recent Review Flowsheet Data    Depression screen [facility] 2/9 12/27/2016   Decreased Interest 0   Down, Depressed, Hopeless 0   PHQ - 2 Score 0     Interpretation of Total Score  Total Score Depression Severity:  1-4 = Minimal  depression, 5-9 = Mild depression, 10-14 = Moderate depression, 15-19 = Moderately severe depression, 20-27 = Severe depression   Psychosocial Evaluation and Intervention:   Psychosocial Re-Evaluation:   Psychosocial Discharge (Final Psychosocial Re-Evaluation):   Vocational Rehabilitation: Provide vocational rehab assistance to qualifying candidates.   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 Participant will state understanding/return demonstration of topics presented.  Learning Barriers/Preferences:     Learning Barriers/Preferences - 01/16/17 1139      Learning Barriers/Preferences   Learning Barriers None   Learning Preferences Audio;Computer/Internet;Group Instruction;Individual Instruction;Pictoral;Skilled Demonstration;Verbal Instruction;Video;Written Material      Education Topics: Count Your Pulse:  -Group instruction provided by verbal instruction, demonstration, patient participation and written materials to support subject.  Instructors address importance of being able to find your pulse and how to count your pulse when at home without a heart monitor.  Patients get hands on experience counting their pulse with staff help and individually.   Heart Attack, Angina, and Risk Factor Modification:  -Group instruction provided by verbal instruction, video, and written materials to support subject.  Instructors address signs and symptoms of angina and heart attacks.    Also discuss risk factors for heart disease and how to make changes to improve heart health risk factors.   Functional Fitness:  -Group instruction provided by verbal  instruction, demonstration, patient participation, and written materials to support subject.  Instructors address safety measures for doing things around the house.  Discuss how to get up and down off the floor, how to pick things up properly, how to safely get out of a chair without assistance, and balance training.   Meditation and Mindfulness:  -Group instruction provided by verbal instruction, patient participation, and written materials to support subject.  Instructor addresses importance of mindfulness and meditation practice to help reduce stress and improve awareness.  Instructor also leads participants through a meditation exercise.    Stretching for Flexibility and Mobility:  -Group instruction provided by verbal instruction, patient participation, and written materials to support subject.  Instructors lead participants through series of stretches that are designed to increase flexibility thus improving mobility.  These stretches are additional exercise for major muscle groups that are typically performed during regular warm up and cool down.   Hands Only CPR:  -Group verbal, video, and participation provides a basic overview of AHA guidelines for community CPR. Role-play of emergencies allow participants the opportunity to practice calling for help and chest compression technique with discussion of AED use.   Hypertension: -Group verbal and written instruction that provides a basic overview of hypertension including the most recent diagnostic guidelines, risk factor reduction with self-care instructions and medication management.    Nutrition I class: Heart Healthy Eating:  -Group instruction provided by PowerPoint slides, verbal discussion, and written materials to support subject matter. The instructor gives an explanation and review of the Therapeutic Lifestyle  Changes diet recommendations, which includes a discussion on lipid goals, dietary fat, sodium, fiber, plant stanol/sterol  esters, sugar, and the components of a well-balanced, healthy diet.   Nutrition II class: Lifestyle Skills:  -Group instruction provided by PowerPoint slides, verbal discussion, and written materials to support subject matter. The instructor gives an explanation and review of label reading, grocery shopping for heart health, heart healthy recipe modifications, and ways to make healthier choices when eating out.   Diabetes Question & Answer:  -Group instruction provided by PowerPoint slides, verbal discussion, and written materials to support subject matter. The instructor gives an explanation and review of diabetes co-morbidities, pre- and post-prandial blood glucose goals, pre-exercise blood glucose goals, signs, symptoms, and treatment of hypoglycemia and hyperglycemia, and foot care basics.   Diabetes Blitz:  -Group instruction provided by PowerPoint slides, verbal discussion, and written materials to support subject matter. The instructor gives an explanation and review of the physiology behind type 1 and type 2 diabetes, diabetes medications and rational behind using different medications, pre- and post-prandial blood glucose recommendations and Hemoglobin A1c goals, diabetes diet, and exercise including blood glucose guidelines for exercising safely.    Portion Distortion:  -Group instruction provided by PowerPoint slides, verbal discussion, written materials, and food models to support subject matter. The instructor gives an explanation of serving size versus portion size, changes in portions sizes over the last 20 years, and what consists of a serving from each food group.   Stress Management:  -Group instruction provided by verbal instruction, video, and written materials to support subject matter.  Instructors review role of stress in heart disease and how to cope with stress positively.     Exercising on Your Own:  -Group instruction provided by verbal instruction, power point, and  written materials to support subject.  Instructors discuss benefits of exercise, components of exercise, frequency and intensity of exercise, and end points for exercise.  Also discuss use of nitroglycerin and activating EMS.  Review options of places to exercise outside of rehab.  Review guidelines for sex with heart disease.   Cardiac Drugs I:  -Group instruction provided by verbal instruction and written materials to support subject.  Instructor reviews cardiac drug classes: antiplatelets, anticoagulants, beta blockers, and statins.  Instructor discusses reasons, side effects, and lifestyle considerations for each drug class.   Cardiac Drugs II:  -Group instruction provided by verbal instruction and written materials to support subject.  Instructor reviews cardiac drug classes: angiotensin converting enzyme inhibitors (ACE-I), angiotensin II receptor blockers (ARBs), nitrates, and calcium channel blockers.  Instructor discusses reasons, side effects, and lifestyle considerations for each drug class.   Anatomy and Physiology of the Circulatory System:  Group verbal and written instruction and models provide basic cardiac anatomy and physiology, with the coronary electrical and arterial systems. Review of: AMI, Angina, Valve disease, Heart Failure, Peripheral Artery Disease, Cardiac Arrhythmia, Pacemakers, and the ICD.   Other Education:  -Group or individual verbal, written, or video instructions that support the educational goals of the cardiac rehab program.   Knowledge Questionnaire Score:   Core Components/Risk Factors/Patient Goals at Admission:     Personal Goals and Risk Factors at Admission - 01/16/17 1232      Core Components/Risk Factors/Patient Goals on Admission    Weight Management Yes;Obesity   Intervention Weight Management/Obesity: Establish reasonable short term and long term weight goals.;Obesity: Provide education and appropriate resources to help participant work on  and attain dietary goals.;Weight Management: Provide education and appropriate resources  to help participant work on and attain dietary goals.;Weight Management: Develop a combined nutrition and exercise program designed to reach desired caloric intake, while maintaining appropriate intake of nutrient and fiber, sodium and fats, and appropriate energy expenditure required for the weight goal.   Admit Weight 228 lb 2.8 oz (103.5 kg)   Goal Weight: Short Term 222 lb 3.2 oz (100.8 kg)   Goal Weight: Long Term 203 lb 3.2 oz (92.2 kg)   Expected Outcomes Short Term: Continue to assess and modify interventions until short term weight is achieved;Long Term: Adherence to nutrition and physical activity/exercise program aimed toward attainment of established weight goal;Weight Loss: Understanding of general recommendations for a balanced deficit meal plan, which promotes 1-2 lb weight loss per week and includes a negative energy balance of [phone] kcal/d   Diabetes Yes   Intervention Provide education about signs/symptoms and action to take for hypo/hyperglycemia.;Provide education about proper nutrition, including hydration, and aerobic/resistive exercise prescription along with prescribed medications to achieve blood glucose in normal ranges: Fasting glucose 65-99 mg/dL   Expected Outcomes Short Term: Participant verbalizes understanding of the signs/symptoms and immediate care of hyper/hypoglycemia, proper foot care and importance of medication, aerobic/resistive exercise and nutrition plan for blood glucose control.;Long Term: Attainment of HbA1C < 7%.   Hypertension Yes   Intervention Provide education on lifestyle modifcations including regular physical activity/exercise, weight management, moderate sodium restriction and increased consumption of fresh fruit, vegetables, and low fat dairy, alcohol moderation, and smoking cessation.;Monitor prescription use compliance.   Expected Outcomes Short Term:  Continued assessment and intervention until BP is < 140/48mm HG in hypertensive participants. < 130/40mm HG in hypertensive participants with diabetes, heart failure or chronic kidney disease.;Long Term: Maintenance of blood pressure at goal levels.   Lipids Yes   Intervention Provide education and support for participant on nutrition & aerobic/resistive exercise along with prescribed medications to achieve LDL 70mg , HDL >40mg .   Expected Outcomes Short Term: Participant states understanding of desired cholesterol values and is compliant with medications prescribed. Participant is following exercise prescription and nutrition guidelines.;Long Term: Cholesterol controlled with medications as prescribed, with individualized exercise RX and with personalized nutrition plan. Value goals: LDL < 70mg , HDL > 40 mg.   Personal Goal Other Yes   Personal Goal Get back to normal self.   Intervention Provide exercise routine to help improve energy, strength, and stamina, so that patient can return to previous activities.   Expected Outcomes Patient will resume normal activites and "get back to normal self" as eveidenced by patient personal statement.      Core Components/Risk Factors/Patient Goals Review:    Core Components/Risk Factors/Patient Goals at Discharge (Final Review):    ITP Comments:     ITP Comments    Row Name 01/16/17 0950 01/17/17 1642         ITP Comments Dr. Armanda Magic, Medical Director  Dr. Armanda Magic, Medical Director          Comments: pt scheduled to begin group exercise 01/22/2017.

## 2017-01-19 ENCOUNTER — Other Ambulatory Visit: Payer: Self-pay | Admitting: Internal Medicine

## 2017-01-22 ENCOUNTER — Encounter (HOSPITAL_COMMUNITY): Payer: Federal, State, Local not specified - PPO

## 2017-01-22 ENCOUNTER — Encounter (HOSPITAL_COMMUNITY): Payer: Self-pay

## 2017-01-24 ENCOUNTER — Encounter (HOSPITAL_COMMUNITY): Payer: Self-pay

## 2017-01-24 ENCOUNTER — Encounter (HOSPITAL_COMMUNITY)
Admission: RE | Admit: 2017-01-24 | Discharge: 2017-01-24 | Disposition: A | Discharge status: Home / Self Care | Payer: Federal, State, Local not specified - PPO | Source: Ambulatory Visit | Attending: Cardiology | Admitting: Cardiology

## 2017-01-24 DIAGNOSIS — I214 Non-ST elevation (NSTEMI) myocardial infarction: Secondary | ICD-10-CM

## 2017-01-24 DIAGNOSIS — Z955 Presence of coronary angioplasty implant and graft: Secondary | ICD-10-CM | POA: Diagnosis not present

## 2017-01-24 LAB — GLUCOSE, CAPILLARY
GLUCOSE-CAPILLARY: 128 mg/dL — AB (ref 65–99)
GLUCOSE-CAPILLARY: 168 mg/dL — AB (ref 65–99)

## 2017-01-24 NOTE — Progress Notes (Signed)
Daily Session Note  Patient Details  Name: Connor Reed MRN: [id number] Date of Birth: 19-Jan-1961 Referring Provider:     CARDIAC REHAB PHASE II ORIENTATION from 01/16/2017 in [place]  Referring Provider  Candee Furbish, MD.      Encounter Date: 01/24/2017  Check In:     Session Check In - 01/24/17 0728      Check-In   Location [facility]   Staff Present Maurice Small, RN, BSN;Marcellius Montagna, RN, Deland Pretty, MS, ACSM CEP, Exercise Physiologist;Lisa Ysidro Evert, RN   Supervising physician immediately available to respond to emergencies [facility] immediately available   Physician(s) Dr. Clementeen Graham   Medication changes reported     No   Fall or balance concerns reported    No   Tobacco Cessation No Change   Warm-up and Cool-down Performed as group-led instruction   Resistance Training Performed No   VAD Patient? No     Pain Assessment   Currently in Pain? No/denies      Capillary Blood Glucose: Results for orders placed or performed during the hospital encounter of 01/24/17 (from the past 24 hour(s))  Glucose, capillary     Status: Abnormal   Collection Time: 01/24/17  7:46 AM  Result Value Ref Range   Glucose-Capillary 168 (H) 65 - 99 mg/dL      History  Smoking Status  . Former Smoker  . Packs/day: 1.00  . Years: 20.00  . Quit date: 06/19/1990  Smokeless Tobacco  . Never Used    Goals Met:  Exercise tolerated well  Goals Unmet:  Not Applicable  Comments: Pt started cardiac rehab today.  Pt tolerated light exercise without difficulty. VSS, telemetry-sinus rhythm,  asymptomatic.  Medication list reconciled. Pt denies barriers to medicaiton compliance.  PSYCHOSOCIAL ASSESSMENT:  PHQ-0.   Pt exhibits positive coping skills, hopeful outlook with supportive family. No psychosocial needs identified at this time, no psychosocial interventions necessary.    Pt enjoys spending time with friends and going to the  pool.  Pt goals for cardiac rehab are to get back in shape and continue with weight loss.    Pt oriented to exercise equipment and routine.    Understanding verbalized.   Dr. Fransico Him is Medical Director for Cardiac Rehab at [facility].

## 2017-01-26 ENCOUNTER — Other Ambulatory Visit: Payer: Self-pay | Admitting: Internal Medicine

## 2017-01-26 ENCOUNTER — Encounter (HOSPITAL_COMMUNITY)
Admission: RE | Admit: 2017-01-26 | Discharge: 2017-01-26 | Disposition: A | Discharge status: Home / Self Care | Payer: Federal, State, Local not specified - PPO | Source: Ambulatory Visit | Attending: Cardiology | Admitting: Cardiology

## 2017-01-26 DIAGNOSIS — I214 Non-ST elevation (NSTEMI) myocardial infarction: Secondary | ICD-10-CM

## 2017-01-26 DIAGNOSIS — Z955 Presence of coronary angioplasty implant and graft: Secondary | ICD-10-CM

## 2017-01-26 LAB — GLUCOSE, CAPILLARY: GLUCOSE-CAPILLARY: 112 mg/dL — AB (ref 65–99)

## 2017-01-26 MED ORDER — FLUTICASONE-SALMETEROL 100-50 MCG/DOSE IN AEPB: 1.0000 | INHALATION_SPRAY | Freq: Two times a day (BID) | RESPIRATORY_TRACT | 3 refills | Status: DC

## 2017-01-29 ENCOUNTER — Encounter (HOSPITAL_COMMUNITY)
Admission: RE | Admit: 2017-01-29 | Discharge: 2017-01-29 | Disposition: A | Discharge status: Home / Self Care | Payer: Federal, State, Local not specified - PPO | Source: Ambulatory Visit | Attending: Cardiology | Admitting: Cardiology

## 2017-01-29 DIAGNOSIS — I214 Non-ST elevation (NSTEMI) myocardial infarction: Secondary | ICD-10-CM | POA: Diagnosis not present

## 2017-01-29 DIAGNOSIS — Z955 Presence of coronary angioplasty implant and graft: Secondary | ICD-10-CM | POA: Diagnosis not present

## 2017-01-30 ENCOUNTER — Telehealth (HOSPITAL_COMMUNITY): Payer: Self-pay | Admitting: Cardiac Rehabilitation

## 2017-01-30 NOTE — Telephone Encounter (Signed)
-----   Message from Jake Bathe, MD sent at 01/30/2017  7:00 AM EDT ----- Regarding: RE: cardiac rehab  Ok with me. Start slow however with his weight lifting.  Donato Schultz, MD  ----- Message ----- From: Robyne Peers, RN Sent: 01/29/2017  10:57 AM To: Jake Bathe, MD Subject: cardiac rehab                                  Dear Dr. Anne Fu,  Pt s/p NSTEMI, DES LAD, AV circ.  He would like to know what his weight lifting restriction is.  (for general activity-he is only using light hand weights at cardiac rehab)  Thank you, Deveron Furlong, RN, BSN Cardiac Pulmonary Rehab

## 2017-01-31 ENCOUNTER — Encounter (HOSPITAL_COMMUNITY)
Admission: RE | Admit: 2017-01-31 | Discharge: 2017-01-31 | Disposition: A | Discharge status: Home / Self Care | Payer: Federal, State, Local not specified - PPO | Source: Ambulatory Visit | Attending: Cardiology | Admitting: Cardiology

## 2017-01-31 DIAGNOSIS — I214 Non-ST elevation (NSTEMI) myocardial infarction: Secondary | ICD-10-CM | POA: Diagnosis not present

## 2017-01-31 DIAGNOSIS — Z955 Presence of coronary angioplasty implant and graft: Secondary | ICD-10-CM

## 2017-02-02 ENCOUNTER — Telehealth (HOSPITAL_COMMUNITY): Payer: Self-pay | Admitting: Cardiac Rehabilitation

## 2017-02-02 ENCOUNTER — Encounter (HOSPITAL_COMMUNITY): Payer: Federal, State, Local not specified - PPO

## 2017-02-02 NOTE — Telephone Encounter (Signed)
-----   Message from Rozann LeschesMarybeth Nedrud, MD sent at 02/01/2017  6:45 AM EDT ----- Regarding: RE: cardiac rehab  Thanks for the note. The patient can do whatever he would like with his metformin - we usually want people to take max dose tolerated if he only tolerates 500 mg BID that is fine. I can address it with him at his next visit. I already refilled his Advair on 01/26/2017. I sent the Rx to the pharmacy on record with 3 refills about a week ago. If he doesn't have Advair now he needs to check with his pharmacy.  Thanks, Jeanella FlatteryMarybeth  ----- Message ----- From: Robyne Peersion, Joann H, RN Sent: 01/31/2017   4:46 PM To: Rozann LeschesMarybeth Nedrud, MD Subject: cardiac rehab                                  Dear Dr. Saunders RevelNedrud,  Pt started cardiac rehab this week. Upon medication reconcilation, pt reports taking metformin 500mg  BID.  Would that be ok for him to continue until his next office visit?  He is not interested in increasing his dose to 1000mg  as indicated in your recent office note.  CBG readings:  112-168 over past 2 weeks at cardiac rehab.   Also, pt needs refill of Advair inhaler.   Thank you, Deveron FurlongJoann Rion, RN, BSN Cardiac Pulmonary Rehab

## 2017-02-07 ENCOUNTER — Encounter (HOSPITAL_COMMUNITY)
Admission: RE | Admit: 2017-02-07 | Discharge: 2017-02-07 | Disposition: A | Discharge status: Home / Self Care | Payer: Federal, State, Local not specified - PPO | Source: Ambulatory Visit | Attending: Cardiology | Admitting: Cardiology

## 2017-02-07 DIAGNOSIS — I214 Non-ST elevation (NSTEMI) myocardial infarction: Secondary | ICD-10-CM

## 2017-02-07 DIAGNOSIS — Z955 Presence of coronary angioplasty implant and graft: Secondary | ICD-10-CM | POA: Diagnosis not present

## 2017-02-08 ENCOUNTER — Ambulatory Visit: Payer: Federal, State, Local not specified - PPO | Admitting: Registered"

## 2017-02-09 ENCOUNTER — Encounter (HOSPITAL_COMMUNITY)
Admission: RE | Admit: 2017-02-09 | Discharge: 2017-02-09 | Disposition: A | Discharge status: Home / Self Care | Payer: Federal, State, Local not specified - PPO | Source: Ambulatory Visit | Attending: Cardiology | Admitting: Cardiology

## 2017-02-09 DIAGNOSIS — Z955 Presence of coronary angioplasty implant and graft: Secondary | ICD-10-CM | POA: Diagnosis not present

## 2017-02-09 DIAGNOSIS — I214 Non-ST elevation (NSTEMI) myocardial infarction: Secondary | ICD-10-CM

## 2017-02-09 NOTE — Progress Notes (Signed)
Reviewed home exercise guidelines with patient including endpoints, temperature precautions, target heart rate and rate of perceived exertion. Pt has a home gym and is using his treadmill 45-60 minutes, 2-3 days/week as his mode of home exercise. Pt voices understanding of instructions given. Artist Paislinty M Aryella Besecker, MS, ACSM CEP

## 2017-02-12 ENCOUNTER — Encounter (HOSPITAL_COMMUNITY)
Admission: RE | Admit: 2017-02-12 | Discharge: 2017-02-12 | Disposition: A | Discharge status: Home / Self Care | Payer: Federal, State, Local not specified - PPO | Source: Ambulatory Visit | Attending: Cardiology | Admitting: Cardiology

## 2017-02-12 ENCOUNTER — Ambulatory Visit (INDEPENDENT_AMBULATORY_CARE_PROVIDER_SITE_OTHER): Payer: Federal, State, Local not specified - PPO | Admitting: Internal Medicine

## 2017-02-12 ENCOUNTER — Encounter: Payer: Self-pay | Admitting: Internal Medicine

## 2017-02-12 ENCOUNTER — Encounter: Payer: Self-pay | Admitting: Registered"

## 2017-02-12 ENCOUNTER — Encounter: Payer: Federal, State, Local not specified - PPO | Attending: Nurse Practitioner | Admitting: Registered"

## 2017-02-12 DIAGNOSIS — I2510 Atherosclerotic heart disease of native coronary artery without angina pectoris: Secondary | ICD-10-CM

## 2017-02-12 DIAGNOSIS — I214 Non-ST elevation (NSTEMI) myocardial infarction: Secondary | ICD-10-CM

## 2017-02-12 DIAGNOSIS — J42 Unspecified chronic bronchitis: Secondary | ICD-10-CM | POA: Diagnosis not present

## 2017-02-12 DIAGNOSIS — Z7902 Long term (current) use of antithrombotics/antiplatelets: Secondary | ICD-10-CM

## 2017-02-12 DIAGNOSIS — Z955 Presence of coronary angioplasty implant and graft: Secondary | ICD-10-CM

## 2017-02-12 DIAGNOSIS — Z87891 Personal history of nicotine dependence: Secondary | ICD-10-CM

## 2017-02-12 DIAGNOSIS — Z23 Encounter for immunization: Secondary | ICD-10-CM | POA: Diagnosis not present

## 2017-02-12 DIAGNOSIS — Z7984 Long term (current) use of oral hypoglycemic drugs: Secondary | ICD-10-CM | POA: Diagnosis not present

## 2017-02-12 DIAGNOSIS — I252 Old myocardial infarction: Secondary | ICD-10-CM

## 2017-02-12 DIAGNOSIS — E1100 Type 2 diabetes mellitus with hyperosmolarity without nonketotic hyperglycemic-hyperosmolar coma (NKHHC): Secondary | ICD-10-CM | POA: Diagnosis not present

## 2017-02-12 DIAGNOSIS — Z713 Dietary counseling and surveillance: Secondary | ICD-10-CM | POA: Diagnosis not present

## 2017-02-12 DIAGNOSIS — I10 Essential (primary) hypertension: Secondary | ICD-10-CM | POA: Diagnosis not present

## 2017-02-12 DIAGNOSIS — Z7982 Long term (current) use of aspirin: Secondary | ICD-10-CM | POA: Diagnosis not present

## 2017-02-12 DIAGNOSIS — E119 Type 2 diabetes mellitus without complications: Secondary | ICD-10-CM

## 2017-02-12 DIAGNOSIS — Z7951 Long term (current) use of inhaled steroids: Secondary | ICD-10-CM | POA: Diagnosis not present

## 2017-02-12 DIAGNOSIS — Z79899 Other long term (current) drug therapy: Secondary | ICD-10-CM

## 2017-02-12 LAB — GLUCOSE, CAPILLARY: GLUCOSE-CAPILLARY: 119 mg/dL — AB (ref 65–99)

## 2017-02-12 MED ORDER — GLUCOSE BLOOD VI STRP: ORAL_STRIP | 3 refills | Status: DC

## 2017-02-12 MED ORDER — ASPIRIN 81 MG PO CHEW: 81.0000 mg | CHEWABLE_TABLET | Freq: Every day | ORAL | 5 refills | Status: DC

## 2017-02-12 MED ORDER — FLUTICASONE-SALMETEROL 100-50 MCG/DOSE IN AEPB: 1.0000 | INHALATION_SPRAY | Freq: Two times a day (BID) | RESPIRATORY_TRACT | 3 refills | Status: DC

## 2017-02-12 MED ORDER — PROAIR HFA 108 (90 BASE) MCG/ACT IN AERS: 2.0000 | INHALATION_SPRAY | Freq: Four times a day (QID) | RESPIRATORY_TRACT | 3 refills | Status: DC | PRN

## 2017-02-12 MED ORDER — METFORMIN HCL 500 MG PO TABS: ORAL_TABLET | ORAL | 5 refills | Status: DC

## 2017-02-12 NOTE — Progress Notes (Signed)
CC: T2DM, HTN, and CAD follow up  HPI:  Connor Reed is a 56 y.o. with a PMH of CAD s/p NSTEMI with cardiac catheterization and stent placement in 12/2016, HTN, T2DM, and chronic bronchitis who presents today for evaluation of his chronic medical conditions.   The patient has felt well since his last visit. He has been actively participating in cardiac rehabilitation as an outpatient since his discharge from the hospital. He states that he has lost about 20 lbs as a result of this increased exercise and that his energy level has greatly improved. He can tell that this rehabilitation is making a great difference in his health. He has been able to take aspirin, clopidogrel, atorvastatin and lisinopril without significant side effects over the past month.   He has also made radical changes in his diet since his last visit. He has decreased the amount of sweets and carbohydrates he takes in on a daily basis. He tests his blood sugars twice a day and states that except for one reading near 300, most readings have been around 120. He states that he has not had any problems taking metformin 500 mg BID. He does not wish to escalate this therapy at this time, as his blood sugar readings have been good and he does not want to experience symptoms of hypoglycemia that he has read about.   He does endorse intermittent shortness of breath, which often occurs with exercise. This SOB improves with daily use of his red inhaler (albuterol). He states that he has been taking albuterol at least 4 times a day, everyday and that he has run out of this medication since he has taken it so much. He states that he has not been taking his purple inhaler (Advair) every day as prescribed. He thought he was only supposed to take the purple inhaler when he had trouble managing his breathing with albuterol and that is how he has been taking it. He states that his breathing is much improved whenever he takes Advair and that he  thinks this medication helps him more than albuterol and lasts longer than albuterol.   The patient has many questions about his medications and inquires about when he will be healthy enough for sexual intercourse. He also has questions about if he can take erectile dysfunction medication now that he had a heart attack in July. He has previously taken Cialis without difficulty, but states that he is worried about trying it in the future given the warnings about this medication in patients with previous heart conditions.  Past Medical History:  Diagnosis Date  . Asthma   . Chronic bronchitis (HCC)   . Hypertension    Review of Systems:   Patient endorses shortness of breath and cough with exercise, as per HPI Patient denies chest pain, abdominal pain, diaphoresis, nausea/vomiting, lower extremity swelling, and change in bowel/bladder habits.  Physical Exam:  Vitals:   02/12/17 1552  BP: 136/87  Pulse: 72  Temp: 98 F (36.7 C)  TempSrc: Oral  SpO2: 98%  Weight: 233 lb 9.6 oz (106 kg)  Height: 6' (1.829 m)   Physical Exam  Constitutional: He appears well-developed and well-nourished.  Cardiovascular: Normal rate, regular rhythm and intact distal pulses.  Exam reveals no friction rub.   No murmur heard. Pulmonary/Chest: Effort normal. No respiratory distress. He has no wheezes. He has no rales.  Abdominal: Soft. He exhibits no distension. There is no tenderness. There is no guarding.  Musculoskeletal: He exhibits  no edema (of bilateral lower extremities) or tenderness (of bilateral lower extremities).  Skin: Skin is warm and dry. Capillary refill takes less than 2 seconds. No rash noted. No erythema. No pallor.  Psychiatric: His behavior is normal. Thought content normal.   Assessment & Plan:   See Encounters Tab for problem based charting.  Patient seen with Dr. Heide Spark.

## 2017-02-12 NOTE — Assessment & Plan Note (Signed)
Patient improving greatly with cardiac rehabilitation. He denies any symptoms of chest pain with exertion and states that his SOB with exercise feels more like an asthma flare rather than any thing else. He denies symptoms of CHF, including LE swelling and orthopnea, and does not appear volume overloaded on exam. I agree that his symptoms of SOB are related to asthma/chronic bronchitis rather than cardiac in nature. The patient appears to have been taking his inhalers backwards, as he has been using the "purple" inhaler (Advair) as a rescue inhaler and the "red" inhaler (proair, albuterol) as a daily maintenance medication. The patient was instructed on how to properly take this medication moving forward. He was told to call back if he has questions regarding this medication.   The patient also had questions re: sexual intercourse after heart attack. The patient was instructed to ask his cardiologist about when his heart is healthy enough for sexual intercourse and/or when he can restart use of ED medication if desired.  Plan: -Continue cardiac rehabilitation  -Continue aspirin, clopidogrel, and nitroglycerin PRN -Continue use of daily Advair and PRN albuterol for SOB during cardiac rehab

## 2017-02-12 NOTE — Patient Instructions (Signed)
Plan:  Aim for 3-5 Carb Choices per meal (45-75 grams)   Aim for 0-2 Carbs per snack if hungry  Include protein with your meals and snacks Consider reading food labels for Total Carbohydrate and Sat Fat Grams of foods Continue your activity level daily as tolerated Consider checking BG at alternate times per day as directed by MD  Continue taking medication as directed by MD Consider having nuts daily Consider having fish 2-3 week. Talk your doctor about an omega-3 supplement

## 2017-02-12 NOTE — Assessment & Plan Note (Signed)
BP at goal of <140/90 today.  Continue lisinopril 5 mg daily.

## 2017-02-12 NOTE — Assessment & Plan Note (Addendum)
Patient's BG 119 today in clinic. Patient making appropriate diet and lifestyle modifications necessary for T2DM. The patient also is taking 500 mg metformin BID. He is reluctant to increase this medication at this time 2/2 concern for hypoglycemia. I reassured him that this is not a concern with metformin, but patient would like to keep his dose the same until an A1C recheck in 1 month. He is open to changing medications after this visit when he knows how his average BG responds to his diet, lifestyle, and medical interventions. This seems reasonable to me.   Plan: -Continue metformin 500 mg BID -Continue aggressive diet and lifestyle modifications, including weight loss and carb-controlled diet -Follow up in 1 month for A1C check and reassessment.

## 2017-02-12 NOTE — Progress Notes (Signed)
Diabetes Self-Management Education  Visit Type: First/Initial  Appt. Start Time: 0900 Appt. End Time: 1025  02/12/2017  [name], identified by name and date of birth, is a 56 y.o. male with a diagnosis of Diabetes: Type 2.   ASSESSMENT Pt states he started Atkins diet right after got out of hospital (heart attack) and has lost 20 lbs since then and wants to lose another 20 lbs believes this will address diabetes. Pt states he had been checking his BG for some time and was regularly seeing BG in the 250s, and thought it was high, but didn't know what it should be.     Diabetes Self-Management Education - 02/12/17 0856      Visit Information   Visit Type First/Initial     Initial Visit   Diabetes Type Type 2   Are you currently following a meal plan? Yes   What type of meal plan do you follow? Connor Reed   Are you taking your medications as prescribed? Yes   Date Diagnosed July 2018     Health Coping   How would you rate your overall health? Good     Psychosocial Assessment   Patient Belief/Attitude about Diabetes Motivated to manage diabetes   How often do you need to have someone help you when you read instructions, pamphlets, or other written materials from your doctor or pharmacy? 1 - Never   What is the last grade level you completed in school? 12     Complications   Last HgB A1C per patient/outside source 10.3 %  per chart 12/17/16   How often do you check your blood sugar? 1-2 times/day   Fasting Blood glucose range (mg/dL) 16-109  604-540 highest this 157 just ate breakfast   Postprandial Blood glucose range (mg/dL) --  981 - 191   Number of hypoglycemic episodes per month 0   Number of hyperglycemic episodes per week 1   Have you had a dilated eye exam in the past 12 months? Yes   Have you had a dental exam in the past 12 months? Yes   Are you checking your feet? Yes   How many days per week are you checking your feet? 3     Dietary Intake   Breakfast honey  nut cheerios, milk, coffee   Snack (morning) none OR    Lunch hamburger, broccoli   Snack (afternoon) none   Dinner [place], (little rice, beans) chips, margaritta~30 cho, beer ~10 g (BG >200)   Snack (evening) none OR a few cheetos   Beverage(s) water, beer occassionally, sprite zero, coffee,      Exercise   Exercise Type Light (walking / raking leaves)   How many days per week to you exercise? 5   How many minutes per day do you exercise? 60   Total minutes per week of exercise 300     Patient Education   Previous Diabetes Education No   Disease state  Definition of diabetes, type 1 and 2, and the diagnosis of diabetes   Nutrition management  Role of diet in the treatment of diabetes and the relationship between the three main macronutrients and blood glucose level;Carbohydrate counting   Physical activity and exercise  Role of exercise on diabetes management, blood pressure control and cardiac health.   Monitoring Identified appropriate SMBG and/or A1C goals.;Purpose and frequency of SMBG.     Individualized Goals (developed by patient)   Nutrition Follow meal plan discussed     Outcomes  Expected Outcomes Demonstrated interest in learning. Expect positive outcomes   Future DMSE 4-6 wks   Program Status Not Completed    Individualized Plan for Diabetes Self-Management Training:   Learning Objective:  Patient will have a greater understanding of diabetes self-management. Patient education plan is to attend individual and/or group sessions per assessed needs and concerns.   Patient Instructions  Plan:  Aim for 3-5 Carb Choices per meal (45-75 grams)   Aim for 0-2 Carbs per snack if hungry  Include protein with your meals and snacks Consider reading food labels for Total Carbohydrate and Sat Fat Grams of foods Continue your activity level daily as tolerated Consider checking BG at alternate times per day as directed by MD  Continue taking medication as directed by  MD Consider having nuts daily Consider having fish 2-3 week. Talk your doctor about an omega-3 supplement   Expected Outcomes:  Demonstrated interest in learning. Expect positive outcomes  Education material provided: Living Well with Diabetes, A1C conversion sheet, My Plate and Carbohydrate counting sheet  If problems or questions, patient to contact team via:  Phone and MyChart  Future DSME appointment: 4-6 wks

## 2017-02-12 NOTE — Patient Instructions (Addendum)
Thank you for seeing us today!  Please take your Advair 1 time a day. Use your albuterol inhaler as needed.  We will see you back in clinic in 1 month for an A1C check.

## 2017-02-14 ENCOUNTER — Encounter (HOSPITAL_COMMUNITY)
Admission: RE | Admit: 2017-02-14 | Discharge: 2017-02-14 | Disposition: A | Discharge status: Home / Self Care | Payer: Federal, State, Local not specified - PPO | Source: Ambulatory Visit | Attending: Cardiology | Admitting: Cardiology

## 2017-02-14 DIAGNOSIS — I214 Non-ST elevation (NSTEMI) myocardial infarction: Secondary | ICD-10-CM | POA: Diagnosis not present

## 2017-02-14 DIAGNOSIS — Z955 Presence of coronary angioplasty implant and graft: Secondary | ICD-10-CM | POA: Diagnosis not present

## 2017-02-14 NOTE — Progress Notes (Signed)
Internal Medicine Clinic Attending  I saw and evaluated the patient.  I personally confirmed the key portions of the history and exam documented by Dr. Nedrud and I reviewed pertinent patient test results.  The assessment, diagnosis, and plan were formulated together and I agree with the documentation in the resident's note.  

## 2017-02-14 NOTE — Addendum Note (Signed)
Addended by: Earl LagosNARENDRA, Jhayden Demuro on: 02/14/2017 11:41 AM   Modules accepted: Level of Service

## 2017-02-14 NOTE — Progress Notes (Signed)
Connor Reed 56 y.o. male DOB: 1960-07-27 MRN: [id number]      Nutrition Note  1. Stented coronary artery   2. NSTEMI (non-ST elevated myocardial infarction) ([place])    Note Spoke with pt. Nutrition plan and survey reviewed with pt. Pt is following Step 1 of the Therapeutic Lifestyle Changes diet. Pt wants to lose wt. Per discussing pt states he has lost 20 lb since his heart event. Per EMR, pt has lost ~14 lb over the past 7 months. Barriers to further dietary changes include pt feels he has made a lot of dietary changes and does not feel the need to change his diet at this time. Pt is diabetic. Pt checks CBG's 2 times/d. Pt checks fasting and bedtime  CBG's. Pt expressed understanding of the information reviewed. Pt aware of nutrition education classes offered and plans on attending nutrition classes.  Nutrition Diagnosis ? Food-and nutrition-related knowledge deficit related to lack of exposure to information as related to diagnosis of: ? CVD ? DM ? Obesity related to excessive energy intake as evidenced by a BMI of 31.4  Nutrition Intervention ? Pt's individual nutrition plan and goals reviewed with pt. ? Benefits of adopting Therapeutic Lifestyle Changes discussed when Medficts reviewed.    Nutrition Goal(s):  ? Pt to identify and limit food sources of saturated fat, trans fat, and sodium ? Improved blood glucose control as evidenced by pt's A1c trending toward 7.0 or less. ? Pt to identify food quantities necessary to achieve weight loss of 6-24 lb (2.7-10.9 kg) at graduation from cardiac rehab. Goal wt of 200 lb desired.  Plan:  Pt to attend nutrition classes ? Nutrition I ? Nutrition II - met 02/13/17 ? Portion Distortion ? Diabetes Blitz ? Diabetes Q & A Will provide client-centered nutrition education as part of interdisciplinary care.   Monitor and evaluate progress toward nutrition goal with team.  Derek Mound, M.Ed, RD, LDN, CDE 02/14/2017 7:41 AM

## 2017-02-14 NOTE — Progress Notes (Signed)
Cardiac Individual Treatment Plan  Patient Details  Name: Connor Reed MRN: [id number] Date of Birth: 04-Aug-1960 Referring Provider:     CARDIAC REHAB PHASE II ORIENTATION from 01/16/2017 in [facility] CARDIAC REHAB  Referring Provider  Donato Schultz, MD.      Initial Encounter Date:    CARDIAC REHAB PHASE II ORIENTATION from 01/16/2017 in [facility] CARDIAC REHAB  Date  01/16/17  Referring Provider  Donato Schultz, MD.      Visit Diagnosis: Stented coronary artery  NSTEMI (non-ST elevated myocardial infarction) [facility]  Patient's Home Medications on Admission:  Current Outpatient Prescriptions:  .  aspirin 81 MG chewable tablet, Chew 1 tablet (81 mg total) by mouth daily., Disp: 30 tablet, Rfl: 5 .  atorvastatin (LIPITOR) 40 MG tablet, Take 1 tablet (40 mg total) by mouth daily at 6 PM., Disp: 30 tablet, Rfl: 6 .  clopidogrel (PLAVIX) 75 MG tablet, Take 1 tablet (75 mg total) by mouth daily with breakfast., Disp: 30 tablet, Rfl: 11 .  Fluticasone-Salmeterol (ADVAIR DISKUS) 100-50 MCG/DOSE AEPB, Inhale 1 puff into the lungs 2 (two) times daily., Disp: 60 each, Rfl: 3 .  glucose blood (ONETOUCH VERIO) test strip, Test blood sugar two times a day. Diagnosis code E11.00, non-insulin dependent diabetes, Disp: 100 each, Rfl: 3 .  lisinopril (PRINIVIL,ZESTRIL) 5 MG tablet, Take 1 tablet (5 mg total) by mouth daily., Disp: 30 tablet, Rfl: 11 .  metFORMIN (GLUCOPHAGE) 500 MG tablet, Take 1 500 mg tablet twice a day., Disp: 60 tablet, Rfl: 5 .  nitroGLYCERIN (NITROSTAT) 0.4 MG SL tablet, Place 1 tablet (0.4 mg total) under the tongue every 5 (five) minutes as needed for chest pain., Disp: 25 tablet, Rfl: 3 .  ONETOUCH DELICA LANCETS FINE MISC, Use to check blood sugar two times daily. Diagnosis code:E11.00, Disp: 100 each, Rfl: 3 .  PROAIR HFA 108 (90 Base) MCG/ACT inhaler, Inhale 2 puffs into the lungs 4 (four) times daily as needed for wheezing., Disp: 18 g,  Rfl: 3  Past Medical History: Past Medical History:  Diagnosis Date  . Asthma   . Chronic bronchitis (HCC)   . Hypertension     Tobacco Use: History  Smoking Status  . Former Smoker  . Packs/day: 1.00  . Years: 20.00  . Quit date: 06/19/1990  Smokeless Tobacco  . Never Used    Labs: Recent Review Flowsheet Data    Labs for ITP Cardiac and Pulmonary Rehab Latest Ref Rng & Units 12/17/2016 12/18/2016   Cholestrol 0 - 200 mg/dL - 409   LDLCALC 0 - 99 mg/dL - 82   HDL >81 mg/dL - 19(J)   Trlycerides <478 mg/dL - 295(A)   Hemoglobin O1H 4.8 - 5.6 % 10.3(H) -      Capillary Blood Glucose: Lab Results  Component Value Date   GLUCAP 119 (H) 02/12/2017   GLUCAP 112 (H) 01/26/2017   GLUCAP 168 (H) 01/24/2017   GLUCAP 128 (H) 01/24/2017   GLUCAP 208 (H) 12/20/2016     Exercise Target Goals:    Exercise Program Goal: Individual exercise prescription set with THRR, safety & activity barriers. Participant demonstrates ability to understand and report RPE using BORG scale, to self-measure pulse accurately, and to acknowledge the importance of the exercise prescription.  Exercise Prescription Goal: Starting with aerobic activity 30 plus minutes a day, 3 days per week for initial exercise prescription. Provide home exercise prescription and guidelines that participant acknowledges understanding prior to discharge.  Activity  Barriers & Risk Stratification:     Activity Barriers & Cardiac Risk Stratification - 01/16/17 1135      Activity Barriers & Cardiac Risk Stratification   Activity Barriers Arthritis;Other (comment)   Comments Left hip pain   Cardiac Risk Stratification High      6 Minute Walk:     6 Minute Walk    Row Name 01/16/17 1134         6 Minute Walk   Phase Initial     Distance 1636 feet     Walk Time 6 minutes     # of Rest Breaks 0     MPH 3.1     METS 4.04     RPE 11     VO2 Peak 14.13     Symptoms No     Resting HR 73 bpm     Resting BP  100/60     Max Ex. HR 120 bpm     Max Ex. BP 112/60     2 Minute Post BP 110/62        Oxygen Initial Assessment:   Oxygen Re-Evaluation:   Oxygen Discharge (Final Oxygen Re-Evaluation):   Initial Exercise Prescription:     Initial Exercise Prescription - 01/16/17 1100      Date of Initial Exercise RX and Referring Provider   Date 01/16/17   Referring Provider Donato SchultzSkains, Mark, MD.     Treadmill   MPH 3.4   Grade 0   Minutes 10   METs 3.6     Bike   Level 1.7   Minutes 10   METs 4.13     NuStep   Level 4   SPM 95   Minutes 10   METs 3.3     Prescription Details   Frequency (times per week) 3   Duration Progress to 30 minutes of continuous aerobic without signs/symptoms of physical distress     Intensity   THRR 40-80% of Max Heartrate 65-131   Ratings of Perceived Exertion 11-13   Perceived Dyspnea 0-4     Progression   Progression Continue to progress workloads to maintain intensity without signs/symptoms of physical distress.     Resistance Training   Training Prescription Yes   Weight 4lbs   Reps 10-15      Perform Capillary Blood Glucose checks as needed.  Exercise Prescription Changes:     Exercise Prescription Changes    Row Name 01/24/17 1700 02/07/17 1800 02/09/17 0900         Response to Exercise   Blood Pressure (Admit) 124/76 122/74 120/80     Blood Pressure (Exercise) 170/84 140/68 146/82     Blood Pressure (Exit) 114/70 110/80 120/82     Heart Rate (Admit) 76 bpm 76 bpm 65 bpm     Heart Rate (Exercise) 120 bpm 113 bpm 128 bpm     Heart Rate (Exit) 84 bpm 74 bpm 75 bpm     Symptoms none none none     Comments Off to a good start with exercise.  -  -     Duration Continue with 30 min of aerobic exercise without signs/symptoms of physical distress. Continue with 30 min of aerobic exercise without signs/symptoms of physical distress. Continue with 30 min of aerobic exercise without signs/symptoms of physical distress.     Intensity  THRR unchanged THRR unchanged THRR unchanged       Progression   Progression Continue to progress workloads to maintain intensity without signs/symptoms of  physical distress. Continue to progress workloads to maintain intensity without signs/symptoms of physical distress. Continue to progress workloads to maintain intensity without signs/symptoms of physical distress.     Average METs 3.6 3.6 3.6       Resistance Training   Training Prescription Yes Yes Yes     Weight 4lbs 5lbs 5lbs     Reps 10-15 10-15 10-15       Interval Training   Interval Training No No No       Treadmill   MPH 3.4 3.4 3.4     Grade 0 0 0     Minutes METs 3.6 3.6 3.6       Bike   Level 1.7 1.7 1.7     Minutes METs 4.1 4.11 4.1       NuStep   Level SPM 95 95 95     Minutes METs 3.2 2.8 3.2       Home Exercise Plan   Plans to continue exercise at  -  - Home (comment)     Frequency  -  - Add 3 additional days to program exercise sessions.     Initial Home Exercises Provided  -  - 02/09/17        Exercise Comments:     Exercise Comments    Row Name 01/24/17 1729 02/07/17 1810 02/09/17 0920       Exercise Comments Off to a good start with exercise. Reviewed METs and goals. Reviewed home exercise guidelines. Pt has a treadmill at home that he is using 45-60 minutes, 2-3 days/week.        Exercise Goals and Review:     Exercise Goals    Row Name 01/16/17 1135             Exercise Goals   Increase Physical Activity Yes       Intervention Provide advice, education, support and counseling about physical activity/exercise needs.;Develop an individualized exercise prescription for aerobic and resistive training based on initial evaluation findings, risk stratification, comorbidities and participant's personal goals.       Expected Outcomes Achievement of increased cardiorespiratory fitness and enhanced flexibility, muscular endurance and strength  shown through measurements of functional capacity and personal statement of participant.       Increase Strength and Stamina Yes       Intervention Provide advice, education, support and counseling about physical activity/exercise needs.;Develop an individualized exercise prescription for aerobic and resistive training based on initial evaluation findings, risk stratification, comorbidities and participant's personal goals.       Expected Outcomes Achievement of increased cardiorespiratory fitness and enhanced flexibility, muscular endurance and strength shown through measurements of functional capacity and personal statement of participant.          Exercise Goals Re-Evaluation :     Exercise Goals Re-Evaluation    Row Name 02/07/17 1809             Exercise Goal Re-Evaluation   Exercise Goals Review Increase Physical Activity;Increase Strength and Stamina       Comments Pt sstates that he is "feeling a whole lot better". Making good progress with exercise. Pt is also back to golfing.       Expected Outcomes Continue exercise progression to see improvement in overall increase in stamina and help achieve weight loss goals.  Discharge Exercise Prescription (Final Exercise Prescription Changes):     Exercise Prescription Changes - 02/09/17 0900      Response to Exercise   Blood Pressure (Admit) 120/80   Blood Pressure (Exercise) 146/82   Blood Pressure (Exit) 120/82   Heart Rate (Admit) 65 bpm   Heart Rate (Exercise) 128 bpm   Heart Rate (Exit) 75 bpm   Symptoms none   Duration Continue with 30 min of aerobic exercise without signs/symptoms of physical distress.   Intensity THRR unchanged     Progression   Progression Continue to progress workloads to maintain intensity without signs/symptoms of physical distress.   Average METs 3.6     Resistance Training   Training Prescription Yes   Weight 5lbs   Reps 10-15     Interval Training   Interval Training No      Treadmill   MPH 3.4   Grade 0   Minutes 10   METs 3.6     Bike   Level 1.7   Minutes 10   METs 4.1     NuStep   Level 5   SPM 95   Minutes 10   METs 3.2     Home Exercise Plan   Plans to continue exercise at Home (comment)   Frequency Add 3 additional days to program exercise sessions.   Initial Home Exercises Provided 02/09/17      Nutrition:  Target Goals: Understanding of nutrition guidelines, daily intake of sodium 1500mg , cholesterol 200mg , calories 30% from fat and 7% or less from saturated fats, daily to have 5 or more servings of fruits and vegetables.  Biometrics:     Pre Biometrics - 01/16/17 1137      Pre Biometrics   Height 5' 11.5" (1.816 m)   Weight 228 lb 2.8 oz (103.5 kg)   Waist Circumference 44.75 inches   Hip Circumference 41 inches   Waist to Hip Ratio 1.09 %   BMI (Calculated) 31.4   Triceps Skinfold 23 mm   % Body Fat 32.2 %   Grip Strength 49 kg   Flexibility 8 in   Single Leg Stand 30 seconds       Nutrition Therapy Plan and Nutrition Goals:     Nutrition Therapy & Goals - 01/16/17 1558      Nutrition Therapy   Diet Carb Modified, Therapeutic Lifestyle Changes     Personal Nutrition Goals   Nutrition Goal Pt to identify and limit food sources of saturated fat, trans fat, and sodium   Personal Goal #2 Improved blood glucose control as evidenced by pt's A1c trending toward 7.0 or less.   Personal Goal #3 Pt to identify food quantities necessary to achieve weight loss of 6-24 lb (2.7-10.9 kg) at graduation from cardiac rehab. Goal wt of 200 lb desired.      Intervention Plan   Intervention Prescribe, educate and counsel regarding individualized specific dietary modifications aiming towards targeted core components such as weight, hypertension, lipid management, diabetes, heart failure and other comorbidities.   Expected Outcomes Short Term Goal: Understand basic principles of dietary content, such as calories, fat, sodium,  cholesterol and nutrients.;Long Term Goal: Adherence to prescribed nutrition plan.      Nutrition Discharge: Nutrition Scores:     Nutrition Assessments - 01/16/17 1558      MEDFICTS Scores   Pre Score 52      Nutrition Goals Re-Evaluation:   Nutrition Goals Re-Evaluation:   Nutrition Goals Discharge (Final Nutrition Goals Re-Evaluation):  Psychosocial: Target Goals: Acknowledge presence or absence of significant depression and/or stress, maximize coping skills, provide positive support system. Participant is able to verbalize types and ability to use techniques and skills needed for reducing stress and depression.  Initial Review & Psychosocial Screening:     Initial Psych Review & Screening - 01/16/17 1430      Initial Review   Current issues with None Identified     Family Dynamics   Good Support System? Yes     Barriers   Psychosocial barriers to participate in program The patient should benefit from training in stress management and relaxation.      Quality of Life Scores:     Quality of Life - 01/16/17 1145      Quality of Life Scores   Health/Function Pre 27.2 %   Socioeconomic Pre 28.79 %   Psych/Spiritual Pre 27.43 %   Family Pre 22.8 %   GLOBAL Pre 26.93 %      PHQ-9: Recent Review Flowsheet Data    Depression screen [facility] 2/9 02/12/2017 02/12/2017 01/24/2017 12/27/2016   Decreased Interest 0 0 0 0   Down, Depressed, Hopeless 0 0 0 0   PHQ - 2 Score 0 0 0 0     Interpretation of Total Score  Total Score Depression Severity:  1-4 = Minimal depression, 5-9 = Mild depression, 10-14 = Moderate depression, 15-19 = Moderately severe depression, 20-27 = Severe depression   Psychosocial Evaluation and Intervention:     Psychosocial Evaluation - 02/12/17 0812      Psychosocial Evaluation & Interventions   Interventions Encouraged to exercise with the program and follow exercise prescription;Stress management education   Comments no psychosocial  needs identified, no interventions necessary    Expected Outcomes pt will exhibit positive outlook with good coping skills.    Continue Psychosocial Services  No Follow up required      Psychosocial Re-Evaluation:     Psychosocial Re-Evaluation    Row Name 02/12/17 0813             Psychosocial Re-Evaluation   Current issues with None Identified       Comments no psychosocial needs identified, no interventions necessary        Expected Outcomes pt will exhibit positive outlook with good coping skills.        Interventions Stress management education;Encouraged to attend Cardiac Rehabilitation for the exercise       Continue Psychosocial Services  No Follow up required          Psychosocial Discharge (Final Psychosocial Re-Evaluation):     Psychosocial Re-Evaluation - 02/12/17 0813      Psychosocial Re-Evaluation   Current issues with None Identified   Comments no psychosocial needs identified, no interventions necessary    Expected Outcomes pt will exhibit positive outlook with good coping skills.    Interventions Stress management education;Encouraged to attend Cardiac Rehabilitation for the exercise   Continue Psychosocial Services  No Follow up required      Vocational Rehabilitation: Provide vocational rehab assistance to qualifying candidates.   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 Participant will state understanding/return demonstration of  topics presented.  Learning Barriers/Preferences:     Learning Barriers/Preferences - 01/16/17 1139      Learning Barriers/Preferences   Learning Barriers None   Learning Preferences  Audio;Computer/Internet;Group Instruction;Individual Instruction;Pictoral;Skilled Demonstration;Verbal Instruction;Video;Written Material      Education Topics: Count Your Pulse:  -Group instruction provided by verbal instruction, demonstration, patient participation and written materials to support subject.  Instructors address importance of being able to find your pulse and how to count your pulse when at home without a heart monitor.  Patients get hands on experience counting their pulse with staff help and individually.   CARDIAC REHAB PHASE II EXERCISE from 02/12/2017 in [facility] CARDIAC REHAB  Date  02/09/17  Instruction Review Code  2- meets goals/outcomes      Heart Attack, Angina, and Risk Factor Modification:  -Group instruction provided by verbal instruction, video, and written materials to support subject.  Instructors address signs and symptoms of angina and heart attacks.    Also discuss risk factors for heart disease and how to make changes to improve heart health risk factors.   Functional Fitness:  -Group instruction provided by verbal instruction, demonstration, patient participation, and written materials to support subject.  Instructors address safety measures for doing things around the house.  Discuss how to get up and down off the floor, how to pick things up properly, how to safely get out of a chair without assistance, and balance training.   Meditation and Mindfulness:  -Group instruction provided by verbal instruction, patient participation, and written materials to support subject.  Instructor addresses importance of mindfulness and meditation practice to help reduce stress and improve awareness.  Instructor also leads participants through a meditation exercise.    Stretching for Flexibility and Mobility:  -Group instruction provided by verbal instruction, patient participation, and written materials to support subject.  Instructors lead  participants through series of stretches that are designed to increase flexibility thus improving mobility.  These stretches are additional exercise for major muscle groups that are typically performed during regular warm up and cool down.   Hands Only CPR:  -Group verbal, video, and participation provides a basic overview of AHA guidelines for community CPR. Role-play of emergencies allow participants the opportunity to practice calling for help and chest compression technique with discussion of AED use.   Hypertension: -Group verbal and written instruction that provides a basic overview of hypertension including the most recent diagnostic guidelines, risk factor reduction with self-care instructions and medication management.    Nutrition I class: Heart Healthy Eating:  -Group instruction provided by PowerPoint slides, verbal discussion, and written materials to support subject matter. The instructor gives an explanation and review of the Therapeutic Lifestyle Changes diet recommendations, which includes a discussion on lipid goals, dietary fat, sodium, fiber, plant stanol/sterol esters, sugar, and the components of a well-balanced, healthy diet.   Nutrition II class: Lifestyle Skills:  -Group instruction provided by PowerPoint slides, verbal discussion, and written materials to support subject matter. The instructor gives an explanation and review of label reading, grocery shopping for heart health, heart healthy recipe modifications, and ways to make healthier choices when eating out.   CARDIAC REHAB PHASE II EXERCISE from 02/12/2017 in [facility] CARDIAC REHAB  Date  02/13/17  Educator  RD  Instruction Review Code  2- meets goals/outcomes      Diabetes Question & Answer:  -Group instruction provided by PowerPoint slides, verbal discussion, and written materials to support subject matter. The instructor gives  an explanation and review of diabetes co-morbidities, pre-  and post-prandial blood glucose goals, pre-exercise blood glucose goals, signs, symptoms, and treatment of hypoglycemia and hyperglycemia, and foot care basics.   Diabetes Blitz:  -Group instruction provided by PowerPoint slides, verbal discussion, and written materials to support subject matter. The instructor gives an explanation and review of the physiology behind type 1 and type 2 diabetes, diabetes medications and rational behind using different medications, pre- and post-prandial blood glucose recommendations and Hemoglobin A1c goals, diabetes diet, and exercise including blood glucose guidelines for exercising safely.    Portion Distortion:  -Group instruction provided by PowerPoint slides, verbal discussion, written materials, and food models to support subject matter. The instructor gives an explanation of serving size versus portion size, changes in portions sizes over the last 20 years, and what consists of a serving from each food group.   Stress Management:  -Group instruction provided by verbal instruction, video, and written materials to support subject matter.  Instructors review role of stress in heart disease and how to cope with stress positively.     Exercising on Your Own:  -Group instruction provided by verbal instruction, power point, and written materials to support subject.  Instructors discuss benefits of exercise, components of exercise, frequency and intensity of exercise, and end points for exercise.  Also discuss use of nitroglycerin and activating EMS.  Review options of places to exercise outside of rehab.  Review guidelines for sex with heart disease.   Cardiac Drugs I:  -Group instruction provided by verbal instruction and written materials to support subject.  Instructor reviews cardiac drug classes: antiplatelets, anticoagulants, beta blockers, and statins.  Instructor discusses reasons, side effects, and lifestyle considerations for each drug  class.   Cardiac Drugs II:  -Group instruction provided by verbal instruction and written materials to support subject.  Instructor reviews cardiac drug classes: angiotensin converting enzyme inhibitors (ACE-I), angiotensin II receptor blockers (ARBs), nitrates, and calcium channel blockers.  Instructor discusses reasons, side effects, and lifestyle considerations for each drug class.   Anatomy and Physiology of the Circulatory System:  Group verbal and written instruction and models provide basic cardiac anatomy and physiology, with the coronary electrical and arterial systems. Review of: AMI, Angina, Valve disease, Heart Failure, Peripheral Artery Disease, Cardiac Arrhythmia, Pacemakers, and the ICD.   Other Education:  -Group or individual verbal, written, or video instructions that support the educational goals of the cardiac rehab program.   Knowledge Questionnaire Score:   Core Components/Risk Factors/Patient Goals at Admission:     Personal Goals and Risk Factors at Admission - 01/16/17 1232      Core Components/Risk Factors/Patient Goals on Admission    Weight Management Yes;Obesity   Intervention Weight Management/Obesity: Establish reasonable short term and long term weight goals.;Obesity: Provide education and appropriate resources to help participant work on and attain dietary goals.;Weight Management: Provide education and appropriate resources to help participant work on and attain dietary goals.;Weight Management: Develop a combined nutrition and exercise program designed to reach desired caloric intake, while maintaining appropriate intake of nutrient and fiber, sodium and fats, and appropriate energy expenditure required for the weight goal.   Admit Weight 228 lb 2.8 oz (103.5 kg)   Goal Weight: Short Term 222 lb 3.2 oz (100.8 kg)   Goal Weight: Long Term 203 lb 3.2 oz (92.2 kg)   Expected Outcomes Short Term: Continue to assess and modify interventions until short term  weight is achieved;Long Term: Adherence to nutrition and physical activity/exercise  program aimed toward attainment of established weight goal;Weight Loss: Understanding of general recommendations for a balanced deficit meal plan, which promotes 1-2 lb weight loss per week and includes a negative energy balance of [phone] kcal/d   Diabetes Yes   Intervention Provide education about signs/symptoms and action to take for hypo/hyperglycemia.;Provide education about proper nutrition, including hydration, and aerobic/resistive exercise prescription along with prescribed medications to achieve blood glucose in normal ranges: Fasting glucose 65-99 mg/dL   Expected Outcomes Short Term: Participant verbalizes understanding of the signs/symptoms and immediate care of hyper/hypoglycemia, proper foot care and importance of medication, aerobic/resistive exercise and nutrition plan for blood glucose control.;Long Term: Attainment of HbA1C < 7%.   Hypertension Yes   Intervention Provide education on lifestyle modifcations including regular physical activity/exercise, weight management, moderate sodium restriction and increased consumption of fresh fruit, vegetables, and low fat dairy, alcohol moderation, and smoking cessation.;Monitor prescription use compliance.   Expected Outcomes Short Term: Continued assessment and intervention until BP is < 140/80mm HG in hypertensive participants. < 130/83mm HG in hypertensive participants with diabetes, heart failure or chronic kidney disease.;Long Term: Maintenance of blood pressure at goal levels.   Lipids Yes   Intervention Provide education and support for participant on nutrition & aerobic/resistive exercise along with prescribed medications to achieve LDL 70mg , HDL >40mg .   Expected Outcomes Short Term: Participant states understanding of desired cholesterol values and is compliant with medications prescribed. Participant is following exercise prescription and nutrition  guidelines.;Long Term: Cholesterol controlled with medications as prescribed, with individualized exercise RX and with personalized nutrition plan. Value goals: LDL < , HDL > 40 mg.   Personal Goal Other Yes   Personal Goal Get back to normal self.   Intervention Provide exercise routine to help improve energy, strength, and stamina, so that patient can return to previous activities.   Expected Outcomes Patient will resume normal activites and "get back to normal self" as eveidenced by patient personal statement.      Core Components/Risk Factors/Patient Goals Review:      Goals and Risk Factor Review    Row Name 02/12/17 [phone]             Core Components/Risk Factors/Patient Goals Review   Personal Goals Review Weight Management/Obesity;Diabetes;Hypertension;Lipids;Other       Review pt with muliple CAD RF demonstrates eagerness to participate in CR activities.       Expected Outcomes pt will participate in CR exercise, nutrition and lifestyle modification education to decrease overall RF.           Core Components/Risk Factors/Patient Goals at Discharge (Final Review):      Goals and Risk Factor Review - 02/12/17 0811      Core Components/Risk Factors/Patient Goals Review   Personal Goals Review Weight Management/Obesity;Diabetes;Hypertension;Lipids;Other   Review pt with muliple CAD RF demonstrates eagerness to participate in CR activities.   Expected Outcomes pt will participate in CR exercise, nutrition and lifestyle modification education to decrease overall RF.       ITP Comments:     ITP Comments    Row Name 01/16/17 0950 01/17/17 1642 02/14/17 0821       ITP Comments Dr. Armanda Magic, Medical Director  Dr. Armanda Magic, Medical Director  Dr. Armanda Magic, Medical Director         Comments: Pt is making expected progress toward personal goals after completing 8 sessions. Recommend continued exercise and life style modification education including  stress  management and relaxation techniques to decrease  cardiac risk profile.

## 2017-02-16 ENCOUNTER — Encounter (HOSPITAL_COMMUNITY)
Admission: RE | Admit: 2017-02-16 | Discharge: 2017-02-16 | Disposition: A | Discharge status: Home / Self Care | Payer: Federal, State, Local not specified - PPO | Source: Ambulatory Visit | Attending: Cardiology | Admitting: Cardiology

## 2017-02-16 DIAGNOSIS — Z955 Presence of coronary angioplasty implant and graft: Secondary | ICD-10-CM

## 2017-02-16 DIAGNOSIS — I214 Non-ST elevation (NSTEMI) myocardial infarction: Secondary | ICD-10-CM | POA: Diagnosis not present

## 2017-02-19 ENCOUNTER — Encounter (HOSPITAL_COMMUNITY)
Admission: RE | Admit: 2017-02-19 | Discharge: 2017-02-19 | Disposition: A | Discharge status: Home / Self Care | Payer: Federal, State, Local not specified - PPO | Source: Ambulatory Visit | Attending: Cardiology | Admitting: Cardiology

## 2017-02-19 DIAGNOSIS — Z955 Presence of coronary angioplasty implant and graft: Secondary | ICD-10-CM

## 2017-02-19 DIAGNOSIS — I214 Non-ST elevation (NSTEMI) myocardial infarction: Secondary | ICD-10-CM

## 2017-02-19 NOTE — Assessment & Plan Note (Addendum)
The patient has been taking his inhalers backwards, as he has been using the "purple" inhaler (Advair) as a rescue inhaler and the "red" inhaler (proair, albuterol) as a daily maintenance medication. The patient was instructed how to properly take this medication in the future.   Plain: -Continue daily Advair use and PRN albuterol use

## 2017-02-21 ENCOUNTER — Encounter: Payer: Self-pay | Admitting: Nurse Practitioner

## 2017-02-21 ENCOUNTER — Ambulatory Visit (INDEPENDENT_AMBULATORY_CARE_PROVIDER_SITE_OTHER): Payer: Federal, State, Local not specified - PPO | Admitting: Nurse Practitioner

## 2017-02-21 ENCOUNTER — Encounter (HOSPITAL_COMMUNITY)
Admission: RE | Admit: 2017-02-21 | Discharge: 2017-02-21 | Disposition: A | Discharge status: Home / Self Care | Payer: Federal, State, Local not specified - PPO | Source: Ambulatory Visit | Attending: Cardiology | Admitting: Cardiology

## 2017-02-21 VITALS — BP 112/80 | HR 79 | Ht 72.0 in | Wt 227.8 lb

## 2017-02-21 DIAGNOSIS — Z955 Presence of coronary angioplasty implant and graft: Secondary | ICD-10-CM

## 2017-02-21 DIAGNOSIS — I214 Non-ST elevation (NSTEMI) myocardial infarction: Secondary | ICD-10-CM

## 2017-02-21 DIAGNOSIS — I10 Essential (primary) hypertension: Secondary | ICD-10-CM

## 2017-02-21 NOTE — Patient Instructions (Addendum)
We will be checking the following labs today - NONE  Fasting labs on Friday - BMET, CBC HPF and Lipids   Medication Instructions:    Continue with your current medicines.     Testing/Procedures To Be Arranged:  N/A  Follow-Up:   See Dr. Anne Fu in November as planned.     Other Special Instructions:   Keep up the good work!    If you need a refill on your cardiac medications before your next appointment, please call your pharmacy.   Call the [facility] office at [phone] if you have any questions, problems or concerns.

## 2017-02-21 NOTE — Progress Notes (Signed)
CARDIOLOGY OFFICE NOTE  Date:  02/21/2017    Connor Reed Date of Birth: 04-20-1961 Medical Record #[id number]  PCP:  Thomasene Ripple, MD  Cardiologist:  Marisa Cyphers    Chief Complaint  Patient presents with  . Coronary Artery Disease    Follow up visit - seen for Dr. Marlou Porch    History of Present Illness: Connor Reed is a 56 y.o. male who presents today for a follow up visit. Seen for Dr. Marlou Porch.   He has a history of HTN and uncontrolled DM. Previously no known CAD. Former smoker.   Presented mid July with chest pain - troponins +. He was referred for cardiac cath and found to have 99% occlusion of the mid LAD and 80% stenosis in mid LCX. Two DES stents were placed. DAPT therapy initiated. Moderate LV dysfunction at time of cath but echo 2 days later with normal EF noted.   I saw him about 6 weeks ago for his post hospital visit - was doing well - had really gotten on track with his health/modifying risk factors, etc. No chest pain noted.   Comes in today. Here with his wife. He continues to do very well. No chest pain. Not short of breath. Loves cardiac rehab. He is working on his weight and trying to get his blood sugars down. He is happy with how he is doing. Not smoking.   Past Medical History:  Diagnosis Date  . Asthma   . Chronic bronchitis ([place])   . Hypertension     Past Surgical History:  Procedure Laterality Date  . LEFT HEART CATH AND CORONARY ANGIOGRAPHY N/A 12/18/2016   Procedure: Left Heart Cath and Coronary Angiography;  Surgeon: Belva Crome, MD;  Location: [facility];  Service: Cardiovascular;  Laterality: N/A;     Medications: Current Meds  Medication Sig  . aspirin 81 MG chewable tablet Chew 1 tablet (81 mg total) by mouth daily.  Marland Kitchen atorvastatin (LIPITOR) 40 MG tablet Take 1 tablet (40 mg total) by mouth daily at 6 PM.  . clopidogrel (PLAVIX) 75 MG tablet Take 1 tablet (75 mg total) by mouth daily with breakfast.  .  Fluticasone-Salmeterol (ADVAIR DISKUS) 100-50 MCG/DOSE AEPB Inhale 1 puff into the lungs 2 (two) times daily.  Marland Kitchen glucose blood (ONETOUCH VERIO) test strip Test blood sugar two times a day. Diagnosis code E11.00, non-insulin dependent diabetes  . lisinopril (PRINIVIL,ZESTRIL) 5 MG tablet Take 1 tablet (5 mg total) by mouth daily.  . metFORMIN (GLUCOPHAGE) 500 MG tablet Take 1 500 mg tablet twice a day.  . nitroGLYCERIN (NITROSTAT) 0.4 MG SL tablet Place 1 tablet (0.4 mg total) under the tongue every 5 (five) minutes as needed for chest pain.  Glory Rosebush DELICA LANCETS FINE MISC Use to check blood sugar two times daily. Diagnosis code:E11.00  . PROAIR HFA 108 (90 Base) MCG/ACT inhaler Inhale 2 puffs into the lungs 4 (four) times daily as needed for wheezing.     Allergies: Allergies  Allergen Reactions  . Acetaminophen Itching and Rash  . Penicillins Rash, Shortness Of Breath and Swelling    Social History: The patient  reports that he quit smoking about 26 years ago. He has a 20.00 pack-year smoking history. He has never used smokeless tobacco. He reports that he drinks alcohol. He reports that he does not use drugs.   Family History: The patient's family history includes Cancer in his father; Diabetes in his brother; Heart attack  in his maternal grandfather.   Review of Systems: Please see the history of present illness.   Otherwise, the review of systems is positive for none.   All other systems are reviewed and negative.   Physical Exam: VS:  BP 112/80 (BP Location: Left Arm, Patient Position: Sitting, Cuff Size: Normal)   Pulse 79   Ht 6' (1.829 m)   Wt 227 lb 12.8 oz (103.3 kg)   SpO2 95% Comment: at rest  BMI 30.90 kg/m  .  BMI Body mass index is 30.9 kg/m.  Wt Readings from Last 3 Encounters:  02/21/17 227 lb 12.8 oz (103.3 kg)  02/12/17 233 lb 9.6 oz (106 kg)  01/16/17 228 lb 2.8 oz (103.5 kg)    General: Pleasant. Well developed, well nourished and in no acute  distress.   HEENT: Normal.  Neck: Supple, no JVD, carotid bruits, or masses noted.  Cardiac: Regular rate and rhythm. No murmurs, rubs, or gallops. No edema.  Respiratory:  Lungs are clear to auscultation bilaterally with normal work of breathing.  GI: Soft and nontender.  MS: No deformity or atrophy. Gait and ROM intact.  Skin: Warm and dry. Color is normal.  Neuro:  Strength and sensation are intact and no gross focal deficits noted.  Psych: Alert, appropriate and with normal affect.   LABORATORY DATA:  EKG:  EKG is not ordered today.  Lab Results  Component Value Date   WBC 10.8 01/03/2017   HGB 15.1 01/03/2017   HCT 43.3 01/03/2017   PLT 330 01/03/2017   GLUCOSE 114 (H) 01/03/2017   CHOL 161 12/18/2016   TRIG 238 (H) 12/18/2016   HDL 31 (L) 12/18/2016   LDLCALC 82 12/18/2016   ALT 30 01/03/2017   AST 16 01/03/2017   NA 136 01/03/2017   K 4.8 01/03/2017   CL 100 01/03/2017   CREATININE 1.03 01/03/2017   BUN 14 01/03/2017   CO2 21 01/03/2017   TSH 1.461 12/18/2016   INR 1.09 12/18/2016   HGBA1C 10.3 (H) 12/17/2016     BNP (last 3 results) No results for input(s): BNP in the last 8760 hours.  ProBNP (last 3 results) No results for input(s): PROBNP in the last 8760 hours.   Other Studies Reviewed Today:  Echo Study Conclusions 12/2016  - Left ventricle: The cavity size was normal. Wall thickness was normal. Systolic function was normal. The estimated ejection fraction was in the range of 55% to 60%. Left ventricular diastolic function parameters were normal.   Left Heart Cath and Coronary Angiography 12/2016  Conclusion    Non-ST elevation anterior wall myocardial infarction with vague ongoing chest pain greater than 24 hours.  Subtotal occlusion thrombotic obstruction in the mid LAD which is also supplied by collaterals from the right coronary.  Successful angioplasty and stenting of the mid LAD from 99.9% with TIMI grade one flow to 0% with  TIMI grade 3 flow using Onyx 3.5 x 18 DES postdilated to 3.75 mm in diameter.  Successful direct stenting of the mid circumflex 80% stenosis to 0% with TIMI grade 3 flow using a 3.5 x 12 mm Onyx deployed at 14 atm.  30-40% proximal to mid RCA which is noted above supplied collaterals to the apical and mid LAD.  Left ventricular systolic dysfunction with anteroapical severe hypokinesis, ejection fraction 40%, and elevated LVEDP consistent with acute combined systolic and diastolic heart failure.  RECOMMENDATIONS:   Aggrastat will be continued for 10 hours.  Aspirin and Plavix 1 year.  Aggressive risk factor modification.  Contemplate discharge within the next 24 hours after guidelines based management of acute combined systolic and diastolic heart failure.      Assessment/Plan:  1: Non-STEMI- recent NSTEMI with troponin peak of 6.31. Cardiac cath revealed subtotal mid LAD which was stented as well as AV groove circumflex. Now s/p PCI x 2 with DES - EF was 40% with an anteroapical wall motion. EF however was normal by echo. He is on beta blocker and ACE along with being on DAPT - he continues to do very well. No changes made today. Continue with cardiac rehab and overall CV risk factor modification.   2: Essential hypertension- - on Coreg and ACE - BP looks good. No changes made today.   3: Ischemic cardiomyopathy - EF was 40% by cardiac catheterization with an elevated LVEDP. On ACE and beta blocker. EF by echo was normal. Would continue with his current regimen.  4. HLD - fasting labs later this week  5. Obesity - he is actively working on this - seems very motivated.   Current medicines are reviewed with the patient today.  The patient does not have concerns regarding medicines other than what has been noted above.  The following changes have been made:  See above.  Labs/ tests ordered today include:    Orders Placed This Encounter  Procedures  . Basic  metabolic panel  . CBC  . Hepatic function panel  . Lipid panel     Disposition:   FU with Dr. Marlou Porch in November. I will be happy to see back as needed.   Patient is agreeable to this plan and will call if any problems develop in the interim.   SignedTruitt Merle, NP  02/21/2017 3:13 PM  [facility] [address] [place], [place]  [address] Phone: [phone] Fax: [phone]

## 2017-02-23 ENCOUNTER — Encounter (HOSPITAL_COMMUNITY)
Admission: RE | Admit: 2017-02-23 | Discharge: 2017-02-23 | Disposition: A | Discharge status: Home / Self Care | Payer: Federal, State, Local not specified - PPO | Source: Ambulatory Visit | Attending: Cardiology | Admitting: Cardiology

## 2017-02-23 ENCOUNTER — Other Ambulatory Visit: Payer: Federal, State, Local not specified - PPO | Admitting: *Deleted

## 2017-02-23 DIAGNOSIS — Z955 Presence of coronary angioplasty implant and graft: Secondary | ICD-10-CM | POA: Diagnosis not present

## 2017-02-23 DIAGNOSIS — I214 Non-ST elevation (NSTEMI) myocardial infarction: Secondary | ICD-10-CM | POA: Diagnosis not present

## 2017-02-23 DIAGNOSIS — I10 Essential (primary) hypertension: Secondary | ICD-10-CM

## 2017-02-23 LAB — BASIC METABOLIC PANEL
BUN/Creatinine Ratio: 12 (ref 9–20)
BUN: 13 mg/dL (ref 6–24)
CO2: 20 mmol/L (ref 20–29)
Calcium: 9.3 mg/dL (ref 8.7–10.2)
Chloride: 103 mmol/L (ref 96–106)
Creatinine, Ser: 1.08 mg/dL (ref 0.76–1.27)
GFR calc Af Amer: 88 mL/min/{1.73_m2} (ref >59)
GFR calc non Af Amer: 76 mL/min/{1.73_m2} (ref >59)
Glucose: 129 mg/dL — ABNORMAL HIGH (ref 65–99)
Potassium: 4.2 mmol/L (ref 3.5–5.2)
Sodium: 139 mmol/L (ref 134–144)

## 2017-02-23 LAB — LIPID PANEL
Chol/HDL Ratio: 3.9 ratio (ref 0.0–5.0)
Cholesterol, Total: 124 mg/dL (ref 100–199)
HDL: 32 mg/dL — ABNORMAL LOW (ref >39)
LDL Calculated: 71 mg/dL (ref 0–99)
Triglycerides: 103 mg/dL (ref 0–149)
VLDL Cholesterol Cal: 21 mg/dL (ref 5–40)

## 2017-02-23 LAB — CBC
Hematocrit: 41.8 % (ref 37.5–51.0)
Hemoglobin: 14.4 g/dL (ref 13.0–17.7)
MCH: 29.9 pg (ref 26.6–33.0)
MCHC: 34.4 g/dL (ref 31.5–35.7)
MCV: 87 fL (ref 79–97)
Platelets: 247 10*3/uL (ref 150–379)
RBC: 4.82 x10E6/uL (ref 4.14–5.80)
RDW: 13.4 % (ref 12.3–15.4)
WBC: 7.5 10*3/uL (ref 3.4–10.8)

## 2017-02-23 LAB — HEPATIC FUNCTION PANEL
ALT: 22 IU/L (ref 0–44)
AST: 13 IU/L (ref 0–40)
Albumin: 4.4 g/dL (ref 3.5–5.5)
Alkaline Phosphatase: 71 IU/L (ref 39–117)
Bilirubin Total: 1.1 mg/dL (ref 0.0–1.2)
Bilirubin, Direct: 0.29 mg/dL (ref 0.00–0.40)
Total Protein: 6.9 g/dL (ref 6.0–8.5)

## 2017-02-26 ENCOUNTER — Encounter (HOSPITAL_COMMUNITY)
Admission: RE | Admit: 2017-02-26 | Discharge: 2017-02-26 | Disposition: A | Discharge status: Home / Self Care | Payer: Federal, State, Local not specified - PPO | Source: Ambulatory Visit | Attending: Cardiology | Admitting: Cardiology

## 2017-02-26 DIAGNOSIS — I214 Non-ST elevation (NSTEMI) myocardial infarction: Secondary | ICD-10-CM | POA: Diagnosis not present

## 2017-02-26 DIAGNOSIS — Z955 Presence of coronary angioplasty implant and graft: Secondary | ICD-10-CM

## 2017-02-28 ENCOUNTER — Encounter (HOSPITAL_COMMUNITY)
Admission: RE | Admit: 2017-02-28 | Discharge: 2017-02-28 | Disposition: A | Discharge status: Home / Self Care | Payer: Federal, State, Local not specified - PPO | Source: Ambulatory Visit | Attending: Cardiology | Admitting: Cardiology

## 2017-02-28 DIAGNOSIS — I214 Non-ST elevation (NSTEMI) myocardial infarction: Secondary | ICD-10-CM | POA: Diagnosis not present

## 2017-02-28 DIAGNOSIS — Z955 Presence of coronary angioplasty implant and graft: Secondary | ICD-10-CM | POA: Diagnosis not present

## 2017-03-02 ENCOUNTER — Encounter (HOSPITAL_COMMUNITY)
Admission: RE | Admit: 2017-03-02 | Discharge: 2017-03-02 | Disposition: A | Discharge status: Home / Self Care | Payer: Federal, State, Local not specified - PPO | Source: Ambulatory Visit | Attending: Cardiology | Admitting: Cardiology

## 2017-03-02 DIAGNOSIS — Z955 Presence of coronary angioplasty implant and graft: Secondary | ICD-10-CM

## 2017-03-02 DIAGNOSIS — I214 Non-ST elevation (NSTEMI) myocardial infarction: Secondary | ICD-10-CM

## 2017-03-05 ENCOUNTER — Encounter (HOSPITAL_COMMUNITY)
Admission: RE | Admit: 2017-03-05 | Discharge: 2017-03-05 | Disposition: A | Discharge status: Home / Self Care | Payer: Federal, State, Local not specified - PPO | Source: Ambulatory Visit | Attending: Cardiology | Admitting: Cardiology

## 2017-03-05 DIAGNOSIS — Z955 Presence of coronary angioplasty implant and graft: Secondary | ICD-10-CM | POA: Diagnosis not present

## 2017-03-05 DIAGNOSIS — I214 Non-ST elevation (NSTEMI) myocardial infarction: Secondary | ICD-10-CM | POA: Diagnosis not present

## 2017-03-07 ENCOUNTER — Encounter (HOSPITAL_COMMUNITY)
Admission: RE | Admit: 2017-03-07 | Discharge: 2017-03-07 | Disposition: A | Discharge status: Home / Self Care | Payer: Federal, State, Local not specified - PPO | Source: Ambulatory Visit | Attending: Cardiology | Admitting: Cardiology

## 2017-03-07 DIAGNOSIS — I214 Non-ST elevation (NSTEMI) myocardial infarction: Secondary | ICD-10-CM

## 2017-03-07 DIAGNOSIS — Z955 Presence of coronary angioplasty implant and graft: Secondary | ICD-10-CM | POA: Diagnosis not present

## 2017-03-09 ENCOUNTER — Encounter (HOSPITAL_COMMUNITY)
Admission: RE | Admit: 2017-03-09 | Discharge: 2017-03-09 | Disposition: A | Discharge status: Home / Self Care | Payer: Federal, State, Local not specified - PPO | Source: Ambulatory Visit | Attending: Cardiology | Admitting: Cardiology

## 2017-03-09 DIAGNOSIS — I214 Non-ST elevation (NSTEMI) myocardial infarction: Secondary | ICD-10-CM

## 2017-03-09 DIAGNOSIS — Z955 Presence of coronary angioplasty implant and graft: Secondary | ICD-10-CM

## 2017-03-12 ENCOUNTER — Encounter (HOSPITAL_COMMUNITY)
Admission: RE | Admit: 2017-03-12 | Discharge: 2017-03-12 | Disposition: A | Discharge status: Home / Self Care | Payer: Federal, State, Local not specified - PPO | Source: Ambulatory Visit | Attending: Cardiology | Admitting: Cardiology

## 2017-03-12 DIAGNOSIS — I214 Non-ST elevation (NSTEMI) myocardial infarction: Secondary | ICD-10-CM | POA: Diagnosis not present

## 2017-03-12 DIAGNOSIS — Z955 Presence of coronary angioplasty implant and graft: Secondary | ICD-10-CM | POA: Diagnosis not present

## 2017-03-12 LAB — GLUCOSE, CAPILLARY: GLUCOSE-CAPILLARY: 134 mg/dL — AB (ref 65–99)

## 2017-03-13 ENCOUNTER — Encounter (HOSPITAL_COMMUNITY): Payer: Self-pay

## 2017-03-13 NOTE — Progress Notes (Signed)
Cardiac Individual Treatment Plan  Patient Details  Name: Connor Reed MRN: [id number] Date of Birth: 24-Apr-1961 Referring Provider:     CARDIAC REHAB PHASE II ORIENTATION from 01/16/2017 in [facility] CARDIAC REHAB  Referring Provider  Donato Schultz, MD.      Initial Encounter Date:    CARDIAC REHAB PHASE II ORIENTATION from 01/16/2017 in [facility] CARDIAC REHAB  Date  01/16/17  Referring Provider  Donato Schultz, MD.      Visit Diagnosis: NSTEMI (non-ST elevated myocardial infarction) [facility])  Stented coronary artery  Patient's Home Medications on Admission:  Current Outpatient Prescriptions:  .  aspirin 81 MG chewable tablet, Chew 1 tablet (81 mg total) by mouth daily., Disp: 30 tablet, Rfl: 5 .  atorvastatin (LIPITOR) 40 MG tablet, Take 1 tablet (40 mg total) by mouth daily at 6 PM., Disp: 30 tablet, Rfl: 6 .  clopidogrel (PLAVIX) 75 MG tablet, Take 1 tablet (75 mg total) by mouth daily with breakfast., Disp: 30 tablet, Rfl: 11 .  Fluticasone-Salmeterol (ADVAIR DISKUS) 100-50 MCG/DOSE AEPB, Inhale 1 puff into the lungs 2 (two) times daily., Disp: 60 each, Rfl: 3 .  glucose blood (ONETOUCH VERIO) test strip, Test blood sugar two times a day. Diagnosis code E11.00, non-insulin dependent diabetes, Disp: 100 each, Rfl: 3 .  lisinopril (PRINIVIL,ZESTRIL) 5 MG tablet, Take 1 tablet (5 mg total) by mouth daily., Disp: 30 tablet, Rfl: 11 .  metFORMIN (GLUCOPHAGE) 500 MG tablet, Take 1 500 mg tablet twice a day., Disp: 60 tablet, Rfl: 5 .  nitroGLYCERIN (NITROSTAT) 0.4 MG SL tablet, Place 1 tablet (0.4 mg total) under the tongue every 5 (five) minutes as needed for chest pain., Disp: 25 tablet, Rfl: 3 .  ONETOUCH DELICA LANCETS FINE MISC, Use to check blood sugar two times daily. Diagnosis code:E11.00, Disp: 100 each, Rfl: 3 .  PROAIR HFA 108 (90 Base) MCG/ACT inhaler, Inhale 2 puffs into the lungs 4 (four) times daily as needed for wheezing., Disp: 18 g,  Rfl: 3  Past Medical History: Past Medical History:  Diagnosis Date  . Asthma   . Chronic bronchitis (HCC)   . Hypertension     Tobacco Use: History  Smoking Status  . Former Smoker  . Packs/day: 1.00  . Years: 20.00  . Quit date: 06/19/1990  Smokeless Tobacco  . Never Used    Labs: Recent Review Flowsheet Data    Labs for ITP Cardiac and Pulmonary Rehab Latest Ref Rng & Units 12/17/2016 12/18/2016 02/23/2017   Cholestrol 100 - 199 mg/dL - 409 811   LDLCALC 0 - 99 mg/dL - 82 71   HDL >91 mg/dL - 47(W) 29(F)   Trlycerides 0 - 149 mg/dL - 621(H) 086   Hemoglobin A1c 4.8 - 5.6 % 10.3(H) - -      Capillary Blood Glucose: Lab Results  Component Value Date   GLUCAP 134 (H) 03/12/2017   GLUCAP 119 (H) 02/12/2017   GLUCAP 112 (H) 01/26/2017   GLUCAP 168 (H) 01/24/2017   GLUCAP 128 (H) 01/24/2017     Exercise Target Goals:    Exercise Program Goal: Individual exercise prescription set with THRR, safety & activity barriers. Participant demonstrates ability to understand and report RPE using BORG scale, to self-measure pulse accurately, and to acknowledge the importance of the exercise prescription.  Exercise Prescription Goal: Starting with aerobic activity 30 plus minutes a day, 3 days per week for initial exercise prescription. Provide home exercise prescription and guidelines that  participant acknowledges understanding prior to discharge.  Activity Barriers & Risk Stratification:     Activity Barriers & Cardiac Risk Stratification - 01/16/17 1135      Activity Barriers & Cardiac Risk Stratification   Activity Barriers Arthritis;Other (comment)   Comments Left hip pain   Cardiac Risk Stratification High      6 Minute Walk:     6 Minute Walk    Row Name 01/16/17 1134         6 Minute Walk   Phase Initial     Distance 1636 feet     Walk Time 6 minutes     # of Rest Breaks 0     MPH 3.1     METS 4.04     RPE 11     VO2 Peak 14.13     Symptoms No      Resting HR 73 bpm     Resting BP 100/60     Max Ex. HR 120 bpm     Max Ex. BP 112/60     2 Minute Post BP 110/62        Oxygen Initial Assessment:   Oxygen Re-Evaluation:   Oxygen Discharge (Final Oxygen Re-Evaluation):   Initial Exercise Prescription:     Initial Exercise Prescription - 01/16/17 1100      Date of Initial Exercise RX and Referring Provider   Date 01/16/17   Referring Provider Donato Schultz, MD.     Treadmill   MPH 3.4   Grade 0   Minutes 10   METs 3.6     Bike   Level 1.7   Minutes 10   METs 4.13     NuStep   Level 4   SPM 95   Minutes 10   METs 3.3     Prescription Details   Frequency (times per week) 3   Duration Progress to 30 minutes of continuous aerobic without signs/symptoms of physical distress     Intensity   THRR 40-80% of Max Heartrate 65-131   Ratings of Perceived Exertion 11-13   Perceived Dyspnea 0-4     Progression   Progression Continue to progress workloads to maintain intensity without signs/symptoms of physical distress.     Resistance Training   Training Prescription Yes   Weight 4lbs   Reps 10-15      Perform Capillary Blood Glucose checks as needed.  Exercise Prescription Changes:     Exercise Prescription Changes    Row Name 01/24/17 1700 02/07/17 1800 02/09/17 0900 02/20/17 1600 03/05/17 1600     Response to Exercise   Blood Pressure (Admit) 124/76 122/74 120/80 112/76 138/84   Blood Pressure (Exercise) 170/84 140/68 146/82 160/72 140/70   Blood Pressure (Exit) 114/70 110/80 120/82 118/70 132/80   Heart Rate (Admit) 76 bpm 76 bpm 65 bpm 80 bpm 79 bpm   Heart Rate (Exercise) 120 bpm 113 bpm 128 bpm 127 bpm 143 bpm   Heart Rate (Exit) 84 bpm 74 bpm 75 bpm 77 bpm 79 bpm   Rating of Perceived Exertion (Exercise)  -  -  - 11 11   Symptoms none none none none none   Comments Off to a good start with exercise.  -  -  -  -   Duration Continue with 30 min of aerobic exercise without signs/symptoms of  physical distress. Continue with 30 min of aerobic exercise without signs/symptoms of physical distress. Continue with 30 min of aerobic exercise without signs/symptoms of physical distress.  Continue with 30 min of aerobic exercise without signs/symptoms of physical distress. Continue with 30 min of aerobic exercise without signs/symptoms of physical distress.   Intensity THRR unchanged THRR unchanged THRR unchanged THRR unchanged THRR unchanged     Progression   Progression Continue to progress workloads to maintain intensity without signs/symptoms of physical distress. Continue to progress workloads to maintain intensity without signs/symptoms of physical distress. Continue to progress workloads to maintain intensity without signs/symptoms of physical distress. Continue to progress workloads to maintain intensity without signs/symptoms of physical distress. Continue to progress workloads to maintain intensity without signs/symptoms of physical distress.   Average METs 3.6 3.6 3.6 4.3 4.4     Resistance Training   Training Prescription Yes Yes Yes Yes Yes   Weight 4lbs 5lbs 5lbs 8lbs 8lbs   Reps 10-15 10-15 10-15 10-15 10-15   Time  -  -  -  - 10 Minutes     Interval Training   Interval Training No No No No No     Treadmill   MPH 3.4 3.4 3.4 3.4 3.5   Grade 0 0 0 0 2   Minutes 10 10 10 10 10    METs 3.6 3.6 3.6 3.6 4.65     Bike   Level 1.7 1.7 1.7 1.7 1.7   Minutes 10 10 10 10 10    METs 4.1 4.11 4.1 4.12 4.11     NuStep   Level 4 4 5 5 6    SPM 95 95 95 95 95   Minutes 10 10 10 10 10    METs 3.2 2.8 3.2 5.1 4.5     Home Exercise Plan   Plans to continue exercise at  -  - Home (comment) Home (comment) Home (comment)   Frequency  -  - Add 3 additional days to program exercise sessions. Add 3 additional days to program exercise sessions. Add 3 additional days to program exercise sessions.   Initial Home Exercises Provided  -  - 02/09/17 02/09/17 02/09/17      Exercise Comments:      Exercise Comments    Row Name 01/24/17 1729 02/07/17 1810 02/09/17 0920 02/21/17 0904 03/07/17 0700   Exercise Comments Off to a good start with exercise. Reviewed METs and goals. Reviewed home exercise guidelines. Pt has a treadmill at home that he is using 45-60 minutes, 2-3 days/week. Reviewed METs with patient. Reviewed METs and goals with patient.      Exercise Goals and Review:     Exercise Goals    Row Name 01/16/17 1135             Exercise Goals   Increase Physical Activity Yes       Intervention Provide advice, education, support and counseling about physical activity/exercise needs.;Develop an individualized exercise prescription for aerobic and resistive training based on initial evaluation findings, risk stratification, comorbidities and participant's personal goals.       Expected Outcomes Achievement of increased cardiorespiratory fitness and enhanced flexibility, muscular endurance and strength shown through measurements of functional capacity and personal statement of participant.       Increase Strength and Stamina Yes       Intervention Provide advice, education, support and counseling about physical activity/exercise needs.;Develop an individualized exercise prescription for aerobic and resistive training based on initial evaluation findings, risk stratification, comorbidities and participant's personal goals.       Expected Outcomes Achievement of increased cardiorespiratory fitness and enhanced flexibility, muscular endurance and strength shown through measurements of  functional capacity and personal statement of participant.          Exercise Goals Re-Evaluation :     Exercise Goals Re-Evaluation    Row Name 02/07/17 1809 03/07/17 0700           Exercise Goal Re-Evaluation   Exercise Goals Review Increase Physical Activity;Increase Strength and Stamina Increase Physical Activity;Knowledge and understanding of Target Heart Rate Range (THRR);Able to  understand and use rate of perceived exertion (RPE) scale      Comments Pt sstates that he is "feeling a whole lot better". Making good progress with exercise. Pt is also back to golfing. Patient is walking on treadmill at home daily, 20-60 minutes. Reviewed THRR. Pt is able to understand and use the RPE scale appropriately.      Expected Outcomes Continue exercise progression to see improvement in overall increase in stamina and help achieve weight loss goals. Continue daily exercise 30-60 minutes.          Discharge Exercise Prescription (Final Exercise Prescription Changes):     Exercise Prescription Changes - 03/05/17 1600      Response to Exercise   Blood Pressure (Admit) 138/84   Blood Pressure (Exercise) 140/70   Blood Pressure (Exit) 132/80   Heart Rate (Admit) 79 bpm   Heart Rate (Exercise) 143 bpm   Heart Rate (Exit) 79 bpm   Rating of Perceived Exertion (Exercise) 11   Symptoms none   Duration Continue with 30 min of aerobic exercise without signs/symptoms of physical distress.   Intensity THRR unchanged     Progression   Progression Continue to progress workloads to maintain intensity without signs/symptoms of physical distress.   Average METs 4.4     Resistance Training   Training Prescription Yes   Weight 8lbs   Reps 10-15   Time 10 Minutes     Interval Training   Interval Training No     Treadmill   MPH 3.5   Grade 2   Minutes 10   METs 4.65     Bike   Level 1.7   Minutes 10   METs 4.11     NuStep   Level 6   SPM 95   Minutes 10   METs 4.5     Home Exercise Plan   Plans to continue exercise at Home (comment)   Frequency Add 3 additional days to program exercise sessions.   Initial Home Exercises Provided 02/09/17      Nutrition:  Target Goals: Understanding of nutrition guidelines, daily intake of sodium 1500mg , cholesterol 200mg , calories 30% from fat and 7% or less from saturated fats, daily to have 5 or more servings of fruits and  vegetables.  Biometrics:     Pre Biometrics - 01/16/17 1137      Pre Biometrics   Height 5' 11.5" (1.816 m)   Weight 228 lb 2.8 oz (103.5 kg)   Waist Circumference 44.75 inches   Hip Circumference 41 inches   Waist to Hip Ratio 1.09 %   BMI (Calculated) 31.4   Triceps Skinfold 23 mm   % Body Fat 32.2 %   Grip Strength 49 kg   Flexibility 8 in   Single Leg Stand 30 seconds       Nutrition Therapy Plan and Nutrition Goals:     Nutrition Therapy & Goals - 01/16/17 1558      Nutrition Therapy   Diet Carb Modified, Therapeutic Lifestyle Changes     Personal Nutrition Goals   Nutrition  Goal Pt to identify and limit food sources of saturated fat, trans fat, and sodium   Personal Goal #2 Improved blood glucose control as evidenced by pt's A1c trending toward 7.0 or less.   Personal Goal #3 Pt to identify food quantities necessary to achieve weight loss of 6-24 lb (2.7-10.9 kg) at graduation from cardiac rehab. Goal wt of 200 lb desired.      Intervention Plan   Intervention Prescribe, educate and counsel regarding individualized specific dietary modifications aiming towards targeted core components such as weight, hypertension, lipid management, diabetes, heart failure and other comorbidities.   Expected Outcomes Short Term Goal: Understand basic principles of dietary content, such as calories, fat, sodium, cholesterol and nutrients.;Long Term Goal: Adherence to prescribed nutrition plan.      Nutrition Discharge: Nutrition Scores:     Nutrition Assessments - 01/16/17 1558      MEDFICTS Scores   Pre Score 52      Nutrition Goals Re-Evaluation:   Nutrition Goals Re-Evaluation:   Nutrition Goals Discharge (Final Nutrition Goals Re-Evaluation):   Psychosocial: Target Goals: Acknowledge presence or absence of significant depression and/or stress, maximize coping skills, provide positive support system. Participant is able to verbalize types and ability to use  techniques and skills needed for reducing stress and depression.  Initial Review & Psychosocial Screening:     Initial Psych Review & Screening - 01/16/17 1430      Initial Review   Current issues with None Identified     Family Dynamics   Good Support System? Yes     Barriers   Psychosocial barriers to participate in program The patient should benefit from training in stress management and relaxation.      Quality of Life Scores:     Quality of Life - 01/16/17 1145      Quality of Life Scores   Health/Function Pre 27.2 %   Socioeconomic Pre 28.79 %   Psych/Spiritual Pre 27.43 %   Family Pre 22.8 %   GLOBAL Pre 26.93 %      PHQ-9: Recent Review Flowsheet Data    Depression screen [facility] 2/9 02/12/2017 02/12/2017 01/24/2017 12/27/2016   Decreased Interest 0 0 0 0   Down, Depressed, Hopeless 0 0 0 0   PHQ - 2 Score 0 0 0 0     Interpretation of Total Score  Total Score Depression Severity:  1-4 = Minimal depression, 5-9 = Mild depression, 10-14 = Moderate depression, 15-19 = Moderately severe depression, 20-27 = Severe depression   Psychosocial Evaluation and Intervention:     Psychosocial Evaluation - 02/12/17 0812      Psychosocial Evaluation & Interventions   Interventions Encouraged to exercise with the program and follow exercise prescription;Stress management education   Comments no psychosocial needs identified, no interventions necessary    Expected Outcomes pt will exhibit positive outlook with good coping skills.    Continue Psychosocial Services  No Follow up required      Psychosocial Re-Evaluation:     Psychosocial Re-Evaluation    Row Name 02/12/17 0813 03/02/17 1117 03/06/17 1610         Psychosocial Re-Evaluation   Current issues with None Identified None Identified None Identified     Comments no psychosocial needs identified, no interventions necessary  no psychosocial needs identified, no interventions necessary  no psychosocial needs  identified, no interventions necessary      Expected Outcomes pt will exhibit positive outlook with good coping skills.  pt will exhibit positive  outlook with good coping skills.  pt will exhibit positive outlook with good coping skills.      Interventions Stress management education;Encouraged to attend Cardiac Rehabilitation for the exercise Stress management education;Encouraged to attend Cardiac Rehabilitation for the exercise Stress management education;Encouraged to attend Cardiac Rehabilitation for the exercise     Continue Psychosocial Services  No Follow up required No Follow up required No Follow up required        Psychosocial Discharge (Final Psychosocial Re-Evaluation):     Psychosocial Re-Evaluation - 03/06/17 1610      Psychosocial Re-Evaluation   Current issues with None Identified   Comments no psychosocial needs identified, no interventions necessary    Expected Outcomes pt will exhibit positive outlook with good coping skills.    Interventions Stress management education;Encouraged to attend Cardiac Rehabilitation for the exercise   Continue Psychosocial Services  No Follow up required      Vocational Rehabilitation: Provide vocational rehab assistance to qualifying candidates.   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 Participant will state understanding/return demonstration of topics presented.  Learning Barriers/Preferences:     Learning Barriers/Preferences - 01/16/17 1139      Learning Barriers/Preferences   Learning Barriers None   Learning Preferences Audio;Computer/Internet;Group Instruction;Individual  Instruction;Pictoral;Skilled Demonstration;Verbal Instruction;Video;Written Material      Education Topics: Count Your Pulse:  -Group instruction provided by verbal instruction, demonstration, patient participation and written materials to support subject.  Instructors address importance of being able to find your pulse and how to count your pulse when at home without a heart monitor.  Patients get hands on experience counting their pulse with staff help and individually.   CARDIAC REHAB PHASE II EXERCISE from 02/12/2017 in [facility] CARDIAC REHAB  Date  02/09/17  Instruction Review Code  2- meets goals/outcomes      Heart Attack, Angina, and Risk Factor Modification:  -Group instruction provided by verbal instruction, video, and written materials to support subject.  Instructors address signs and symptoms of angina and heart attacks.    Also discuss risk factors for heart disease and how to make changes to improve heart health risk factors.   Functional Fitness:  -Group instruction provided by verbal instruction, demonstration, patient participation, and written materials to support subject.  Instructors address safety measures for doing things around the house.  Discuss how to get up and down off the floor, how to pick things up properly, how to safely get out of a chair without assistance, and balance training.   Meditation and Mindfulness:  -Group instruction provided by verbal instruction, patient participation, and written materials to support subject.  Instructor addresses importance of mindfulness and meditation practice to help reduce stress and improve awareness.  Instructor also leads participants through a meditation exercise.    Stretching for Flexibility and Mobility:  -Group instruction provided by verbal instruction, patient participation, and written materials to support subject.  Instructors lead participants through series of stretches that are designed  to increase flexibility thus improving mobility.  These stretches are additional exercise for major muscle groups that are typically performed during regular warm up and cool down.   Hands  Only CPR:  -Group verbal, video, and participation provides a basic overview of AHA guidelines for community CPR. Role-play of emergencies allow participants the opportunity to practice calling for help and chest compression technique with discussion of AED use.   Hypertension: -Group verbal and written instruction that provides a basic overview of hypertension including the most recent diagnostic guidelines, risk factor reduction with self-care instructions and medication management.    Nutrition I class: Heart Healthy Eating:  -Group instruction provided by PowerPoint slides, verbal discussion, and written materials to support subject matter. The instructor gives an explanation and review of the Therapeutic Lifestyle Changes diet recommendations, which includes a discussion on lipid goals, dietary fat, sodium, fiber, plant stanol/sterol esters, sugar, and the components of a well-balanced, healthy diet.   Nutrition II class: Lifestyle Skills:  -Group instruction provided by PowerPoint slides, verbal discussion, and written materials to support subject matter. The instructor gives an explanation and review of label reading, grocery shopping for heart health, heart healthy recipe modifications, and ways to make healthier choices when eating out.   CARDIAC REHAB PHASE II EXERCISE from 02/12/2017 in [facility] CARDIAC REHAB  Date  02/13/17  Educator  RD  Instruction Review Code  2- meets goals/outcomes      Diabetes Question & Answer:  -Group instruction provided by PowerPoint slides, verbal discussion, and written materials to support subject matter. The instructor gives an explanation and review of diabetes co-morbidities, pre- and post-prandial blood glucose goals, pre-exercise blood  glucose goals, signs, symptoms, and treatment of hypoglycemia and hyperglycemia, and foot care basics.   Diabetes Blitz:  -Group instruction provided by PowerPoint slides, verbal discussion, and written materials to support subject matter. The instructor gives an explanation and review of the physiology behind type 1 and type 2 diabetes, diabetes medications and rational behind using different medications, pre- and post-prandial blood glucose recommendations and Hemoglobin A1c goals, diabetes diet, and exercise including blood glucose guidelines for exercising safely.    Portion Distortion:  -Group instruction provided by PowerPoint slides, verbal discussion, written materials, and food models to support subject matter. The instructor gives an explanation of serving size versus portion size, changes in portions sizes over the last 20 years, and what consists of a serving from each food group.   Stress Management:  -Group instruction provided by verbal instruction, video, and written materials to support subject matter.  Instructors review role of stress in heart disease and how to cope with stress positively.     Exercising on Your Own:  -Group instruction provided by verbal instruction, power point, and written materials to support subject.  Instructors discuss benefits of exercise, components of exercise, frequency and intensity of exercise, and end points for exercise.  Also discuss use of nitroglycerin and activating EMS.  Review options of places to exercise outside of rehab.  Review guidelines for sex with heart disease.   Cardiac Drugs I:  -Group instruction provided by verbal instruction and written materials to support subject.  Instructor reviews cardiac drug classes: antiplatelets, anticoagulants, beta blockers, and statins.  Instructor discusses reasons, side effects, and lifestyle considerations for each drug class.   Cardiac Drugs II:  -Group instruction provided by verbal  instruction and written materials to support subject.  Instructor reviews cardiac drug classes: angiotensin converting enzyme inhibitors (ACE-I), angiotensin II receptor blockers (ARBs), nitrates, and calcium channel blockers.  Instructor discusses reasons, side effects, and lifestyle considerations for each drug class.   Anatomy and Physiology of the Circulatory System:  Group verbal and written instruction and models provide basic cardiac anatomy and physiology, with the coronary electrical and arterial systems. Review of: AMI, Angina, Valve disease, Heart Failure, Peripheral Artery Disease, Cardiac Arrhythmia, Pacemakers, and the ICD.   Other Education:  -Group or individual verbal, written, or video instructions that support the educational goals of the cardiac rehab program.   Knowledge Questionnaire Score:   Core Components/Risk Factors/Patient Goals at Admission:     Personal Goals and Risk Factors at Admission - 01/16/17 1232      Core Components/Risk Factors/Patient Goals on Admission    Weight Management Yes;Obesity   Intervention Weight Management/Obesity: Establish reasonable short term and long term weight goals.;Obesity: Provide education and appropriate resources to help participant work on and attain dietary goals.;Weight Management: Provide education and appropriate resources to help participant work on and attain dietary goals.;Weight Management: Develop a combined nutrition and exercise program designed to reach desired caloric intake, while maintaining appropriate intake of nutrient and fiber, sodium and fats, and appropriate energy expenditure required for the weight goal.   Admit Weight 228 lb 2.8 oz (103.5 kg)   Goal Weight: Short Term 222 lb 3.2 oz (100.8 kg)   Goal Weight: Long Term 203 lb 3.2 oz (92.2 kg)   Expected Outcomes Short Term: Continue to assess and modify interventions until short term weight is achieved;Long Term: Adherence to nutrition and physical  activity/exercise program aimed toward attainment of established weight goal;Weight Loss: Understanding of general recommendations for a balanced deficit meal plan, which promotes 1-2 lb weight loss per week and includes a negative energy balance of [phone] kcal/d   Diabetes Yes   Intervention Provide education about signs/symptoms and action to take for hypo/hyperglycemia.;Provide education about proper nutrition, including hydration, and aerobic/resistive exercise prescription along with prescribed medications to achieve blood glucose in normal ranges: Fasting glucose 65-99 mg/dL   Expected Outcomes Short Term: Participant verbalizes understanding of the signs/symptoms and immediate care of hyper/hypoglycemia, proper foot care and importance of medication, aerobic/resistive exercise and nutrition plan for blood glucose control.;Long Term: Attainment of HbA1C < 7%.   Hypertension Yes   Intervention Provide education on lifestyle modifcations including regular physical activity/exercise, weight management, moderate sodium restriction and increased consumption of fresh fruit, vegetables, and low fat dairy, alcohol moderation, and smoking cessation.;Monitor prescription use compliance.   Expected Outcomes Short Term: Continued assessment and intervention until BP is < 140/67mm HG in hypertensive participants. < 130/2mm HG in hypertensive participants with diabetes, heart failure or chronic kidney disease.;Long Term: Maintenance of blood pressure at goal levels.   Lipids Yes   Intervention Provide education and support for participant on nutrition & aerobic/resistive exercise along with prescribed medications to achieve LDL 70mg , HDL >40mg .   Expected Outcomes Short Term: Participant states understanding of desired cholesterol values and is compliant with medications prescribed. Participant is following exercise prescription and nutrition guidelines.;Long Term: Cholesterol controlled with medications as  prescribed, with individualized exercise RX and with personalized nutrition plan. Value goals: LDL < , HDL > 40 mg.   Personal Goal Other Yes   Personal Goal Get back to normal self.   Intervention Provide exercise routine to help improve energy, strength, and stamina, so that patient can return to previous activities.   Expected Outcomes Patient will resume normal activites and "get back to normal self" as eveidenced by patient personal statement.      Core Components/Risk Factors/Patient Goals Review:      Goals and Risk Factor Review  Row Name 02/12/17 1914 03/02/17 1116 03/06/17 0950         Core Components/Risk Factors/Patient Goals Review   Personal Goals Review Weight Management/Obesity;Diabetes;Hypertension;Lipids;Other Weight Management/Obesity Weight Management/Obesity     Review pt with muliple CAD RF demonstrates eagerness to participate in CR activities. pt with muliple CAD RF demonstrates eagerness to participate in CR activities. pt with muliple CAD RF demonstrates eagerness to participate in CR activities. pt using Treadmill for home exercise and is looking forward to resuming resistence training.       Expected Outcomes pt will participate in CR exercise, nutrition and lifestyle modification education to decrease overall RF.  pt will participate in CR exercise, nutrition and lifestyle modification education to decrease overall RF.  pt will participate in CR exercise, nutrition and lifestyle modification education to decrease overall RF.         Core Components/Risk Factors/Patient Goals at Discharge (Final Review):      Goals and Risk Factor Review - 03/06/17 0950      Core Components/Risk Factors/Patient Goals Review   Personal Goals Review Weight Management/Obesity   Review pt with muliple CAD RF demonstrates eagerness to participate in CR activities. pt using Treadmill for home exercise and is looking forward to resuming resistence training.     Expected  Outcomes pt will participate in CR exercise, nutrition and lifestyle modification education to decrease overall RF.       ITP Comments:     ITP Comments    Row Name 01/16/17 0950 01/17/17 1642 02/14/17 0821 03/13/17 1520     ITP Comments Dr. Armanda Magic, Medical Director  Dr. Armanda Magic, Medical Director  Dr. Armanda Magic, Medical Director  30 day ITP review.  Pt with good participation and attendance.  No change in current regimen unless directed by Medical Director.         Comments:

## 2017-03-14 ENCOUNTER — Encounter (HOSPITAL_COMMUNITY): Payer: Federal, State, Local not specified - PPO

## 2017-03-14 ENCOUNTER — Ambulatory Visit: Payer: Federal, State, Local not specified - PPO | Admitting: Registered"

## 2017-03-16 ENCOUNTER — Encounter (HOSPITAL_COMMUNITY): Payer: Federal, State, Local not specified - PPO

## 2017-03-19 ENCOUNTER — Encounter (HOSPITAL_COMMUNITY)
Admission: RE | Admit: 2017-03-19 | Discharge: 2017-03-19 | Disposition: A | Discharge status: Home / Self Care | Payer: Federal, State, Local not specified - PPO | Source: Ambulatory Visit | Attending: Cardiology | Admitting: Cardiology

## 2017-03-19 DIAGNOSIS — I214 Non-ST elevation (NSTEMI) myocardial infarction: Secondary | ICD-10-CM | POA: Diagnosis not present

## 2017-03-19 DIAGNOSIS — Z955 Presence of coronary angioplasty implant and graft: Secondary | ICD-10-CM | POA: Diagnosis not present

## 2017-03-21 ENCOUNTER — Encounter (HOSPITAL_COMMUNITY): Payer: Federal, State, Local not specified - PPO

## 2017-03-23 ENCOUNTER — Encounter (HOSPITAL_COMMUNITY)
Admission: RE | Admit: 2017-03-23 | Discharge: 2017-03-23 | Disposition: A | Discharge status: Home / Self Care | Payer: Federal, State, Local not specified - PPO | Source: Ambulatory Visit | Attending: Cardiology | Admitting: Cardiology

## 2017-03-23 DIAGNOSIS — I214 Non-ST elevation (NSTEMI) myocardial infarction: Secondary | ICD-10-CM

## 2017-03-23 DIAGNOSIS — Z955 Presence of coronary angioplasty implant and graft: Secondary | ICD-10-CM

## 2017-03-26 ENCOUNTER — Encounter (HOSPITAL_COMMUNITY)
Admission: RE | Admit: 2017-03-26 | Discharge: 2017-03-26 | Disposition: A | Discharge status: Home / Self Care | Payer: Federal, State, Local not specified - PPO | Source: Ambulatory Visit | Attending: Cardiology | Admitting: Cardiology

## 2017-03-26 DIAGNOSIS — I214 Non-ST elevation (NSTEMI) myocardial infarction: Secondary | ICD-10-CM | POA: Diagnosis not present

## 2017-03-26 DIAGNOSIS — Z955 Presence of coronary angioplasty implant and graft: Secondary | ICD-10-CM

## 2017-03-28 ENCOUNTER — Encounter (HOSPITAL_COMMUNITY): Payer: Federal, State, Local not specified - PPO

## 2017-03-30 ENCOUNTER — Encounter (HOSPITAL_COMMUNITY)
Admission: RE | Admit: 2017-03-30 | Discharge: 2017-03-30 | Disposition: A | Discharge status: Home / Self Care | Payer: Federal, State, Local not specified - PPO | Source: Ambulatory Visit | Attending: Cardiology | Admitting: Cardiology

## 2017-03-30 DIAGNOSIS — Z955 Presence of coronary angioplasty implant and graft: Secondary | ICD-10-CM | POA: Diagnosis not present

## 2017-03-30 DIAGNOSIS — I214 Non-ST elevation (NSTEMI) myocardial infarction: Secondary | ICD-10-CM

## 2017-04-02 ENCOUNTER — Encounter (HOSPITAL_COMMUNITY)
Admission: RE | Admit: 2017-04-02 | Discharge: 2017-04-02 | Disposition: A | Discharge status: Home / Self Care | Payer: Federal, State, Local not specified - PPO | Source: Ambulatory Visit | Attending: Cardiology | Admitting: Cardiology

## 2017-04-02 DIAGNOSIS — I214 Non-ST elevation (NSTEMI) myocardial infarction: Secondary | ICD-10-CM | POA: Diagnosis not present

## 2017-04-02 DIAGNOSIS — Z955 Presence of coronary angioplasty implant and graft: Secondary | ICD-10-CM

## 2017-04-04 ENCOUNTER — Encounter (HOSPITAL_COMMUNITY)
Admission: RE | Admit: 2017-04-04 | Discharge: 2017-04-04 | Disposition: A | Discharge status: Home / Self Care | Payer: Federal, State, Local not specified - PPO | Source: Ambulatory Visit | Attending: Cardiology | Admitting: Cardiology

## 2017-04-04 DIAGNOSIS — Z955 Presence of coronary angioplasty implant and graft: Secondary | ICD-10-CM

## 2017-04-04 DIAGNOSIS — I214 Non-ST elevation (NSTEMI) myocardial infarction: Secondary | ICD-10-CM | POA: Diagnosis not present

## 2017-04-06 ENCOUNTER — Encounter (HOSPITAL_COMMUNITY): Payer: Federal, State, Local not specified - PPO

## 2017-04-06 NOTE — Progress Notes (Signed)
Cardiac Individual Treatment Plan  Patient Details  Name: Connor Reed MRN: [id number] Date of Birth: [birth year] Referring Provider:     CARDIAC REHAB PHASE II ORIENTATION from 01/16/2017 in [facility] CARDIAC REHAB  Referring Provider  Donato Schultz, MD.      Initial Encounter Date:    CARDIAC REHAB PHASE II ORIENTATION from 01/16/2017 in [facility] CARDIAC REHAB  Date  01/16/17  Referring Provider  Donato Schultz, MD.      Visit Diagnosis: NSTEMI (non-ST elevated myocardial infarction) [facility])  Stented coronary artery  Patient's Home Medications on Admission:  Current Outpatient Prescriptions:  .  aspirin 81 MG chewable tablet, Chew 1 tablet (81 mg total) by mouth daily., Disp: 30 tablet, Rfl: 5 .  atorvastatin (LIPITOR) 40 MG tablet, Take 1 tablet (40 mg total) by mouth daily at 6 PM., Disp: 30 tablet, Rfl: 6 .  clopidogrel (PLAVIX) 75 MG tablet, Take 1 tablet (75 mg total) by mouth daily with breakfast., Disp: 30 tablet, Rfl: 11 .  Fluticasone-Salmeterol (ADVAIR DISKUS) 100-50 MCG/DOSE AEPB, Inhale 1 puff into the lungs 2 (two) times daily., Disp: 60 each, Rfl: 3 .  glucose blood (ONETOUCH VERIO) test strip, Test blood sugar two times a day. Diagnosis code E11.00, non-insulin dependent diabetes, Disp: 100 each, Rfl: 3 .  lisinopril (PRINIVIL,ZESTRIL) 5 MG tablet, Take 1 tablet (5 mg total) by mouth daily., Disp: 30 tablet, Rfl: 11 .  metFORMIN (GLUCOPHAGE) 500 MG tablet, Take 1 500 mg tablet twice a day., Disp: 60 tablet, Rfl: 5 .  nitroGLYCERIN (NITROSTAT) 0.4 MG SL tablet, Place 1 tablet (0.4 mg total) under the tongue every 5 (five) minutes as needed for chest pain., Disp: 25 tablet, Rfl: 3 .  ONETOUCH DELICA LANCETS FINE MISC, Use to check blood sugar two times daily. Diagnosis code:E11.00, Disp: 100 each, Rfl: 3 .  PROAIR HFA 108 (90 Base) MCG/ACT inhaler, Inhale 2 puffs into the lungs 4 (four) times daily as needed for wheezing., Disp: 18 g,  Rfl: 3  Past Medical History: Past Medical History:  Diagnosis Date  . Asthma   . Chronic bronchitis (HCC)   . Hypertension     Tobacco Use: History  Smoking Status  . Former Smoker  . Packs/day: 1.00  . Years: 20.00  . Quit date: 06/19/1990  Smokeless Tobacco  . Never Used    Labs: Recent Review Flowsheet Data    Labs for ITP Cardiac and Pulmonary Rehab Latest Ref Rng & Units 12/17/2016 12/18/2016 02/23/2017   Cholestrol 100 - 199 mg/dL - 865 784   LDLCALC 0 - 99 mg/dL - 82 71   HDL >69 mg/dL - 62(X) 52(W)   Trlycerides 0 - 149 mg/dL - 413(K) 440   Hemoglobin A1c 4.8 - 5.6 % 10.3(H) - -      Capillary Blood Glucose: Lab Results  Component Value Date   GLUCAP 134 (H) 03/12/2017   GLUCAP 119 (H) 02/12/2017   GLUCAP 112 (H) 01/26/2017   GLUCAP 168 (H) 01/24/2017   GLUCAP 128 (H) 01/24/2017     Exercise Target Goals:    Exercise Program Goal: Individual exercise prescription set with THRR, safety & activity barriers. Participant demonstrates ability to understand and report RPE using BORG scale, to self-measure pulse accurately, and to acknowledge the importance of the exercise prescription.  Exercise Prescription Goal: Starting with aerobic activity 30 plus minutes a day, 3 days per week for initial exercise prescription. Provide home exercise prescription and guidelines that  participant acknowledges understanding prior to discharge.  Activity Barriers & Risk Stratification:     Activity Barriers & Cardiac Risk Stratification - 01/16/17 1135      Activity Barriers & Cardiac Risk Stratification   Activity Barriers Arthritis;Other (comment)   Comments Left hip pain   Cardiac Risk Stratification High      6 Minute Walk:     6 Minute Walk    Row Name 01/16/17 1134         6 Minute Walk   Phase Initial     Distance 1636 feet     Walk Time 6 minutes     # of Rest Breaks 0     MPH 3.1     METS 4.04     RPE 11     VO2 Peak 14.13     Symptoms No      Resting HR 73 bpm     Resting BP 100/60     Max Ex. HR 120 bpm     Max Ex. BP 112/60     2 Minute Post BP 110/62        Oxygen Initial Assessment:   Oxygen Re-Evaluation:   Oxygen Discharge (Final Oxygen Re-Evaluation):   Initial Exercise Prescription:     Initial Exercise Prescription - 01/16/17 1100      Date of Initial Exercise RX and Referring Provider   Date 01/16/17   Referring Provider Donato Schultz, MD.     Treadmill   MPH 3.4   Grade 0   Minutes 10   METs 3.6     Bike   Level 1.7   Minutes 10   METs 4.13     NuStep   Level 4   SPM 95   Minutes 10   METs 3.3     Prescription Details   Frequency (times per week) 3   Duration Progress to 30 minutes of continuous aerobic without signs/symptoms of physical distress     Intensity   THRR 40-80% of Max Heartrate 65-131   Ratings of Perceived Exertion 11-13   Perceived Dyspnea 0-4     Progression   Progression Continue to progress workloads to maintain intensity without signs/symptoms of physical distress.     Resistance Training   Training Prescription Yes   Weight 4lbs   Reps 10-15      Perform Capillary Blood Glucose checks as needed.  Exercise Prescription Changes:     Exercise Prescription Changes    Row Name 01/24/17 1700 02/07/17 1800 02/09/17 0900 02/20/17 1600 03/05/17 1600     Response to Exercise   Blood Pressure (Admit) 124/76 122/74 120/80 112/76 138/84   Blood Pressure (Exercise) 170/84 140/68 146/82 160/72 140/70   Blood Pressure (Exit) 114/70 110/80 120/82 118/70 132/80   Heart Rate (Admit) 76 bpm 76 bpm 65 bpm 80 bpm 79 bpm   Heart Rate (Exercise) 120 bpm 113 bpm 128 bpm 127 bpm 143 bpm   Heart Rate (Exit) 84 bpm 74 bpm 75 bpm 77 bpm 79 bpm   Rating of Perceived Exertion (Exercise)  -  -  - 11 11   Symptoms none none none none none   Comments Off to a good start with exercise.  -  -  -  -   Duration Continue with 30 min of aerobic exercise without signs/symptoms of  physical distress. Continue with 30 min of aerobic exercise without signs/symptoms of physical distress. Continue with 30 min of aerobic exercise without signs/symptoms of physical distress.  Continue with 30 min of aerobic exercise without signs/symptoms of physical distress. Continue with 30 min of aerobic exercise without signs/symptoms of physical distress.   Intensity THRR unchanged THRR unchanged THRR unchanged THRR unchanged THRR unchanged     Progression   Progression Continue to progress workloads to maintain intensity without signs/symptoms of physical distress. Continue to progress workloads to maintain intensity without signs/symptoms of physical distress. Continue to progress workloads to maintain intensity without signs/symptoms of physical distress. Continue to progress workloads to maintain intensity without signs/symptoms of physical distress. Continue to progress workloads to maintain intensity without signs/symptoms of physical distress.   Average METs 3.6 3.6 3.6 4.3 4.4     Resistance Training   Training Prescription Yes Yes Yes Yes Yes   Weight 4lbs 5lbs 5lbs 8lbs 8lbs   Reps 10-15 10-15 10-15 10-15 10-15   Time  -  -  -  - 10 Minutes     Interval Training   Interval Training No No No No No     Treadmill   MPH 3.4 3.4 3.4 3.4 3.5   Grade 0 0 0 0 2   Minutes 10 10 10 10 10    METs 3.6 3.6 3.6 3.6 4.65     Bike   Level 1.7 1.7 1.7 1.7 1.7   Minutes 10 10 10 10 10    METs 4.1 4.11 4.1 4.12 4.11     NuStep   Level 4 4 5 5 6    SPM 95 95 95 95 95   Minutes 10 10 10 10 10    METs 3.2 2.8 3.2 5.1 4.5     Home Exercise Plan   Plans to continue exercise at  -  - Home (comment) Home (comment) Home (comment)   Frequency  -  - Add 3 additional days to program exercise sessions. Add 3 additional days to program exercise sessions. Add 3 additional days to program exercise sessions.   Initial Home Exercises Provided  -  - 02/09/17 02/09/17 02/09/17   Row Name 03/19/17 0800  04/02/17 0900           Response to Exercise   Blood Pressure (Admit) 122/82 132/82      Blood Pressure (Exercise) 144/78 158/78      Blood Pressure (Exit) 110/70 112/78      Heart Rate (Admit) 79 bpm 68 bpm      Heart Rate (Exercise) 138 bpm 140 bpm      Heart Rate (Exit) 75 bpm 76 bpm      Rating of Perceived Exertion (Exercise) 11 11      Symptoms none none      Duration Continue with 30 min of aerobic exercise without signs/symptoms of physical distress. Continue with 30 min of aerobic exercise without signs/symptoms of physical distress.      Intensity THRR unchanged THRR unchanged        Progression   Progression Continue to progress workloads to maintain intensity without signs/symptoms of physical distress. Continue to progress workloads to maintain intensity without signs/symptoms of physical distress.      Average METs 4.9 5.1        Resistance Training   Training Prescription Yes Yes      Weight 8lbs 8lbs      Reps 10-15 10-15      Time 10 Minutes 10 Minutes        Interval Training   Interval Training No No        Treadmill   MPH  3.7 3.8      Grade 3 4      Minutes 10 10      METs 5.36 6        Bike   Level 2 2      Minutes 10 10      METs 4.68 4.71        NuStep   Level 6 6      SPM 95 95      Minutes 10 10      METs 4.7 4.6        Home Exercise Plan   Plans to continue exercise at Home (comment) Home (comment)      Frequency Add 3 additional days to program exercise sessions. Add 3 additional days to program exercise sessions.      Initial Home Exercises Provided 02/09/17 02/09/17         Exercise Comments:     Exercise Comments    Row Name 01/24/17 1729 02/07/17 1810 02/09/17 0920 02/21/17 0904 03/07/17 0700   Exercise Comments Off to a good start with exercise. Reviewed METs and goals. Reviewed home exercise guidelines. Pt has a treadmill at home that he is using 45-60 minutes, 2-3 days/week. Reviewed METs with patient. Reviewed METs and  goals with patient.   Row Name 03/19/17 [id number] 04/02/17 9604         Exercise Comments Reviewed METs with patient. Reviewed goals with patient.         Exercise Goals and Review:     Exercise Goals    Row Name 01/16/17 1135             Exercise Goals   Increase Physical Activity Yes       Intervention Provide advice, education, support and counseling about physical activity/exercise needs.;Develop an individualized exercise prescription for aerobic and resistive training based on initial evaluation findings, risk stratification, comorbidities and participant's personal goals.       Expected Outcomes Achievement of increased cardiorespiratory fitness and enhanced flexibility, muscular endurance and strength shown through measurements of functional capacity and personal statement of participant.       Increase Strength and Stamina Yes       Intervention Provide advice, education, support and counseling about physical activity/exercise needs.;Develop an individualized exercise prescription for aerobic and resistive training based on initial evaluation findings, risk stratification, comorbidities and participant's personal goals.       Expected Outcomes Achievement of increased cardiorespiratory fitness and enhanced flexibility, muscular endurance and strength shown through measurements of functional capacity and personal statement of participant.          Exercise Goals Re-Evaluation :     Exercise Goals Re-Evaluation    Row Name 02/07/17 1809 03/07/17 0700 04/02/17 5409         Exercise Goal Re-Evaluation   Exercise Goals Review Increase Physical Activity;Increase Strength and Stamina Increase Physical Activity;Knowledge and understanding of Target Heart Rate Range (THRR);Able to understand and use rate of perceived exertion (RPE) scale Increase Physical Activity     Comments Pt sstates that he is "feeling a whole lot better". Making good progress with exercise. Pt is also back to  golfing. Patient is walking on treadmill at home daily, 20-60 minutes. Reviewed THRR. Pt is able to understand and use the RPE scale appropriately. Patient has home gym and is exercising 5 days/week, 1-1.5 hours.     Expected Outcomes Continue exercise progression to see improvement in overall increase in stamina and help achieve weight  loss goals. Continue daily exercise 30-60 minutes. Continue daily exercise 30-60 minutes to achieve health and fitness goals.         Discharge Exercise Prescription (Final Exercise Prescription Changes):     Exercise Prescription Changes - 04/02/17 0900      Response to Exercise   Blood Pressure (Admit) 132/82   Blood Pressure (Exercise) 158/78   Blood Pressure (Exit) 112/78   Heart Rate (Admit) 68 bpm   Heart Rate (Exercise) 140 bpm   Heart Rate (Exit) 76 bpm   Rating of Perceived Exertion (Exercise) 11   Symptoms none   Duration Continue with 30 min of aerobic exercise without signs/symptoms of physical distress.   Intensity THRR unchanged     Progression   Progression Continue to progress workloads to maintain intensity without signs/symptoms of physical distress.   Average METs 5.1     Resistance Training   Training Prescription Yes   Weight 8lbs   Reps 10-15   Time 10 Minutes     Interval Training   Interval Training No     Treadmill   MPH 3.8   Grade 4   Minutes 10   METs 6     Bike   Level 2   Minutes 10   METs 4.71     NuStep   Level 6   SPM 95   Minutes 10   METs 4.6     Home Exercise Plan   Plans to continue exercise at Home (comment)   Frequency Add 3 additional days to program exercise sessions.   Initial Home Exercises Provided 02/09/17      Nutrition:  Target Goals: Understanding of nutrition guidelines, daily intake of sodium 1500mg , cholesterol 200mg , calories 30% from fat and 7% or less from saturated fats, daily to have 5 or more servings of fruits and vegetables.  Biometrics:     Pre Biometrics -  01/16/17 1137      Pre Biometrics   Height 5' 11.5" (1.816 m)   Weight 228 lb 2.8 oz (103.5 kg)   Waist Circumference 44.75 inches   Hip Circumference 41 inches   Waist to Hip Ratio 1.09 %   BMI (Calculated) 31.4   Triceps Skinfold 23 mm   % Body Fat 32.2 %   Grip Strength 49 kg   Flexibility 8 in   Single Leg Stand 30 seconds       Nutrition Therapy Plan and Nutrition Goals:     Nutrition Therapy & Goals - 01/16/17 1558      Nutrition Therapy   Diet Carb Modified, Therapeutic Lifestyle Changes     Personal Nutrition Goals   Nutrition Goal Pt to identify and limit food sources of saturated fat, trans fat, and sodium   Personal Goal #2 Improved blood glucose control as evidenced by pt's A1c trending toward 7.0 or less.   Personal Goal #3 Pt to identify food quantities necessary to achieve weight loss of 6-24 lb (2.7-10.9 kg) at graduation from cardiac rehab. Goal wt of 200 lb desired.      Intervention Plan   Intervention Prescribe, educate and counsel regarding individualized specific dietary modifications aiming towards targeted core components such as weight, hypertension, lipid management, diabetes, heart failure and other comorbidities.   Expected Outcomes Short Term Goal: Understand basic principles of dietary content, such as calories, fat, sodium, cholesterol and nutrients.;Long Term Goal: Adherence to prescribed nutrition plan.      Nutrition Discharge: Nutrition Scores:     Nutrition Assessments -  01/16/17 1558      MEDFICTS Scores   Pre Score 52      Nutrition Goals Re-Evaluation:   Nutrition Goals Re-Evaluation:   Nutrition Goals Discharge (Final Nutrition Goals Re-Evaluation):   Psychosocial: Target Goals: Acknowledge presence or absence of significant depression and/or stress, maximize coping skills, provide positive support system. Participant is able to verbalize types and ability to use techniques and skills needed for reducing stress and  depression.  Initial Review & Psychosocial Screening:     Initial Psych Review & Screening - 01/16/17 1430      Initial Review   Current issues with None Identified     Family Dynamics   Good Support System? Yes     Barriers   Psychosocial barriers to participate in program The patient should benefit from training in stress management and relaxation.      Quality of Life Scores:     Quality of Life - 01/16/17 1145      Quality of Life Scores   Health/Function Pre 27.2 %   Socioeconomic Pre 28.79 %   Psych/Spiritual Pre 27.43 %   Family Pre 22.8 %   GLOBAL Pre 26.93 %      PHQ-9: Recent Review Flowsheet Data    [facility] 02/12/2017 02/12/2017 01/24/2017 12/27/2016   Decreased Interest 0 0 0 0   Down, Depressed, Hopeless 0 0 0 0   PHQ - 2 Score 0 0 0 0     Interpretation of Total Score  Total Score Depression Severity:  1-4 = Minimal depression, 5-9 = Mild depression, 10-14 = Moderate depression, 15-19 = Moderately severe depression, 20-27 = Severe depression   Psychosocial Evaluation and Intervention:     Psychosocial Evaluation - 02/12/17 0812      Psychosocial Evaluation & Interventions   Interventions Encouraged to exercise with the program and follow exercise prescription;Stress management education   Comments no psychosocial needs identified, no interventions necessary    Expected Outcomes pt will exhibit positive outlook with good coping skills.    Continue Psychosocial Services  No Follow up required      Psychosocial Re-Evaluation:     Psychosocial Re-Evaluation    Row Name 02/12/17 0813 03/02/17 1117 03/06/17 0952 04/03/17 0741       Psychosocial Re-Evaluation   Current issues with None Identified None Identified None Identified None Identified    Comments no psychosocial needs identified, no interventions necessary  no psychosocial needs identified, no interventions necessary  no psychosocial needs identified, no interventions  necessary  no psychosocial needs identified, no interventions necessary     Expected Outcomes pt will exhibit positive outlook with good coping skills.  pt will exhibit positive outlook with good coping skills.  pt will exhibit positive outlook with good coping skills.  pt will exhibit positive outlook with good coping skills.     Interventions Stress management education;Encouraged to attend Cardiac Rehabilitation for the exercise Stress management education;Encouraged to attend Cardiac Rehabilitation for the exercise Stress management education;Encouraged to attend Cardiac Rehabilitation for the exercise Stress management education;Encouraged to attend Cardiac Rehabilitation for the exercise    Continue Psychosocial Services  No Follow up required No Follow up required No Follow up required No Follow up required       Psychosocial Discharge (Final Psychosocial Re-Evaluation):     Psychosocial Re-Evaluation - 04/03/17 0741      Psychosocial Re-Evaluation   Current issues with None Identified   Comments no psychosocial needs identified, no interventions necessary  Expected Outcomes pt will exhibit positive outlook with good coping skills.    Interventions Stress management education;Encouraged to attend Cardiac Rehabilitation for the exercise   Continue Psychosocial Services  No Follow up required      Vocational Rehabilitation: Provide vocational rehab assistance to qualifying candidates.   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 Participant will state understanding/return demonstration of topics presented.  Learning Barriers/Preferences:     Learning  Barriers/Preferences - 01/16/17 1139      Learning Barriers/Preferences   Learning Barriers None   Learning Preferences Audio;Computer/Internet;Group Instruction;Individual Instruction;Pictoral;Skilled Demonstration;Verbal Instruction;Video;Written Material      Education Topics: Count Your Pulse:  -Group instruction provided by verbal instruction, demonstration, patient participation and written materials to support subject.  Instructors address importance of being able to find your pulse and how to count your pulse when at home without a heart monitor.  Patients get hands on experience counting their pulse with staff help and individually.   CARDIAC REHAB PHASE II EXERCISE from 02/12/2017 in [facility] CARDIAC REHAB  Date  02/09/17  Instruction Review Code  2- meets goals/outcomes      Heart Attack, Angina, and Risk Factor Modification:  -Group instruction provided by verbal instruction, video, and written materials to support subject.  Instructors address signs and symptoms of angina and heart attacks.    Also discuss risk factors for heart disease and how to make changes to improve heart health risk factors.   Functional Fitness:  -Group instruction provided by verbal instruction, demonstration, patient participation, and written materials to support subject.  Instructors address safety measures for doing things around the house.  Discuss how to get up and down off the floor, how to pick things up properly, how to safely get out of a chair without assistance, and balance training.   Meditation and Mindfulness:  -Group instruction provided by verbal instruction, patient participation, and written materials to support subject.  Instructor addresses importance of mindfulness and meditation practice to help reduce stress and improve awareness.  Instructor also leads participants through a meditation exercise.    Stretching for Flexibility and Mobility:  -Group  instruction provided by verbal instruction, patient participation, and written materials to support subject.  Instructors lead participants through series of stretches that are designed to increase flexibility thus improving mobility.  These stretches are additional exercise for major muscle groups that are typically performed during regular warm up and cool down.   Hands Only CPR:  -Group verbal, video, and participation provides a basic overview of AHA guidelines for community CPR. Role-play of emergencies allow participants the opportunity to practice calling for help and chest compression technique with discussion of AED use.   Hypertension: -Group verbal and written instruction that provides a basic overview of hypertension including the most recent diagnostic guidelines, risk factor reduction with self-care instructions and medication management.    Nutrition I class: Heart Healthy Eating:  -Group instruction provided by PowerPoint slides, verbal discussion, and written materials to support subject matter. The instructor gives an explanation and review of the Therapeutic Lifestyle Changes diet recommendations, which includes a discussion on lipid goals, dietary fat, sodium, fiber,  plant stanol/sterol esters, sugar, and the components of a well-balanced, healthy diet.   Nutrition II class: Lifestyle Skills:  -Group instruction provided by PowerPoint slides, verbal discussion, and written materials to support subject matter. The instructor gives an explanation and review of label reading, grocery shopping for heart health, heart healthy recipe modifications, and ways to make healthier choices when eating out.   CARDIAC REHAB PHASE II EXERCISE from 02/12/2017 in [facility] CARDIAC REHAB  Date  02/13/17  Educator  RD  Instruction Review Code  2- meets goals/outcomes      Diabetes Question & Answer:  -Group instruction provided by PowerPoint slides, verbal discussion, and  written materials to support subject matter. The instructor gives an explanation and review of diabetes co-morbidities, pre- and post-prandial blood glucose goals, pre-exercise blood glucose goals, signs, symptoms, and treatment of hypoglycemia and hyperglycemia, and foot care basics.   Diabetes Blitz:  -Group instruction provided by PowerPoint slides, verbal discussion, and written materials to support subject matter. The instructor gives an explanation and review of the physiology behind type 1 and type 2 diabetes, diabetes medications and rational behind using different medications, pre- and post-prandial blood glucose recommendations and Hemoglobin A1c goals, diabetes diet, and exercise including blood glucose guidelines for exercising safely.    Portion Distortion:  -Group instruction provided by PowerPoint slides, verbal discussion, written materials, and food models to support subject matter. The instructor gives an explanation of serving size versus portion size, changes in portions sizes over the last 20 years, and what consists of a serving from each food group.   Stress Management:  -Group instruction provided by verbal instruction, video, and written materials to support subject matter.  Instructors review role of stress in heart disease and how to cope with stress positively.     Exercising on Your Own:  -Group instruction provided by verbal instruction, power point, and written materials to support subject.  Instructors discuss benefits of exercise, components of exercise, frequency and intensity of exercise, and end points for exercise.  Also discuss use of nitroglycerin and activating EMS.  Review options of places to exercise outside of rehab.  Review guidelines for sex with heart disease.   Cardiac Drugs I:  -Group instruction provided by verbal instruction and written materials to support subject.  Instructor reviews cardiac drug classes: antiplatelets, anticoagulants, beta  blockers, and statins.  Instructor discusses reasons, side effects, and lifestyle considerations for each drug class.   Cardiac Drugs II:  -Group instruction provided by verbal instruction and written materials to support subject.  Instructor reviews cardiac drug classes: angiotensin converting enzyme inhibitors (ACE-I), angiotensin II receptor blockers (ARBs), nitrates, and calcium channel blockers.  Instructor discusses reasons, side effects, and lifestyle considerations for each drug class.   Anatomy and Physiology of the Circulatory System:  Group verbal and written instruction and models provide basic cardiac anatomy and physiology, with the coronary electrical and arterial systems. Review of: AMI, Angina, Valve disease, Heart Failure, Peripheral Artery Disease, Cardiac Arrhythmia, Pacemakers, and the ICD.   Other Education:  -Group or individual verbal, written, or video instructions that support the educational goals of the cardiac rehab program.   Knowledge Questionnaire Score:   Core Components/Risk Factors/Patient Goals at Admission:     Personal Goals and Risk Factors at Admission - 01/16/17 1232      Core Components/Risk Factors/Patient Goals on Admission    Weight Management Yes;Obesity   Intervention Weight Management/Obesity: Establish reasonable short term and long term weight goals.;Obesity:  Provide education and appropriate resources to help participant work on and attain dietary goals.;Weight Management: Provide education and appropriate resources to help participant work on and attain dietary goals.;Weight Management: Develop a combined nutrition and exercise program designed to reach desired caloric intake, while maintaining appropriate intake of nutrient and fiber, sodium and fats, and appropriate energy expenditure required for the weight goal.   Admit Weight 228 lb 2.8 oz (103.5 kg)   Goal Weight: Short Term 222 lb 3.2 oz (100.8 kg)   Goal Weight: Long Term 203 lb  3.2 oz (92.2 kg)   Expected Outcomes Short Term: Continue to assess and modify interventions until short term weight is achieved;Long Term: Adherence to nutrition and physical activity/exercise program aimed toward attainment of established weight goal;Weight Loss: Understanding of general recommendations for a balanced deficit meal plan, which promotes 1-2 lb weight loss per week and includes a negative energy balance of [phone] kcal/d   Diabetes Yes   Intervention Provide education about signs/symptoms and action to take for hypo/hyperglycemia.;Provide education about proper nutrition, including hydration, and aerobic/resistive exercise prescription along with prescribed medications to achieve blood glucose in normal ranges: Fasting glucose 65-99 mg/dL   Expected Outcomes Short Term: Participant verbalizes understanding of the signs/symptoms and immediate care of hyper/hypoglycemia, proper foot care and importance of medication, aerobic/resistive exercise and nutrition plan for blood glucose control.;Long Term: Attainment of HbA1C < 7%.   Hypertension Yes   Intervention Provide education on lifestyle modifcations including regular physical activity/exercise, weight management, moderate sodium restriction and increased consumption of fresh fruit, vegetables, and low fat dairy, alcohol moderation, and smoking cessation.;Monitor prescription use compliance.   Expected Outcomes Short Term: Continued assessment and intervention until BP is < 140/82mm HG in hypertensive participants. < 130/17mm HG in hypertensive participants with diabetes, heart failure or chronic kidney disease.;Long Term: Maintenance of blood pressure at goal levels.   Lipids Yes   Intervention Provide education and support for participant on nutrition & aerobic/resistive exercise along with prescribed medications to achieve LDL 70mg , HDL >40mg .   Expected Outcomes Short Term: Participant states understanding of desired cholesterol values  and is compliant with medications prescribed. Participant is following exercise prescription and nutrition guidelines.;Long Term: Cholesterol controlled with medications as prescribed, with individualized exercise RX and with personalized nutrition plan. Value goals: LDL < 70mg , HDL > 40 mg.   Personal Goal Other Yes   Personal Goal Get back to normal self.   Intervention Provide exercise routine to help improve energy, strength, and stamina, so that patient can return to previous activities.   Expected Outcomes Patient will resume normal activites and "get back to normal self" as eveidenced by patient personal statement.      Core Components/Risk Factors/Patient Goals Review:      Goals and Risk Factor Review    Row Name 02/12/17 4098 03/02/17 1116 03/06/17 0950 04/03/17 0741       Core Components/Risk Factors/Patient Goals Review   Personal Goals Review Weight Management/Obesity;Diabetes;Hypertension;Lipids;Other Weight Management/Obesity Weight Management/Obesity Weight Management/Obesity    Review pt with muliple CAD RF demonstrates eagerness to participate in CR activities. pt with muliple CAD RF demonstrates eagerness to participate in CR activities. pt with muliple CAD RF demonstrates eagerness to participate in CR activities. pt using Treadmill for home exercise and is looking forward to resuming resistence training.   pt with muliple CAD RF demonstrates eagerness to participate in CR activities. pt using Treadmill for home exercise and is looking forward to resuming resistence training.  Expected Outcomes pt will participate in CR exercise, nutrition and lifestyle modification education to decrease overall RF.  pt will participate in CR exercise, nutrition and lifestyle modification education to decrease overall RF.  pt will participate in CR exercise, nutrition and lifestyle modification education to decrease overall RF.  pt will participate in CR exercise, nutrition and lifestyle  modification education to decrease overall RF.        Core Components/Risk Factors/Patient Goals at Discharge (Final Review):      Goals and Risk Factor Review - 04/03/17 0741      Core Components/Risk Factors/Patient Goals Review   Personal Goals Review Weight Management/Obesity   Review pt with muliple CAD RF demonstrates eagerness to participate in CR activities. pt using Treadmill for home exercise and is looking forward to resuming resistence training.     Expected Outcomes pt will participate in CR exercise, nutrition and lifestyle modification education to decrease overall RF.       ITP Comments:     ITP Comments    Row Name 01/16/17 0950 01/17/17 1642 02/14/17 0821 03/13/17 1520 04/03/17 0741   ITP Comments Dr. Armanda Magic, Medical Director  Dr. Armanda Magic, Medical Director  Dr. Armanda Magic, Medical Director  30 day ITP review.  Pt with good participation and attendance.  No change in current regimen unless directed by Medical Director.   30 day ITP review.  Pt with good participation and attendance.  No change in current regimen unless directed by Medical Director.        Comments:

## 2017-04-09 ENCOUNTER — Encounter (HOSPITAL_COMMUNITY): Payer: Federal, State, Local not specified - PPO

## 2017-04-11 ENCOUNTER — Encounter (HOSPITAL_COMMUNITY): Payer: Federal, State, Local not specified - PPO

## 2017-04-13 ENCOUNTER — Encounter (HOSPITAL_COMMUNITY): Payer: Federal, State, Local not specified - PPO

## 2017-04-16 ENCOUNTER — Encounter (HOSPITAL_COMMUNITY)
Admission: RE | Admit: 2017-04-16 | Discharge: 2017-04-16 | Disposition: A | Discharge status: Home / Self Care | Payer: Federal, State, Local not specified - PPO | Source: Ambulatory Visit | Attending: Cardiology | Admitting: Cardiology

## 2017-04-16 DIAGNOSIS — I214 Non-ST elevation (NSTEMI) myocardial infarction: Secondary | ICD-10-CM | POA: Insufficient documentation

## 2017-04-16 DIAGNOSIS — Z955 Presence of coronary angioplasty implant and graft: Secondary | ICD-10-CM | POA: Insufficient documentation

## 2017-04-18 ENCOUNTER — Ambulatory Visit: Payer: Federal, State, Local not specified - PPO | Admitting: Cardiology

## 2017-04-18 ENCOUNTER — Encounter (HOSPITAL_COMMUNITY)
Admission: RE | Admit: 2017-04-18 | Discharge: 2017-04-18 | Disposition: A | Discharge status: Home / Self Care | Payer: Federal, State, Local not specified - PPO | Source: Ambulatory Visit | Attending: Cardiology | Admitting: Cardiology

## 2017-04-18 ENCOUNTER — Encounter (INDEPENDENT_AMBULATORY_CARE_PROVIDER_SITE_OTHER): Payer: Self-pay

## 2017-04-18 ENCOUNTER — Encounter: Payer: Self-pay | Admitting: Cardiology

## 2017-04-18 VITALS — BP 100/64 | HR 94 | Resp 16 | Ht 72.0 in | Wt 223.6 lb

## 2017-04-18 DIAGNOSIS — I10 Essential (primary) hypertension: Secondary | ICD-10-CM | POA: Diagnosis not present

## 2017-04-18 DIAGNOSIS — I214 Non-ST elevation (NSTEMI) myocardial infarction: Secondary | ICD-10-CM

## 2017-04-18 DIAGNOSIS — E1100 Type 2 diabetes mellitus with hyperosmolarity without nonketotic hyperglycemic-hyperosmolar coma (NKHHC): Secondary | ICD-10-CM | POA: Diagnosis not present

## 2017-04-18 DIAGNOSIS — Z955 Presence of coronary angioplasty implant and graft: Secondary | ICD-10-CM

## 2017-04-18 DIAGNOSIS — I952 Hypotension due to drugs: Secondary | ICD-10-CM | POA: Diagnosis not present

## 2017-04-18 NOTE — Progress Notes (Signed)
Cardiology Office Note:    Date:  04/18/2017   ID:  Connor Frohlicherry L Macbeth, DOB 08/15/1960, MRN [id number]  PCP:  Rozann LeschesNedrud, Marybeth, MD  Cardiologist:  Donato SchultzMark Kayshawn Ozburn, MD    Referring MD: Rozann LeschesNedrud, Marybeth, MD   Chief Complaint  Patient presents with  . Follow-up    heart problems MI     History of Present Illness:    Connor Reed is a 56 y.o. male with a hx of non-ST elevation myocardial infarction in July 2018 with 99% proximal LAD 80% circumflex with subsequent stenting to this vessel with DES, ejection fraction 40% during cardiac catheterization however during echocardiogram 2 days later on 12/20/16 his ejection fraction had returned to normal post revascularization.  Here for follow-up.  He has had some dizziness, hypotension.  His blood pressure today is 100/64.  He has only been able to tolerate low-dose lisinopril at 5 mg which we will go ahead and discontinue today because of hypotension.  He has been losing approximately 20 pounds.  He is just about off of the metformin he states.  He denies any recurrent symptoms, no chest pain, no shortness of breath, no syncope, no fevers, no bleeding.  Interestingly, he thought that he had a an episode of bronchitis when he had his original heart attack.  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   Past Medical History:  Diagnosis Date  . Asthma   . Chronic bronchitis (HCC)   . Hypertension     History reviewed. No pertinent surgical history.  Current Medications: Current Meds  Medication Sig  . aspirin 81 MG chewable tablet Chew 1 tablet (81 mg total) by mouth daily.  Marland Kitchen. atorvastatin (LIPITOR) 40 MG tablet Take 1 tablet (40 mg total) by mouth daily at 6 PM.  . clopidogrel (PLAVIX) 75 MG tablet Take 1 tablet (75 mg total) by mouth daily with breakfast.  . Fluticasone-Salmeterol (ADVAIR DISKUS) 100-50 MCG/DOSE AEPB Inhale 1 puff into the lungs 2 (two) times daily.  Marland Kitchen. glucose blood (ONETOUCH VERIO) test  strip Test blood sugar two times a day. Diagnosis code E11.00, non-insulin dependent diabetes  . lisinopril (PRINIVIL,ZESTRIL) 5 MG tablet Take 1 tablet (5 mg total) by mouth daily.  . metFORMIN (GLUCOPHAGE) 500 MG tablet Take 1 500 mg tablet twice a day.  . nitroGLYCERIN (NITROSTAT) 0.4 MG SL tablet Place 1 tablet (0.4 mg total) under the tongue every 5 (five) minutes as needed for chest pain.  Letta Pate. ONETOUCH DELICA LANCETS FINE MISC Use to check blood sugar two times daily. Diagnosis code:E11.00  . PROAIR HFA 108 (90 Base) MCG/ACT inhaler Inhale 2 puffs into the lungs 4 (four) times daily as needed for wheezing.     Allergies:   Acetaminophen and Penicillins   Social History   Socioeconomic History  . Marital status: Married    Spouse name: None  . Number of children: None  . Years of education: None  . Highest education level: None  Social Needs  . Financial resource strain: None  . Food insecurity - worry: None  . Food insecurity - inability: None  . Transportation needs - medical: None  . Transportation needs - non-medical: None  Occupational History  . None  Tobacco Use  . Smoking status: Former Smoker    Packs/day: 1.00    Years: 20.00    Pack years: 20.00    Last attempt to quit: 06/19/1990    Years since quitting: 26.8  .  Smokeless tobacco: Never Used  Substance and Sexual Activity  . Alcohol use: Yes    Comment: drank 12 pack yesterday; typically 1 x per week  . Drug use: No  . Sexual activity: None  Other Topics Concern  . None  Social History Narrative  . None     Family History: The patient's family history includes Cancer in his father; Diabetes in his brother; Heart attack in his maternal grandfather. ROS:   Please see the history of present illness.     All other systems reviewed and are negative.  EKGs/Labs/Other Studies Reviewed:    The following studies were reviewed today: Prior hospital records reviewed, lab work reviewed echo reviewed,  catheterization reviewed  EKG: None today  Recent Labs: 12/18/2016: TSH 1.461 02/23/2017: ALT 22; BUN 13; Creatinine, Ser 1.08; Hemoglobin 14.4; Platelets 247; Potassium 4.2; Sodium 139  Recent Lipid Panel    Component Value Date/Time   CHOL 124 02/23/2017 0816   TRIG 103 02/23/2017 0816   HDL 32 (L) 02/23/2017 0816   CHOLHDL 3.9 02/23/2017 0816   CHOLHDL 5.2 12/18/2016 0317   VLDL 48 (H) 12/18/2016 0317   LDLCALC 71 02/23/2017 0816    Physical Exam:    VS:  BP 100/64   Pulse 94   Resp 16   Ht 6' (1.829 m)   Wt 223 lb 9.6 oz (101.4 kg)   SpO2 98%   BMI 30.33 kg/m     Wt Readings from Last 3 Encounters:  04/18/17 223 lb 9.6 oz (101.4 kg)  02/21/17 227 lb 12.8 oz (103.3 kg)  02/12/17 233 lb 9.6 oz (106 kg)     GEN:  Well nourished, well developed in no acute distress HEENT: Normal NECK: No JVD; No carotid bruits LYMPHATICS: No lymphadenopathy CARDIAC: RRR, no murmurs, rubs, gallops RESPIRATORY:  Clear to auscultation without rales, wheezing or rhonchi  ABDOMEN: Soft, non-tender, non-distended MUSCULOSKELETAL:  No edema; No deformity  SKIN: Warm and dry NEUROLOGIC:  Alert and oriented x 3 PSYCHIATRIC:  Normal affect   ASSESSMENT:    No diagnosis found. PLAN:    In order of problems listed above:   CAD/ischemic cardiomyopathy - We will check an echocardiogram.  Prior EF 40%a` on catheterization however on echocardiogram 2 days later his ejection fraction was normalized post revascularization.  Excellent. -Continue with dual antiplatelet therapy until July 2019.  Aspirin, Plavix.  Hypotension -Some associated dizziness.  He has not been able to tolerate carvedilol in the past.  I will go ahead and discontinue his lisinopril.  Only 5 mg.  Diabetes with hypertension - Metformin, he has been able to decrease.  Previous hemoglobin A1c was 10.  Unable to take lisinopril for renal protection because of hypotension.  We will see him back in 6 months, Norma FredricksonLori  Gerhardt.  Medication Adjustments/Labs and Tests Ordered: Current medicines are reviewed at length with the patient today.  Concerns regarding medicines are outlined above.  No orders of the defined types were placed in this encounter.  No orders of the defined types were placed in this encounter.   Signed, Donato SchultzMark Amulya Quintin, MD  04/18/2017 2:44 PM    [facility]

## 2017-04-18 NOTE — Patient Instructions (Addendum)
Medication Instructions:  Please discontinue your Lisinopril. Continue all other medications as listed.  Follow-Up: Follow up in 6 months with Lori Gerhardt, NP.  You will receive a letter in the mail 2 months before you are due.  Please call us when you receive this letter to schedule your follow up appointment.  Follow up in 1 year with Dr. Skains.  You will receive a letter in the mail 2 months before you are due.  Please call us when you receive this letter to schedule your follow up appointment.  If you need a refill on your cardiac medications before your next appointment, please call your pharmacy.  Thank you for choosing [facility]!!     

## 2017-04-20 ENCOUNTER — Encounter (HOSPITAL_COMMUNITY)
Admission: RE | Admit: 2017-04-20 | Discharge: 2017-04-20 | Disposition: A | Discharge status: Home / Self Care | Payer: Federal, State, Local not specified - PPO | Source: Ambulatory Visit | Attending: Cardiology | Admitting: Cardiology

## 2017-04-20 DIAGNOSIS — I214 Non-ST elevation (NSTEMI) myocardial infarction: Secondary | ICD-10-CM

## 2017-04-20 DIAGNOSIS — Z955 Presence of coronary angioplasty implant and graft: Secondary | ICD-10-CM

## 2017-04-23 ENCOUNTER — Encounter (HOSPITAL_COMMUNITY)
Admission: RE | Admit: 2017-04-23 | Discharge: 2017-04-23 | Disposition: A | Discharge status: Home / Self Care | Payer: Federal, State, Local not specified - PPO | Source: Ambulatory Visit | Attending: Cardiology | Admitting: Cardiology

## 2017-04-23 DIAGNOSIS — I214 Non-ST elevation (NSTEMI) myocardial infarction: Secondary | ICD-10-CM

## 2017-04-23 DIAGNOSIS — Z955 Presence of coronary angioplasty implant and graft: Secondary | ICD-10-CM

## 2017-04-25 ENCOUNTER — Encounter (HOSPITAL_COMMUNITY)
Admission: RE | Admit: 2017-04-25 | Discharge: 2017-04-25 | Disposition: A | Discharge status: Home / Self Care | Payer: Federal, State, Local not specified - PPO | Source: Ambulatory Visit | Attending: Cardiology | Admitting: Cardiology

## 2017-04-25 DIAGNOSIS — Z955 Presence of coronary angioplasty implant and graft: Secondary | ICD-10-CM | POA: Diagnosis not present

## 2017-04-25 DIAGNOSIS — I214 Non-ST elevation (NSTEMI) myocardial infarction: Secondary | ICD-10-CM | POA: Diagnosis not present

## 2017-04-30 ENCOUNTER — Encounter (HOSPITAL_COMMUNITY)
Admission: RE | Admit: 2017-04-30 | Discharge: 2017-04-30 | Disposition: A | Discharge status: Home / Self Care | Payer: Federal, State, Local not specified - PPO | Source: Ambulatory Visit | Attending: Cardiology | Admitting: Cardiology

## 2017-04-30 DIAGNOSIS — Z955 Presence of coronary angioplasty implant and graft: Secondary | ICD-10-CM | POA: Diagnosis not present

## 2017-04-30 DIAGNOSIS — I214 Non-ST elevation (NSTEMI) myocardial infarction: Secondary | ICD-10-CM

## 2017-05-02 ENCOUNTER — Encounter (HOSPITAL_COMMUNITY)
Admission: RE | Admit: 2017-05-02 | Discharge: 2017-05-02 | Disposition: A | Discharge status: Home / Self Care | Payer: Federal, State, Local not specified - PPO | Source: Ambulatory Visit | Attending: Cardiology | Admitting: Cardiology

## 2017-05-02 DIAGNOSIS — Z955 Presence of coronary angioplasty implant and graft: Secondary | ICD-10-CM | POA: Diagnosis not present

## 2017-05-02 DIAGNOSIS — I214 Non-ST elevation (NSTEMI) myocardial infarction: Secondary | ICD-10-CM | POA: Diagnosis not present

## 2017-05-04 ENCOUNTER — Encounter (HOSPITAL_COMMUNITY)
Admission: RE | Admit: 2017-05-04 | Discharge: 2017-05-04 | Disposition: A | Discharge status: Home / Self Care | Payer: Federal, State, Local not specified - PPO | Source: Ambulatory Visit | Attending: Cardiology | Admitting: Cardiology

## 2017-05-04 DIAGNOSIS — I214 Non-ST elevation (NSTEMI) myocardial infarction: Secondary | ICD-10-CM

## 2017-05-04 DIAGNOSIS — Z955 Presence of coronary angioplasty implant and graft: Secondary | ICD-10-CM | POA: Diagnosis not present

## 2017-05-04 NOTE — Progress Notes (Addendum)
Cardiac Individual Treatment Plan  Patient Details  Name: Connor Reed MRN: [id number] Date of Birth: 03/26/1961 Referring Provider:     CARDIAC REHAB PHASE [facility]  Referring Provider  Donato SchultzSkains, Mark, MD.      Initial Encounter Date:    CARDIAC REHAB PHASE II [facility]IENTATION [facility]  Date  01/16/17  Referring Provider  Donato SchultzSkains, Mark, MD.      Visit Diagnosis: Stented coronary artery  NSTEMI (non-ST elevated myocardial infarction) [facility]HCC)  Patient's Home Medications on Admission:  Current Outpatient Medications:  .  aspirin 81 MG chewable tablet, Chew 1 tablet (81 mg total) by mouth daily., Disp: 30 tablet, Rfl: 5 .  atorvastatin (LIPITOR) 40 MG tablet, Take 1 tablet (40 mg total) by mouth daily at 6 PM., Disp: 30 tablet, Rfl: 6 .  clopidogrel (PLAVIX) 75 MG tablet, Take 1 tablet (75 mg total) by mouth daily with breakfast., Disp: 30 tablet, Rfl: 11 .  Fluticasone-Salmeterol (ADVAIR DISKUS) 100-50 MCG/DOSE AEPB, Inhale 1 puff into the lungs 2 (two) times daily., Disp: 60 each, Rfl: 3 .  glucose blood (ONETOUCH VERIO) test strip, Test blood sugar two times a day. Diagnosis code E11.00, non-insulin dependent diabetes, Disp: 100 each, Rfl: 3 .  metFORMIN (GLUCOPHAGE) 500 MG tablet, Take 1 500 mg tablet twice a day., Disp: 60 tablet, Rfl: 5 .  nitroGLYCERIN (NITROSTAT) 0.4 MG SL tablet, Place 1 tablet (0.4 mg total) under the tongue every 5 (five) minutes as needed for chest pain., Disp: 25 tablet, Rfl: 3 .  ONETOUCH DELICA LANCETS FINE MISC, Use to check blood sugar two times daily. Diagnosis code:E11.00, Disp: 100 each, Rfl: 3 .  PROAIR HFA 108 (90 Base) MCG/ACT inhaler, Inhale 2 puffs into the lungs 4 (four) times daily as needed for wheezing., Disp: 18 g, Rfl: 3  Past Medical History: Past Medical History:  Diagnosis Date  . Asthma   . Chronic bronchitis (HCC)   .  Hypertension     Tobacco Use: Social History   Tobacco Use  Smoking Status Former Smoker  . Packs/day: 1.00  . Years: 20.00  . Pack years: 20.00  . Last attempt to quit: 06/19/1990  . Years since quitting: 26.8  Smokeless Tobacco Never Used    Labs: Recent Hydrographic [facility]view Flowsheet Data    Labs for ITP Cardiac and Pulmonary Rehab Latest Ref Rng & Units 12/17/2016 12/18/2016 02/23/2017   Cholestrol 100 - 199 mg/dL - 324161 [id number]   LDLCALC 0 - 99 mg/dL - 82 71   HDL >02>39 mg/dL - 72(Z31(L) 36(U32(L)   Trlycerides 0 - 149 mg/dL - 440(H238(H) 474103   Hemoglobin A1c 4.8 - 5.6 % 10.3(H) - -      Capillary Blood Glucose: Lab Results  Component Value Date   GLUCAP 134 (H) 03/12/2017   GLUCAP 119 (H) 02/12/2017   GLUCAP 112 (H) 01/26/2017   GLUCAP 168 (H) 01/24/2017   GLUCAP 128 (H) 01/24/2017     Exercise Target Goals:    Exercise Program Goal: Individual exercise prescription set with THRR, safety & activity barriers. Participant demonstrates ability to understand and report RPE using BORG scale, to self-measure pulse accurately, and to acknowledge the importance of the exercise prescription.  Exercise Prescription Goal: Starting with aerobic activity 30 plus minutes a day, 3 days per week for initial exercise prescription. Provide home exercise prescription and guidelines that participant acknowledges understanding prior to discharge.  Activity Barriers & Risk Stratification: Activity Barriers & Cardiac Risk Stratification - 01/16/17 1135      Activity Barriers & Cardiac Risk Stratification   Activity Barriers  Arthritis;Other (comment)    Comments  Left hip pain    Cardiac Risk Stratification  High       6 Minute Walk: 6 Minute Walk    Row Name 01/16/17 1134 04/25/17 0723       6 Minute Walk   Phase  Initial  Discharge    Distance  1636 feet  1988 feet    Distance % Change  -  21.52 %    Walk Time  6 minutes  6 minutes    # of Rest Breaks  0  0    MPH  3.1  3.77    METS  4.04  4.81     RPE  11  7    VO2 Peak  14.13  16.83    Symptoms  No  No    Resting HR  73 bpm  75 bpm    Resting BP  100/60  102/80    Max Ex. HR  120 bpm  126 bpm    Max Ex. BP  112/60  122/82    2 Minute Post BP  110/62  122/84       Oxygen Initial Assessment:   Oxygen Re-Evaluation:   Oxygen Discharge (Final Oxygen Re-Evaluation):   Initial Exercise Prescription: Initial Exercise Prescription - 01/16/17 1100      Date of Initial Exercise RX and Referring Provider   Date  01/16/17    Referring Provider  Donato Schultz, MD.      Treadmill   MPH  3.4    Grade  0    Minutes  10    METs  3.6      Bike   Level  1.7    Minutes  10    METs  4.13      NuStep   Level  4    SPM  95    Minutes  10    METs  3.3      Prescription Details   Frequency (times per week)  3    Duration  Progress to 30 minutes of continuous aerobic without signs/symptoms of physical distress      Intensity   THRR 40-80% of Max Heartrate  65-131    Ratings of Perceived Exertion  11-13    Perceived Dyspnea  0-4      Progression   Progression  Continue to progress workloads to maintain intensity without signs/symptoms of physical distress.      Resistance Training   Training Prescription  Yes    Weight  4lbs    Reps  10-15       Perform Capillary Blood Glucose checks as needed.  Exercise Prescription Changes: Exercise Prescription Changes    Row Name 01/24/17 1700 02/07/17 1800 02/09/17 0900 02/20/17 1600 03/05/17 1600     Response to Exercise   Blood Pressure (Admit)  124/76  122/74  120/80  112/76  138/84   Blood Pressure (Exercise)  170/84  140/68  146/82  160/72  140/70   Blood Pressure (Exit)  114/70  110/80  120/82  118/70  132/80   Heart Rate (Admit)  76 bpm  76 bpm  65 bpm  80 bpm  79 bpm   Heart Rate (Exercise)  120 bpm  113 bpm  128 bpm  127 bpm  143 bpm  Heart Rate (Exit)  84 bpm  74 bpm  75 bpm  77 bpm  79 bpm   Rating of Perceived Exertion (Exercise)  -  -  -  11  11   Symptoms   none  none  none  none  none   Comments  Off to a good start with exercise.  -  -  -  -   Duration  Continue with 30 min of aerobic exercise without signs/symptoms of physical distress.  Continue with 30 min of aerobic exercise without signs/symptoms of physical distress.  Continue with 30 min of aerobic exercise without signs/symptoms of physical distress.  Continue with 30 min of aerobic exercise without signs/symptoms of physical distress.  Continue with 30 min of aerobic exercise without signs/symptoms of physical distress.   Intensity  THRR unchanged  THRR unchanged  THRR unchanged  THRR unchanged  THRR unchanged     Progression   Progression  Continue to progress workloads to maintain intensity without signs/symptoms of physical distress.  Continue to progress workloads to maintain intensity without signs/symptoms of physical distress.  Continue to progress workloads to maintain intensity without signs/symptoms of physical distress.  Continue to progress workloads to maintain intensity without signs/symptoms of physical distress.  Continue to progress workloads to maintain intensity without signs/symptoms of physical distress.   Average METs  3.6  3.6  3.6  4.3  4.4     Resistance Training   Training Prescription  Yes  Yes  Yes  Yes  Yes   Weight  4lbs  5lbs  5lbs  8lbs  8lbs   Reps  10-15  10-15  10-15  10-15  10-15   Time  -  -  -  -  10 Minutes     Interval Training   Interval Training  No  No  No  No  No     Treadmill   MPH  3.4  3.4  3.4  3.4  3.5   Grade  0  0  0  0  2   Minutes  10  10  10  10  10    METs  3.6  3.6  3.6  3.6  4.65     Bike   Level  1.7  1.7  1.7  1.7  1.7   Minutes  10  10  10  10  10    METs  4.1  4.11  4.1  4.12  4.11     NuStep   Level  4  4  5  5  6    SPM  95  95  95  95  95   Minutes  10  10  10  10  10    METs  3.2  2.8  3.2  5.1  4.5     Home Exercise Plan   Plans to continue exercise at  -  -  Home (comment)  Home (comment)  Home (comment)    Frequency  -  -  Add 3 additional days to program exercise sessions.  Add 3 additional days to program exercise sessions.  Add 3 additional days to program exercise sessions.   Initial Home Exercises Provided  -  -  02/09/17  02/09/17  02/09/17   Row Name 03/19/17 0800 04/02/17 0900 04/18/17 0650 05/02/17 0800       Response to Exercise   Blood Pressure (Admit)  122/82  132/82  118/82  104/70    Blood Pressure (Exercise)  144/78  158/78  148/72  138/80    Blood Pressure (Exit)  110/70  112/78  120/80  124/83    Heart Rate (Admit)  79 bpm  68 bpm  95 bpm  82 bpm    Heart Rate (Exercise)  138 bpm  140 bpm  147 bpm  137 bpm    Heart Rate (Exit)  75 bpm  76 bpm  84 bpm  82 bpm    Rating of Perceived Exertion (Exercise)  11  11  12  11     Symptoms  none  none  none  none    Duration  Continue with 30 min of aerobic exercise without signs/symptoms of physical distress.  Continue with 30 min of aerobic exercise without signs/symptoms of physical distress.  Continue with 30 min of aerobic exercise without signs/symptoms of physical distress.  Continue with 30 min of aerobic exercise without signs/symptoms of physical distress.    Intensity  THRR unchanged  THRR unchanged  THRR unchanged  THRR unchanged      Progression   Progression  Continue to progress workloads to maintain intensity without signs/symptoms of physical distress.  Continue to progress workloads to maintain intensity without signs/symptoms of physical distress.  Continue to progress workloads to maintain intensity without signs/symptoms of physical distress.  Continue to progress workloads to maintain intensity without signs/symptoms of physical distress.    Average METs  4.9  5.1  5.2  5      Resistance Training   Training Prescription  Yes  Yes  Yes  No Relaxation today    Weight  8lbs  8lbs  8lbs  -    Reps  10-15  10-15  10-15  -    Time  10 Minutes  10 Minutes  10 Minutes  -      Interval Training   Interval Training  No  No   No  No      Treadmill   MPH  3.7  3.8  3.8  3.8    Grade  3  4  4  4     Minutes  10  10  10  10     METs  5.36  6  6  6       Bike   Level  2  2  2  2     Minutes  10  10  10  10     METs  4.68  4.71  4.7  4.67      NuStep   Level  6  6  6  6     SPM  95  95  95  95    Minutes  10  10  10  10     METs  4.7  4.6  4.8  4.4      Home Exercise Plan   Plans to continue exercise at  Home (comment)  Home (comment)  Home (comment)  Home (comment)    Frequency  Add 3 additional days to program exercise sessions.  Add 3 additional days to program exercise sessions.  Add 3 additional days to program exercise sessions.  Add 3 additional days to program exercise sessions.    Initial Home Exercises Provided  02/09/17  02/09/17  02/09/17  02/09/17       Exercise Comments: Exercise Comments    Row Name 01/24/17 1729 02/07/17 1810 02/09/17 0920 02/21/17 0904 03/07/17 0700   Exercise Comments  Off to a good start with exercise.  Reviewed METs and goals.  Reviewed  home exercise guidelines. Pt has a treadmill at home that he is using 45-60 minutes, 2-3 days/week.  Reviewed METs with patient.  Reviewed METs and goals with patient.   Row Name 03/19/17 1610 04/02/17 9604 04/20/17 0720 05/02/17 0700     Exercise Comments  Reviewed METs with patient.  Reviewed goals with patient.  Reviewed METs and goals with patient.  Reviewed METs with patient.       Exercise Goals and Review: Exercise Goals    Row Name 01/16/17 1135             Exercise Goals   Increase Physical Activity  Yes       Intervention  Provide advice, education, support and counseling about physical activity/exercise needs.;Develop an individualized exercise prescription for aerobic and resistive training based on initial evaluation findings, risk stratification, comorbidities and participant's personal goals.       Expected Outcomes  Achievement of increased cardiorespiratory fitness and enhanced flexibility, muscular endurance and  strength shown through measurements of functional capacity and personal statement of participant.       Increase Strength and Stamina  Yes       Intervention  Provide advice, education, support and counseling about physical activity/exercise needs.;Develop an individualized exercise prescription for aerobic and resistive training based on initial evaluation findings, risk stratification, comorbidities and participant's personal goals.       Expected Outcomes  Achievement of increased cardiorespiratory fitness and enhanced flexibility, muscular endurance and strength shown through measurements of functional capacity and personal statement of participant.          Exercise Goals Re-Evaluation : Exercise Goals Re-Evaluation    Row Name 02/07/17 1809 03/07/17 0700 04/02/17 5409 04/20/17 0720 04/25/17 0723     Exercise Goal Re-Evaluation   Exercise Goals Review  Increase Physical Activity;Increase Strength and Stamina  Increase Physical Activity;Knowledge and understanding of Target Heart Rate Range (THRR);Able to understand and use rate of perceived exertion (RPE) scale  Increase Physical Activity  Increase Physical Activity  Increase Physical Activity;Increase Strength and Stamina   Comments  Pt sstates that he is "feeling a whole lot better". Making good progress with exercise. Pt is also back to golfing.  Patient is walking on treadmill at home daily, 20-60 minutes. Reviewed THRR. Pt is able to understand and use the RPE scale appropriately.  Patient has home gym and is exercising 5 days/week, 1-1.5 hours.  Patient is  walking 20-30 minutes on his TM and 10-15 minutes on his stationary bike at home. Making good progress with exercise.  Patient improved functional capacity 22% as measured by and improved strength 3% as measured by grip strength test. Flexibility improved 50%. Pt states he feels satisfied with the program and plans to continue exercise at home upon completion of CR.   Expected  Outcomes  Continue exercise progression to see improvement in overall increase in stamina and help achieve weight loss goals.  Continue daily exercise 30-60 minutes.  Continue daily exercise 30-60 minutes to achieve health and fitness goals.  Continue daily exercise 30-60 minutes to achieve health and fitness goals.  Patient will continue exercise using his home gym to maintain health and fitness improvements.       Discharge Exercise Prescription (Final Exercise Prescription Changes): Exercise Prescription Changes - 05/02/17 0800      Response to Exercise   Blood Pressure (Admit)  104/70    Blood Pressure (Exercise)  138/80    Blood Pressure (Exit)  124/83    Heart  Rate (Admit)  82 bpm    Heart Rate (Exercise)  137 bpm    Heart Rate (Exit)  82 bpm    Rating of Perceived Exertion (Exercise)  11    Symptoms  none    Duration  Continue with 30 min of aerobic exercise without signs/symptoms of physical distress.    Intensity  THRR unchanged      Progression   Progression  Continue to progress workloads to maintain intensity without signs/symptoms of physical distress.    Average METs  5      Resistance Training   Training Prescription  No Relaxation today      Interval Training   Interval Training  No      Treadmill   MPH  3.8    Grade  4    Minutes  10    METs  6      Bike   Level  2    Minutes  10    METs  4.67      NuStep   Level  6    SPM  95    Minutes  10    METs  4.4      Home Exercise Plan   Plans to continue exercise at  Home (comment)    Frequency  Add 3 additional days to program exercise sessions.    Initial Home Exercises Provided  02/09/17       Nutrition:  Target Goals: Understanding of nutrition guidelines, daily intake of sodium 1500mg , cholesterol 200mg , calories 30% from fat and 7% or less from saturated fats, daily to have 5 or more servings of fruits and vegetables.  Biometrics: Pre Biometrics - 01/16/17 1137      Pre Biometrics    Height  5' 11.5" (1.816 m)    Weight  228 lb 2.8 oz (103.5 kg)    Waist Circumference  44.75 inches    Hip Circumference  41 inches    Waist to Hip Ratio  1.09 %    BMI (Calculated)  31.4    Triceps Skinfold  23 mm    % Body Fat  32.2 %    Grip Strength  49 kg    Flexibility  8 in    Single Leg Stand  30 seconds      Post Biometrics - 04/25/17 0723       Post  Biometrics   Waist Circumference  43 inches    Hip Circumference  41 inches    Waist to Hip Ratio  1.05 %    Triceps Skinfold  17 mm    Grip Strength  50.5 kg    Flexibility  12 in    Single Leg Stand  30 seconds       Nutrition Therapy Plan and Nutrition Goals: Nutrition Therapy & Goals - 01/16/17 1558      Nutrition Therapy   Diet  Carb Modified, Therapeutic Lifestyle Changes      Personal Nutrition Goals   Nutrition Goal  Pt to identify and limit food sources of saturated fat, trans fat, and sodium    Personal Goal #2  Improved blood glucose control as evidenced by pt's A1c trending toward 7.0 or less.    Personal Goal #3  Pt to identify food quantities necessary to achieve weight loss of 6-24 lb (2.7-10.9 kg) at graduation from cardiac rehab. Goal wt of 200 lb desired.       Intervention Plan   Intervention  Prescribe, educate and counsel  regarding individualized specific dietary modifications aiming towards targeted core components such as weight, hypertension, lipid management, diabetes, heart failure and other comorbidities.    Expected Outcomes  Short Term Goal: Understand basic principles of dietary content, such as calories, fat, sodium, cholesterol and nutrients.;Long Term Goal: Adherence to prescribed nutrition plan.       Nutrition Discharge: Nutrition Scores: Nutrition Assessments - 01/16/17 1558      MEDFICTS Scores   Pre Score  52       Nutrition Goals Re-Evaluation:   Nutrition Goals Re-Evaluation:   Nutrition Goals Discharge (Final Nutrition Goals  Re-Evaluation):   Psychosocial: Target Goals: Acknowledge presence or absence of significant depression and/or stress, maximize coping skills, provide positive support system. Participant is able to verbalize types and ability to use techniques and skills needed for reducing stress and depression.  Initial Review & Psychosocial Screening: Initial Psych Review & Screening - 01/16/17 1430      Initial Review   Current issues with  None Identified      Family Dynamics   Good Support System?  Yes      Barriers   Psychosocial barriers to participate in program  The patient should benefit from training in stress management and relaxation.       Quality of Life Scores: Quality of Life - 05/04/17 1442      Quality of Life Scores   Health/Function Pre  27.2 %    Health/Function Post  24.8 %    Health/Function % Change  -8.82 %    Socioeconomic Pre  28.79 %    Socioeconomic Post  24 %    Socioeconomic % Change   -16.64 %    Psych/Spiritual Pre  27.43 %    Psych/Spiritual Post  27.43 %    Psych/Spiritual % Change  0 %    Family Pre  22.8 %    Family Post  24 %    Family % Change  5.26 %    GLOBAL Pre  26.93 %    GLOBAL Post  25.03 %    GLOBAL % Change  -7.06 %       PHQ-9: Recent Review Flowsheet Data    [facility] 02/12/2017 02/12/2017 01/24/2017 12/27/2016   Decreased Interest 0 0 0 0   Down, Depressed, Hopeless 0 0 0 0   PHQ - 2 Score 0 0 0 0     Interpretation of Total Score  Total Score Depression Severity:  1-4 = Minimal depression, 5-9 = Mild depression, 10-14 = Moderate depression, 15-19 = Moderately severe depression, 20-27 = Severe depression   Psychosocial Evaluation and Intervention: Psychosocial Evaluation - 05/01/17 1551      Psychosocial Evaluation & Interventions   Interventions  Encouraged to exercise with the program and follow exercise prescription;Stress management education    Comments  no psychosocial needs identified, no interventions  necessary     Expected Outcomes  pt will exhibit positive outlook with good coping skills.     Continue Psychosocial Services   No Follow up required       Psychosocial Re-Evaluation: Psychosocial Re-Evaluation    Row Name 02/12/17 0813 03/02/17 1117 03/06/17 0952 04/03/17 0741       Psychosocial Re-Evaluation   Current issues with  None Identified  None Identified  None Identified  None Identified    Comments  no psychosocial needs identified, no interventions necessary   no psychosocial needs identified, no interventions necessary   no psychosocial needs identified,  no interventions necessary   no psychosocial needs identified, no interventions necessary     Expected Outcomes  pt will exhibit positive outlook with good coping skills.   pt will exhibit positive outlook with good coping skills.   pt will exhibit positive outlook with good coping skills.   pt will exhibit positive outlook with good coping skills.     Interventions  Stress management education;Encouraged to attend Cardiac Rehabilitation for the exercise  Stress management education;Encouraged to attend Cardiac Rehabilitation for the exercise  Stress management education;Encouraged to attend Cardiac Rehabilitation for the exercise  Stress management education;Encouraged to attend Cardiac Rehabilitation for the exercise    Continue Psychosocial Services   No Follow up required  No Follow up required  No Follow up required  No Follow up required       Psychosocial Discharge (Final Psychosocial Re-Evaluation): Psychosocial Re-Evaluation - 04/03/17 0741      Psychosocial Re-Evaluation   Current issues with  None Identified    Comments  no psychosocial needs identified, no interventions necessary     Expected Outcomes  pt will exhibit positive outlook with good coping skills.     Interventions  Stress management education;Encouraged to attend Cardiac Rehabilitation for the exercise    Continue Psychosocial Services   No Follow up  required       Vocational Rehabilitation: Provide vocational rehab assistance to qualifying candidates.   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 Participant will state understanding/return demonstration of topics presented.  Learning Barriers/Preferences: Learning Barriers/Preferences - 01/16/17 1139      Learning Barriers/Preferences   Learning Barriers  None    Learning Preferences  Audio;Computer/Internet;Group Instruction;Individual Instruction;Pictoral;Skilled Demonstration;Verbal Instruction;Video;Written Material       Education Topics: Count Your Pulse:  -Group instruction provided by verbal instruction, demonstration, patient participation and written materials to support subject.  Instructors address importance of being able to find your pulse and how to count your pulse when at home without a heart monitor.  Patients get hands on experience counting their pulse with staff help and individually.   CARDIAC REHAB PHASE II EXERCISE from 02/12/2017 in [facility] CARDIAC REHAB  Date  02/09/17  Instruction Review Code  2- meets goals/outcomes      Heart Attack, Angina, and Risk Factor Modification:  -Group instruction provided by verbal instruction, video, and written materials to support subject.  Instructors address signs and symptoms of angina and heart attacks.    Also discuss risk factors for heart disease and how to make changes to improve heart health risk factors.   Functional Fitness:  -Group instruction provided by verbal instruction, demonstration, patient participation, and written materials to support subject.  Instructors  address safety measures for doing things around the house.  Discuss how to get up and down off the floor, how to pick things up properly, how to safely get out of a chair without assistance, and balance training.   Meditation and Mindfulness:  -Group instruction provided by verbal instruction, patient participation, and written materials to support subject.  Instructor addresses importance of mindfulness and meditation practice to help reduce stress and improve awareness.  Instructor also leads participants  through a meditation exercise.    Stretching for Flexibility and Mobility:  -Group instruction provided by verbal instruction, patient participation, and written materials to support subject.  Instructors lead participants through series of stretches that are designed to increase flexibility thus improving mobility.  These stretches are additional exercise for major muscle groups that are typically performed during regular warm up and cool down.   Hands Only CPR:  -Group verbal, video, and participation provides a basic overview of AHA guidelines for community CPR. Role-play of emergencies allow participants the opportunity to practice calling for help and chest compression technique with discussion of AED use.   Hypertension: -Group verbal and written instruction that provides a basic overview of hypertension including the most recent diagnostic guidelines, risk factor reduction with self-care instructions and medication management.    Nutrition I class: Heart Healthy Eating:  -Group instruction provided by PowerPoint slides, verbal discussion, and written materials to support subject matter. The instructor gives an explanation and review of the Therapeutic Lifestyle Changes diet recommendations, which includes a discussion on lipid goals, dietary fat, sodium, fiber, plant stanol/sterol esters, sugar, and the components of a well-balanced, healthy diet.   Nutrition II class: Lifestyle  Skills:  -Group instruction provided by PowerPoint slides, verbal discussion, and written materials to support subject matter. The instructor gives an explanation and review of label reading, grocery shopping for heart health, heart healthy recipe modifications, and ways to make healthier choices when eating out.   CARDIAC REHAB PHASE II EXERCISE from 02/12/2017 in [facility]  Date  02/13/17  Educator  RD  Instruction Review Code  2- meets goals/outcomes      Diabetes Question & Answer:  -Group instruction provided by PowerPoint slides, verbal discussion, and written materials to support subject matter. The instructor gives an explanation and review of diabetes co-morbidities, pre- and post-prandial blood glucose goals, pre-exercise blood glucose goals, signs, symptoms, and treatment of hypoglycemia and hyperglycemia, and foot care basics.   Diabetes Blitz:  -Group instruction provided by PowerPoint slides, verbal discussion, and written materials to support subject matter. The instructor gives an explanation and review of the physiology behind type 1 and type 2 diabetes, diabetes medications and rational behind using different medications, pre- and post-prandial blood glucose recommendations and Hemoglobin A1c goals, diabetes diet, and exercise including blood glucose guidelines for exercising safely.    Portion Distortion:  -Group instruction provided by PowerPoint slides, verbal discussion, written materials, and food models to support subject matter. The instructor gives an explanation of serving size versus portion size, changes in portions sizes over the last 20 years, and what consists of a serving from each food group.   Stress Management:  -Group instruction provided by verbal instruction, video, and written materials to support subject matter.  Instructors review role of stress in heart disease and how to cope with stress positively.     Exercising on  Your Own:  -Group instruction provided by verbal instruction, power point, and written materials to support subject.  Instructors discuss benefits of exercise, components of exercise, frequency and intensity of exercise, and end points for exercise.  Also discuss use of nitroglycerin and activating EMS.  Review options of places to exercise outside of rehab.  Review guidelines for sex with heart disease.   Cardiac Drugs I:  -Group instruction provided by verbal instruction and written materials to support subject.  Instructor reviews cardiac drug classes: antiplatelets, anticoagulants, beta blockers, and statins.  Instructor discusses reasons, side effects,  and lifestyle considerations for each drug class.   Cardiac Drugs II:  -Group instruction provided by verbal instruction and written materials to support subject.  Instructor reviews cardiac drug classes: angiotensin converting enzyme inhibitors (ACE-I), angiotensin II receptor blockers (ARBs), nitrates, and calcium channel blockers.  Instructor discusses reasons, side effects, and lifestyle considerations for each drug class.   Anatomy and Physiology of the Circulatory System:  Group verbal and written instruction and models provide basic cardiac anatomy and physiology, with the coronary electrical and arterial systems. Review of: AMI, Angina, Valve disease, Heart Failure, Peripheral Artery Disease, Cardiac Arrhythmia, Pacemakers, and the ICD.   Other Education:  -Group or individual verbal, written, or video instructions that support the educational goals of the cardiac rehab program.   Knowledge Questionnaire Score: Knowledge Questionnaire Score - 05/04/17 1441      Knowledge Questionnaire Score   Post Score  21/24       Core Components/Risk Factors/Patient Goals at Admission: Personal Goals and Risk Factors at Admission - 01/16/17 1232      Core Components/Risk Factors/Patient Goals on Admission    Weight Management   Yes;Obesity    Intervention  Weight Management/Obesity: Establish reasonable short term and long term weight goals.;Obesity: Provide education and appropriate resources to help participant work on and attain dietary goals.;Weight Management: Provide education and appropriate resources to help participant work on and attain dietary goals.;Weight Management: Develop a combined nutrition and exercise program designed to reach desired caloric intake, while maintaining appropriate intake of nutrient and fiber, sodium and fats, and appropriate energy expenditure required for the weight goal.    Admit Weight  228 lb 2.8 oz (103.5 kg)    Goal Weight: Short Term  222 lb 3.2 oz (100.8 kg)    Goal Weight: Long Term  203 lb 3.2 oz (92.2 kg)    Expected Outcomes  Short Term: Continue to assess and modify interventions until short term weight is achieved;Long Term: Adherence to nutrition and physical activity/exercise program aimed toward attainment of established weight goal;Weight Loss: Understanding of general recommendations for a balanced deficit meal plan, which promotes 1-2 lb weight loss per week and includes a negative energy balance of [phone] kcal/d    Diabetes  Yes    Intervention  Provide education about signs/symptoms and action to take for hypo/hyperglycemia.;Provide education about proper nutrition, including hydration, and aerobic/resistive exercise prescription along with prescribed medications to achieve blood glucose in normal ranges: Fasting glucose 65-99 mg/dL    Expected Outcomes  Short Term: Participant verbalizes understanding of the signs/symptoms and immediate care of hyper/hypoglycemia, proper foot care and importance of medication, aerobic/resistive exercise and nutrition plan for blood glucose control.;Long Term: Attainment of HbA1C < 7%.    Hypertension  Yes    Intervention  Provide education on lifestyle modifcations including regular physical activity/exercise, weight management,  moderate sodium restriction and increased consumption of fresh fruit, vegetables, and low fat dairy, alcohol moderation, and smoking cessation.;Monitor prescription use compliance.    Expected Outcomes  Short Term: Continued assessment and intervention until BP is < 140/44mm HG in hypertensive participants. < 130/86mm HG in hypertensive participants with diabetes, heart failure or chronic kidney disease.;Long Term: Maintenance of blood pressure at goal levels.    Lipids  Yes    Intervention  Provide education and support for participant on nutrition & aerobic/resistive exercise along with prescribed medications to achieve LDL 70mg , HDL >40mg .    Expected Outcomes  Short Term: Participant states understanding of desired cholesterol values and  is compliant with medications prescribed. Participant is following exercise prescription and nutrition guidelines.;Long Term: Cholesterol controlled with medications as prescribed, with individualized exercise RX and with personalized nutrition plan. Value goals: LDL < 70mg , HDL > 40 mg.    Personal Goal Other  Yes    Personal Goal  Get back to normal self.    Intervention  Provide exercise routine to help improve energy, strength, and stamina, so that patient can return to previous activities.    Expected Outcomes  Patient will resume normal activites and "get back to normal self" as eveidenced by patient personal statement.       Core Components/Risk Factors/Patient Goals Review:  Goals and Risk Factor Review    Row Name 02/12/17 1308 03/02/17 1116 03/06/17 0950 04/03/17 0741 05/01/17 1551     Core Components/Risk Factors/Patient Goals Review   Personal Goals Review  Weight Management/Obesity;Diabetes;Hypertension;Lipids;Other  Weight Management/Obesity  Weight Management/Obesity  Weight Management/Obesity  Weight Management/Obesity   Review  pt with muliple CAD RF demonstrates eagerness to participate in CR activities.  pt with muliple CAD RF demonstrates  eagerness to participate in CR activities.  pt with muliple CAD RF demonstrates eagerness to participate in CR activities. pt using Treadmill for home exercise and is looking forward to resuming resistence training.    pt with muliple CAD RF demonstrates eagerness to participate in CR activities. pt using Treadmill for home exercise and is looking forward to resuming resistence training.    pt with muliple CAD RF demonstrates eagerness to participate in CR activities. pt using Treadmill for home exercise and is looking forward to resuming resistence training.     Expected Outcomes  pt will participate in CR exercise, nutrition and lifestyle modification education to decrease overall RF.   pt will participate in CR exercise, nutrition and lifestyle modification education to decrease overall RF.   pt will participate in CR exercise, nutrition and lifestyle modification education to decrease overall RF.   pt will participate in CR exercise, nutrition and lifestyle modification education to decrease overall RF.   pt will participate in CR exercise, nutrition and lifestyle modification education to decrease overall RF.       Core Components/Risk Factors/Patient Goals at Discharge (Final Review):  Goals and Risk Factor Review - 05/01/17 1551      Core Components/Risk Factors/Patient Goals Review   Personal Goals Review  Weight Management/Obesity    Review  pt with muliple CAD RF demonstrates eagerness to participate in CR activities. pt using Treadmill for home exercise and is looking forward to resuming resistence training.      Expected Outcomes  pt will participate in CR exercise, nutrition and lifestyle modification education to decrease overall RF.        ITP Comments: ITP Comments    Row Name 01/16/17 0950 01/17/17 1642 02/14/17 0821 03/13/17 1520 04/03/17 0741   ITP Comments  Dr. Armanda Magic, Medical Director   Dr. Armanda Magic, Medical Director   Dr. Armanda Magic, Medical Director   30 day ITP  review.  Pt with good participation and attendance.  No change in current regimen unless directed by Medical Director.    30 day ITP review.  Pt with good participation and attendance.  No change in current regimen unless directed by Medical Director.     Row Name 05/01/17 1551           ITP Comments  30 day ITP review.  Pt with good participation and attendance.  No change in  current regimen unless directed by Medical Director.            Comments:

## 2017-05-07 ENCOUNTER — Encounter (HOSPITAL_COMMUNITY)
Admission: RE | Admit: 2017-05-07 | Discharge: 2017-05-07 | Disposition: A | Discharge status: Home / Self Care | Payer: Federal, State, Local not specified - PPO | Source: Ambulatory Visit | Attending: Cardiology | Admitting: Cardiology

## 2017-05-07 DIAGNOSIS — I214 Non-ST elevation (NSTEMI) myocardial infarction: Secondary | ICD-10-CM | POA: Diagnosis not present

## 2017-05-07 DIAGNOSIS — Z955 Presence of coronary angioplasty implant and graft: Secondary | ICD-10-CM | POA: Diagnosis not present

## 2017-05-07 DIAGNOSIS — E119 Type 2 diabetes mellitus without complications: Secondary | ICD-10-CM | POA: Diagnosis not present

## 2017-05-07 NOTE — Addendum Note (Signed)
Encounter addended by: Jacques EarthlyFranko, Ashika Apuzzo Brewbaker, RD on: 05/07/2017 9:04 AM  Actions taken: Flowsheet data copied forward, Visit Navigator Flowsheet section accepted

## 2017-05-09 ENCOUNTER — Encounter (HOSPITAL_COMMUNITY)
Admission: RE | Admit: 2017-05-09 | Discharge: 2017-05-09 | Disposition: A | Discharge status: Home / Self Care | Payer: Federal, State, Local not specified - PPO | Source: Ambulatory Visit | Attending: Cardiology | Admitting: Cardiology

## 2017-05-09 DIAGNOSIS — I214 Non-ST elevation (NSTEMI) myocardial infarction: Secondary | ICD-10-CM

## 2017-05-09 DIAGNOSIS — Z955 Presence of coronary angioplasty implant and graft: Secondary | ICD-10-CM

## 2017-06-04 NOTE — Progress Notes (Signed)
Discharge Progress Report  Patient Details  Name: Connor Reed MRN: [id number] Date of Birth: 1960-08-21 Referring Provider:     CARDIAC REHAB PHASE II ORIENTATION from 01/16/2017 in [place]  Referring Provider  Candee Furbish, MD.       Number of Visits: 54  Reason for Discharge:  Patient has met program and personal goals.  Smoking History:  Social History   Tobacco Use  Smoking Status Former Smoker  . Packs/day: 1.00  . Years: 20.00  . Pack years: 20.00  . Last attempt to quit: 06/19/1990  . Years since quitting: 26.9  Smokeless Tobacco Never Used    Diagnosis:  NSTEMI (non-ST elevated myocardial infarction) ([place])  Stented coronary artery  ADL UCSD:   Initial Exercise Prescription: Initial Exercise Prescription - 01/16/17 1100      Date of Initial Exercise RX and Referring Provider   Date  01/16/17    Referring Provider  Candee Furbish, MD.      Treadmill   MPH  3.4    Grade  0    Minutes  10    METs  3.6      Bike   Level  1.7    Minutes  10    METs  4.13      NuStep   Level  4    SPM  95    Minutes  10    METs  3.3      Prescription Details   Frequency (times per week)  3    Duration  Progress to 30 minutes of continuous aerobic without signs/symptoms of physical distress      Intensity   THRR 40-80% of Max Heartrate  65-131    Ratings of Perceived Exertion  11-13    Perceived Dyspnea  0-4      Progression   Progression  Continue to progress workloads to maintain intensity without signs/symptoms of physical distress.      Resistance Training   Training Prescription  Yes    Weight  4lbs    Reps  10-15       Discharge Exercise Prescription (Final Exercise Prescription Changes): Exercise Prescription Changes - 05/09/17 1516      Response to Exercise   Blood Pressure (Admit)  110/70    Blood Pressure (Exercise)  160/80    Blood Pressure (Exit)  112/80    Heart Rate (Admit)  70 bpm    Heart Rate  (Exercise)  84 bpm    Heart Rate (Exit)  80 bpm    Rating of Perceived Exertion (Exercise)  12    Symptoms  none    Duration  Continue with 30 min of aerobic exercise without signs/symptoms of physical distress.    Intensity  THRR unchanged      Progression   Progression  Continue to progress workloads to maintain intensity without signs/symptoms of physical distress.    Average METs  5.2      Resistance Training   Training Prescription  No      Interval Training   Interval Training  No      Treadmill   MPH  3.8    Grade  4    Minutes  10    METs  6      Bike   Level  2    Minutes  10    METs  4.66      NuStep   Level  6    SPM  95    Minutes  10    METs  4.9      Home Exercise Plan   Plans to continue exercise at  Home (comment)    Frequency  Add 3 additional days to program exercise sessions.    Initial Home Exercises Provided  03/01/17       Functional Capacity: 6 Minute Walk    Row Name 01/16/17 1134 04/25/17 0723 05/11/17 1529     6 Minute Walk   Phase  Initial  Discharge  Initial   Distance  1636 feet  1988 feet  -   Distance % Change  -  21.52 %  -   Walk Time  6 minutes  6 minutes  -   # of Rest Breaks  0  0  -   MPH  3.1  3.77  -   METS  4.04  4.81  -   RPE  11  7  -   VO2 Peak  14.13  16.83  -   Symptoms  No  No  -   Resting HR  73 bpm  75 bpm  -   Resting BP  100/60  102/80  -   Max Ex. HR  120 bpm  126 bpm  -   Max Ex. BP  112/60  122/82  -   2 Minute Post BP  110/62  122/84  -   Row Name 05/11/17 1630         6 Minute Walk   Phase  Initial        Psychological, QOL, Others - Outcomes: PHQ 2/9: Depression screen [facility] [place] 2/9 05/09/2017 02/12/2017 02/12/2017 01/24/2017 12/27/2016  Decreased Interest 0 0 0 0 0  Down, Depressed, Hopeless 0 0 0 0 0  PHQ - 2 Score 0 0 0 0 0    Quality of Life: Quality of Life - 05/04/17 1442      Quality of Life Scores   Health/Function Pre  27.2 %    Health/Function Post  24.8 %    Health/Function %  Change  -8.82 %    Socioeconomic Pre  28.79 %    Socioeconomic Post  24 %    Socioeconomic % Change   -16.64 %    Psych/Spiritual Pre  27.43 %    Psych/Spiritual Post  27.43 %    Psych/Spiritual % Change  0 %    Family Pre  22.8 %    Family Post  24 %    Family % Change  5.26 %    GLOBAL Pre  26.93 %    GLOBAL Post  25.03 %    GLOBAL % Change  -7.06 %       Personal Goals: Goals established at orientation with interventions provided to work toward goal. Personal Goals and Risk Factors at Admission - 01/16/17 1232      Core Components/Risk Factors/Patient Goals on Admission    Weight Management  Yes;Obesity    Intervention  Weight Management/Obesity: Establish reasonable short term and long term weight goals.;Obesity: Provide education and appropriate resources to help participant work on and attain dietary goals.;Weight Management: Provide education and appropriate resources to help participant work on and attain dietary goals.;Weight Management: Develop a combined nutrition and exercise program designed to reach desired caloric intake, while maintaining appropriate intake of nutrient and fiber, sodium and fats, and appropriate energy expenditure required for the weight goal.    Admit Weight  228 lb 2.8 oz (103.5 kg)  Goal Weight: Short Term  222 lb 3.2 oz (100.8 kg)    Goal Weight: Long Term  203 lb 3.2 oz (92.2 kg)    Expected Outcomes  Short Term: Continue to assess and modify interventions until short term weight is achieved;Long Term: Adherence to nutrition and physical activity/exercise program aimed toward attainment of established weight goal;Weight Loss: Understanding of general recommendations for a balanced deficit meal plan, which promotes 1-2 lb weight loss per week and includes a negative energy balance of [phone] kcal/d    Diabetes  Yes    Intervention  Provide education about signs/symptoms and action to take for hypo/hyperglycemia.;Provide education about proper  nutrition, including hydration, and aerobic/resistive exercise prescription along with prescribed medications to achieve blood glucose in normal ranges: Fasting glucose 65-99 mg/dL    Expected Outcomes  Short Term: Participant verbalizes understanding of the signs/symptoms and immediate care of hyper/hypoglycemia, proper foot care and importance of medication, aerobic/resistive exercise and nutrition plan for blood glucose control.;Long Term: Attainment of HbA1C < 7%.    Hypertension  Yes    Intervention  Provide education on lifestyle modifcations including regular physical activity/exercise, weight management, moderate sodium restriction and increased consumption of fresh fruit, vegetables, and low fat dairy, alcohol moderation, and smoking cessation.;Monitor prescription use compliance.    Expected Outcomes  Short Term: Continued assessment and intervention until BP is < 140/64m HG in hypertensive participants. < 130/875mHG in hypertensive participants with diabetes, heart failure or chronic kidney disease.;Long Term: Maintenance of blood pressure at goal levels.    Lipids  Yes    Intervention  Provide education and support for participant on nutrition & aerobic/resistive exercise along with prescribed medications to achieve LDL '70mg'$ , HDL >'40mg'$ .    Expected Outcomes  Short Term: Participant states understanding of desired cholesterol values and is compliant with medications prescribed. Participant is following exercise prescription and nutrition guidelines.;Long Term: Cholesterol controlled with medications as prescribed, with individualized exercise RX and with personalized nutrition plan. Value goals: LDL < '70mg'$ , HDL > 40 mg.    Personal Goal Other  Yes    Personal Goal  Get back to normal self.    Intervention  Provide exercise routine to help improve energy, strength, and stamina, so that patient can return to previous activities.    Expected Outcomes  Patient will resume normal activites and  "get back to normal self" as eveidenced by patient personal statement.        Personal Goals Discharge: Goals and Risk Factor Review    Row Name 02/12/17 0837109/28/18 1116 03/06/17 0950 04/03/17 0741 05/01/17 1551     Core Components/Risk Factors/Patient Goals Review   Personal Goals Review  Weight Management/Obesity;Diabetes;Hypertension;Lipids;Other  Weight Management/Obesity  Weight Management/Obesity  Weight Management/Obesity  Weight Management/Obesity   Review  pt with muliple CAD RF demonstrates eagerness to participate in CR activities.  pt with muliple CAD RF demonstrates eagerness to participate in CR activities.  pt with muliple CAD RF demonstrates eagerness to participate in CR activities. pt using Treadmill for home exercise and is looking forward to resuming resistence training.    pt with muliple CAD RF demonstrates eagerness to participate in CR activities. pt using Treadmill for home exercise and is looking forward to resuming resistence training.    pt with muliple CAD RF demonstrates eagerness to participate in CR activities. pt using Treadmill for home exercise and is looking forward to resuming resistence training.     Expected Outcomes  pt will participate in CR  exercise, nutrition and lifestyle modification education to decrease overall RF.   pt will participate in CR exercise, nutrition and lifestyle modification education to decrease overall RF.   pt will participate in CR exercise, nutrition and lifestyle modification education to decrease overall RF.   pt will participate in CR exercise, nutrition and lifestyle modification education to decrease overall RF.   pt will participate in CR exercise, nutrition and lifestyle modification education to decrease overall RF.    [place] Name 05/09/17 1018             Core Components/Risk Factors/Patient Goals Review   Personal Goals Review  Weight Management/Obesity       Review  pt using Treadmill for home exercise ans has enjoyed  resuming  resistence training.  pt encouraged with his current weight loss however feels he has not met his personal weight loss goals. pt encouraged to continue  weight loss efforts to  meet this goal.        Expected Outcomes  pt will participate in home exercise, nutrition and lifestyle modification to decrease overall RF.           Exercise Goals and Review: Exercise Goals    Row Name 01/16/17 1135             Exercise Goals   Increase Physical Activity  Yes       Intervention  Provide advice, education, support and counseling about physical activity/exercise needs.;Develop an individualized exercise prescription for aerobic and resistive training based on initial evaluation findings, risk stratification, comorbidities and participant's personal goals.       Expected Outcomes  Achievement of increased cardiorespiratory fitness and enhanced flexibility, muscular endurance and strength shown through measurements of functional capacity and personal statement of participant.       Increase Strength and Stamina  Yes       Intervention  Provide advice, education, support and counseling about physical activity/exercise needs.;Develop an individualized exercise prescription for aerobic and resistive training based on initial evaluation findings, risk stratification, comorbidities and participant's personal goals.       Expected Outcomes  Achievement of increased cardiorespiratory fitness and enhanced flexibility, muscular endurance and strength shown through measurements of functional capacity and personal statement of participant.          Nutrition & Weight - Outcomes: Pre Biometrics - 01/16/17 1137      Pre Biometrics   Height  5' 11.5" (1.816 m)    Weight  228 lb 2.8 oz (103.5 kg)    Waist Circumference  44.75 inches    Hip Circumference  41 inches    Waist to Hip Ratio  1.09 %    BMI (Calculated)  31.4    Triceps Skinfold  23 mm    % Body Fat  32.2 %    Grip Strength  49 kg     Flexibility  8 in    Single Leg Stand  30 seconds      Post Biometrics - 04/25/17 0723       Post  Biometrics   Waist Circumference  43 inches    Hip Circumference  41 inches    Waist to Hip Ratio  1.05 %    Triceps Skinfold  17 mm    Grip Strength  50.5 kg    Flexibility  12 in    Single Leg Stand  30 seconds       Nutrition: Nutrition Therapy & Goals - 01/16/17 1558  Nutrition Therapy   Diet  Carb Modified, Therapeutic Lifestyle Changes      Personal Nutrition Goals   Nutrition Goal  Pt to identify and limit food sources of saturated fat, trans fat, and sodium    Personal Goal #2  Improved blood glucose control as evidenced by pt's A1c trending toward 7.0 or less.    Personal Goal #3  Pt to identify food quantities necessary to achieve weight loss of 6-24 lb (2.7-10.9 kg) at graduation from cardiac rehab. Goal wt of 200 lb desired.       Intervention Plan   Intervention  Prescribe, educate and counsel regarding individualized specific dietary modifications aiming towards targeted core components such as weight, hypertension, lipid management, diabetes, heart failure and other comorbidities.    Expected Outcomes  Short Term Goal: Understand basic principles of dietary content, such as calories, fat, sodium, cholesterol and nutrients.;Long Term Goal: Adherence to prescribed nutrition plan.       Nutrition Discharge: Nutrition Assessments - 05/07/17 0904      MEDFICTS Scores   Pre Score  52    Post Score  36    Score Difference  -16       Education Questionnaire Score: Knowledge Questionnaire Score - 05/04/17 1441      Knowledge Questionnaire Score   Post Score  21/24       Goals reviewed with patient; copy given to patient.

## 2017-06-04 NOTE — Addendum Note (Signed)
Encounter addended by: Robyne Peersion, Joann H, RN on: 06/04/2017 8:06 AM  Actions taken: Episode resolved

## 2017-06-04 NOTE — Addendum Note (Signed)
Encounter addended by: Robyne Peersion, Bassem Bernasconi H, RN on: 06/04/2017 8:05 AM  Actions taken: Sign clinical note

## 2017-10-03 ENCOUNTER — Telehealth: Payer: Self-pay | Admitting: *Deleted

## 2017-10-03 MED ORDER — ALBUTEROL SULFATE HFA 108 (90 BASE) MCG/ACT IN AERS: 2.0000 | INHALATION_SPRAY | RESPIRATORY_TRACT | 2 refills | Status: DC | PRN

## 2017-10-03 NOTE — Telephone Encounter (Signed)
Received fax from [facility] [phone]) requesting to change Proair HFA to generic to lower cost to patient. Will route to PCP for consideration. Kinnie Feil, RN, BSN

## 2017-11-21 ENCOUNTER — Encounter: Payer: Self-pay | Admitting: *Deleted

## 2018-01-12 ENCOUNTER — Other Ambulatory Visit: Payer: Self-pay | Admitting: Internal Medicine

## 2018-01-12 DIAGNOSIS — E1100 Type 2 diabetes mellitus with hyperosmolarity without nonketotic hyperglycemic-hyperosmolar coma (NKHHC): Secondary | ICD-10-CM

## 2018-01-14 ENCOUNTER — Other Ambulatory Visit: Payer: Self-pay | Admitting: Internal Medicine

## 2018-01-14 DIAGNOSIS — I214 Non-ST elevation (NSTEMI) myocardial infarction: Secondary | ICD-10-CM

## 2018-01-15 NOTE — Telephone Encounter (Signed)
Called pt to see if he wanted to go ahead and schedule an appt with his doctor since it will be a year next month since last appt. Pt is agreeable ; transfer call to front office but Dr Sylvester HarderKrienke's schedule wass not completely posted yet. Will ask pt to call back.

## 2018-03-04 ENCOUNTER — Other Ambulatory Visit: Payer: Self-pay

## 2018-03-04 ENCOUNTER — Ambulatory Visit: Payer: Federal, State, Local not specified - PPO | Admitting: Internal Medicine

## 2018-03-04 ENCOUNTER — Encounter (INDEPENDENT_AMBULATORY_CARE_PROVIDER_SITE_OTHER): Payer: Self-pay

## 2018-03-04 ENCOUNTER — Ambulatory Visit (HOSPITAL_COMMUNITY)
Admission: RE | Admit: 2018-03-04 | Discharge: 2018-03-04 | Disposition: A | Discharge status: Home / Self Care | Payer: Federal, State, Local not specified - PPO | Source: Ambulatory Visit | Attending: Internal Medicine | Admitting: Internal Medicine

## 2018-03-04 ENCOUNTER — Encounter: Payer: Self-pay | Admitting: Internal Medicine

## 2018-03-04 VITALS — BP 144/96 | HR 59 | Temp 98.1°F | Ht 72.0 in | Wt 240.7 lb

## 2018-03-04 DIAGNOSIS — Z791 Long term (current) use of non-steroidal anti-inflammatories (NSAID): Secondary | ICD-10-CM

## 2018-03-04 DIAGNOSIS — J42 Unspecified chronic bronchitis: Secondary | ICD-10-CM

## 2018-03-04 DIAGNOSIS — Z79899 Other long term (current) drug therapy: Secondary | ICD-10-CM

## 2018-03-04 DIAGNOSIS — G8929 Other chronic pain: Secondary | ICD-10-CM | POA: Diagnosis not present

## 2018-03-04 DIAGNOSIS — M25562 Pain in left knee: Secondary | ICD-10-CM

## 2018-03-04 DIAGNOSIS — I10 Essential (primary) hypertension: Secondary | ICD-10-CM | POA: Diagnosis not present

## 2018-03-04 DIAGNOSIS — M25462 Effusion, left knee: Secondary | ICD-10-CM | POA: Insufficient documentation

## 2018-03-04 DIAGNOSIS — I252 Old myocardial infarction: Secondary | ICD-10-CM

## 2018-03-04 DIAGNOSIS — I214 Non-ST elevation (NSTEMI) myocardial infarction: Secondary | ICD-10-CM | POA: Diagnosis not present

## 2018-03-04 DIAGNOSIS — E1100 Type 2 diabetes mellitus with hyperosmolarity without nonketotic hyperglycemic-hyperosmolar coma (NKHHC): Secondary | ICD-10-CM

## 2018-03-04 DIAGNOSIS — Z7984 Long term (current) use of oral hypoglycemic drugs: Secondary | ICD-10-CM

## 2018-03-04 DIAGNOSIS — M1712 Unilateral primary osteoarthritis, left knee: Secondary | ICD-10-CM | POA: Diagnosis not present

## 2018-03-04 DIAGNOSIS — Z7902 Long term (current) use of antithrombotics/antiplatelets: Secondary | ICD-10-CM

## 2018-03-04 DIAGNOSIS — Z87448 Personal history of other diseases of urinary system: Secondary | ICD-10-CM

## 2018-03-04 DIAGNOSIS — Z7982 Long term (current) use of aspirin: Secondary | ICD-10-CM

## 2018-03-04 DIAGNOSIS — E119 Type 2 diabetes mellitus without complications: Secondary | ICD-10-CM

## 2018-03-04 DIAGNOSIS — Z905 Acquired absence of kidney: Secondary | ICD-10-CM

## 2018-03-04 LAB — POCT GLYCOSYLATED HEMOGLOBIN (HGB A1C): HEMOGLOBIN A1C: 7.2 % — AB (ref 4.0–5.6)

## 2018-03-04 LAB — GLUCOSE, CAPILLARY: GLUCOSE-CAPILLARY: 149 mg/dL — AB (ref 70–99)

## 2018-03-04 MED ORDER — CANAGLIFLOZIN 100 MG PO TABS: 100.0000 mg | ORAL_TABLET | Freq: Every day | ORAL | 2 refills | Status: DC

## 2018-03-04 MED ORDER — ASPIRIN 81 MG PO CHEW: CHEWABLE_TABLET | ORAL | 0 refills | Status: DC

## 2018-03-04 MED ORDER — ATORVASTATIN CALCIUM 40 MG PO TABS: 40.0000 mg | ORAL_TABLET | Freq: Every day | ORAL | 0 refills | Status: DC

## 2018-03-04 MED ORDER — ALBUTEROL SULFATE HFA 108 (90 BASE) MCG/ACT IN AERS: 2.0000 | INHALATION_SPRAY | RESPIRATORY_TRACT | 2 refills | Status: DC | PRN

## 2018-03-04 MED ORDER — ONETOUCH DELICA LANCETS FINE MISC: 3 refills | Status: AC

## 2018-03-04 MED ORDER — DICLOFENAC SODIUM 1 % TD GEL: 4.0000 g | Freq: Four times a day (QID) | TRANSDERMAL | 1 refills | Status: DC

## 2018-03-04 MED ORDER — GLUCOSE BLOOD VI STRP: ORAL_STRIP | 3 refills | Status: DC

## 2018-03-04 MED ORDER — FLUTICASONE-SALMETEROL 100-50 MCG/DOSE IN AEPB: 1.0000 | INHALATION_SPRAY | Freq: Two times a day (BID) | RESPIRATORY_TRACT | 3 refills | Status: DC

## 2018-03-04 MED ORDER — NITROGLYCERIN 0.4 MG SL SUBL: 0.4000 mg | SUBLINGUAL_TABLET | SUBLINGUAL | 3 refills | Status: DC | PRN

## 2018-03-04 NOTE — Assessment & Plan Note (Addendum)
Assessment: Connor Reed was on metformin 500 mg BID but has been out of all of his medications for several months. Today's A1c is much improved at 7.2 (down from 12/17/16 @ 10.3). Given his concern for metformin's renal toxicity, I will switch his glycemic control medication to Invokana 100 mg QD for its blood sugar control but also for cardiovascular protection.   Plan: 1. Discontinued metformin 2. Start Invokana 100 mg QD 3. Refilled test strips and lancets

## 2018-03-04 NOTE — Progress Notes (Signed)
   CC: Left Knee Pain and medication refill  HPI:  Connor Reed is a 57 y.o. male with T2DM, HTN, chronic bronchitis, and history of NSTEMI (2018) and nephrectomy after infection when he was 57 years old who presents for left knee pain.  Knee Pain [name] has had a 3-4 month history of progressive left knee pain. He also has intermittent left leg swelling. He denies locking of the joint. He denies any recent trauma. He used to work doing Publishing copy work and believes that the hard labor from that could have contributed to his pain. He now works as the [employer]. He has been taking Ibuprofen 400 mg three times a day, most days with little alleviation of pain. He is also wearing a knee brace which somewhat helps.   T2DM He is currently on metformin 500 mg BID but has been out of all of his medications for several months. Last A1c 12/17/16 10.3. His capillary glucose last checked 03/12/17 134. He is concerned about taking Metformin because of the potential renal toxicity.   Chronic Bronchitis He usually takes Albuterol and Advair as needed for his respiratory symptoms but has been out of these medications for several months. He has not had any flare ups of his chronic bronchitis since then.   History of NSTEMI (2018) He is prescribed atorvastatin 40 mg QD, aspirin 81 mg QD, and clopidogrel 75 mg QD but has been out of these medications for several months. He is also taking nitroglycerin as needed for chest pain but has not had to use this since his NSTEMI.  Past Medical History:  Diagnosis Date  . Asthma   . Chronic bronchitis (HCC)   . Hypertension    Review of Systems:   Review of Systems  Constitutional: Negative for chills and fever.  HENT: Negative for congestion and sinus pain.   Respiratory: Negative for cough, sputum production and shortness of breath.   Cardiovascular: Negative for chest pain, palpitations and leg swelling.  Gastrointestinal: Negative  for abdominal pain, constipation, diarrhea, nausea and vomiting.  Genitourinary: Negative for dysuria and hematuria.  Musculoskeletal:       Left knee pain and swelling  Neurological: Negative for dizziness, weakness and headaches.  All other systems reviewed and are negative.   Physical Exam:  Vitals:   03/04/18 0901  BP: (!) 144/96  Pulse: (!) 59  Temp: 98.1 F (36.7 C)  TempSrc: Oral  SpO2: 97%  Weight: 240 lb 11.2 oz (109.2 kg)  Height: 6' (1.829 m)   Physical Exam  Constitutional: He appears well-developed and well-nourished.  HENT:  Head: Normocephalic and atraumatic.  Eyes: EOM are normal.  Neck: Normal range of motion.  Cardiovascular: Regular rhythm and normal heart sounds.  Pulmonary/Chest: Effort normal and breath sounds normal.  Abdominal: Soft. Bowel sounds are normal.  Musculoskeletal: Normal range of motion.  No LE edema bilaterally. No swelling noted at either knee. Mild crepitus in both knees palpated while flexing each knee. No clear deformities noted in either knee. No tenderness to valgus or varus maneuvers of the knees bilaterally.  Neurological: He is alert.  Skin: Skin is warm.  Nursing note and vitals reviewed.   Assessment & Plan:   See Encounters Tab for problem based charting.  Patient seen with Dr. Criselda Peaches

## 2018-03-04 NOTE — Assessment & Plan Note (Signed)
Assessment: Connor Reed presents with a 3-4 month history of left knee pain which has not been relieved by oral NSAIDs. His left knee x-ray showed bilateral proximal tibial exostoses medially (larger on right) and mild degenerative changes of left knee with associated knee joint effusion. I have prescribed his diclofenac gel which has been shown to alleviate osteoarthritic knee pain without systemic risk factors (GI ulcer, renal toxicity). He has an appointment with his orthopedic surgeon who repaired his rotator cuff next Monday. I will have the X-ray results faxed over to their office for further recommendations.  Plan: 1. Prescribed diclofenac gel and recommended to discontinue use of oral NSAID 2. Fax X-ray results to Dr. Charolotte Eke at [facility] in [place]

## 2018-03-04 NOTE — Assessment & Plan Note (Addendum)
Assessment: He is prescribed atorvastatin 40 mg QD, aspirin 81 mg QD, and clopidogrel 75 mg QD but has been out of these medications for several months. He is also taking nitroglycerin as needed for chest pain but has not had to use this since his NSTEMI.  Plan: 1. Continue atorvastatin 40 mg QD-- Refilled today 2. Continue aspirin 81 mg QD-- Refilled today 3. Discontinued clopidogrel 75 mg QD (only needed to take for 1 year after date of NSTEMI) 4. Continue Nitroglycerin 0.4 mg sublingual PRN chest pain-- Refilled today 5. Lipid panel performed today to evaluate statin efficacy

## 2018-03-04 NOTE — Patient Instructions (Signed)
I will call you with the results of your X-ray and blood work.

## 2018-03-04 NOTE — Assessment & Plan Note (Addendum)
Assessment: Connor Reed has a history of unilateral nephrectomy after renal infection as a child. It is essential to preserve the function of his one remaining kidney and I will thus check his renal function with BMP today. I am concerned with his NSAID overuse and uncontrolled diabetes.  Plan: 1. Ordered BMP to evaluate kidney function 2. Recommended discontinuation of NSAID and adherence to glycemic control medication.

## 2018-03-04 NOTE — Assessment & Plan Note (Signed)
Assessment: He usually takes Albuterol and Advair as needed for his respiratory symptoms but has been out of these medications for several months. He has not had any flare ups of his chronic bronchitis since then.   Plan: 1. Refill Albuterol  2. Refill Advair

## 2018-03-05 ENCOUNTER — Telehealth: Payer: Self-pay | Admitting: *Deleted

## 2018-03-05 LAB — LIPID PANEL
CHOLESTEROL TOTAL: 147 mg/dL (ref 100–199)
Chol/HDL Ratio: 3.6 ratio (ref 0.0–5.0)
HDL: 41 mg/dL (ref >39)
LDL Calculated: 85 mg/dL (ref 0–99)
Triglycerides: 106 mg/dL (ref 0–149)
VLDL Cholesterol Cal: 21 mg/dL (ref 5–40)

## 2018-03-05 LAB — BMP8+ANION GAP
Anion Gap: 15 mmol/L (ref 10.0–18.0)
BUN/Creatinine Ratio: 17 (ref 9–20)
BUN: 15 mg/dL (ref 6–24)
CALCIUM: 9.5 mg/dL (ref 8.7–10.2)
CHLORIDE: 100 mmol/L (ref 96–106)
CO2: 23 mmol/L (ref 20–29)
Creatinine, Ser: 0.86 mg/dL (ref 0.76–1.27)
GFR calc Af Amer: 110 mL/min/{1.73_m2} (ref >59)
GFR calc non Af Amer: 96 mL/min/{1.73_m2} (ref >59)
GLUCOSE: 162 mg/dL — AB (ref 65–99)
Potassium: 4.9 mmol/L (ref 3.5–5.2)
Sodium: 138 mmol/L (ref 134–144)

## 2018-03-05 NOTE — Telephone Encounter (Signed)
Call to [facility] for PA for Invokana.  Not on Formulary-patient can receive Jardiance, Farxiga, Synjardy, Synjardy XR, and Xigduoxr x 2 refill for free. Message to be sent to Dr. Criss Alvine and Dr. Criselda Peaches.  Awaiting decision from ordering physicians.  Angelina Ok, RN 03/05/2018

## 2018-03-06 NOTE — Progress Notes (Signed)
Internal Medicine Clinic Attending  I saw and evaluated the patient.  I personally confirmed the key portions of the history and exam documented by Dr. Prince and I reviewed pertinent patient test results.  The assessment, diagnosis, and plan were formulated together and I agree with the documentation in the resident's note.  

## 2018-03-08 ENCOUNTER — Other Ambulatory Visit: Payer: Self-pay | Admitting: Internal Medicine

## 2018-03-08 DIAGNOSIS — E1100 Type 2 diabetes mellitus with hyperosmolarity without nonketotic hyperglycemic-hyperosmolar coma (NKHHC): Secondary | ICD-10-CM

## 2018-03-08 MED ORDER — EMPAGLIFLOZIN 10 MG PO TABS: 10.0000 mg | ORAL_TABLET | Freq: Every day | ORAL | 2 refills | Status: DC

## 2018-03-08 NOTE — Progress Notes (Signed)
Insurance did not pay for Invokana. I will send in a prescription for Jardiance 10 mg PO QD.

## 2018-03-11 DIAGNOSIS — M1712 Unilateral primary osteoarthritis, left knee: Secondary | ICD-10-CM | POA: Diagnosis not present

## 2018-03-11 DIAGNOSIS — M25562 Pain in left knee: Secondary | ICD-10-CM | POA: Diagnosis not present

## 2018-03-11 DIAGNOSIS — R52 Pain, unspecified: Secondary | ICD-10-CM | POA: Diagnosis not present

## 2018-04-11 ENCOUNTER — Telehealth: Payer: Self-pay | Admitting: Internal Medicine

## 2018-06-13 ENCOUNTER — Ambulatory Visit: Payer: Federal, State, Local not specified - PPO | Admitting: Pharmacist

## 2018-06-18 ENCOUNTER — Ambulatory Visit: Payer: Federal, State, Local not specified - PPO | Admitting: Pharmacist

## 2018-06-25 ENCOUNTER — Ambulatory Visit (INDEPENDENT_AMBULATORY_CARE_PROVIDER_SITE_OTHER): Payer: Federal, State, Local not specified - PPO | Admitting: Pharmacist

## 2018-06-25 ENCOUNTER — Other Ambulatory Visit: Payer: Self-pay | Admitting: Pharmacist

## 2018-06-25 DIAGNOSIS — Z23 Encounter for immunization: Secondary | ICD-10-CM

## 2018-06-25 DIAGNOSIS — I214 Non-ST elevation (NSTEMI) myocardial infarction: Secondary | ICD-10-CM

## 2018-06-25 DIAGNOSIS — E1100 Type 2 diabetes mellitus with hyperosmolarity without nonketotic hyperglycemic-hyperosmolar coma (NKHHC): Secondary | ICD-10-CM

## 2018-06-25 DIAGNOSIS — J42 Unspecified chronic bronchitis: Secondary | ICD-10-CM

## 2018-06-25 MED ORDER — ALBUTEROL SULFATE HFA 108 (90 BASE) MCG/ACT IN AERS: 2.0000 | INHALATION_SPRAY | RESPIRATORY_TRACT | 2 refills | Status: DC | PRN

## 2018-06-25 MED ORDER — BUDESONIDE-FORMOTEROL FUMARATE 80-4.5 MCG/ACT IN AERO: 2.0000 | INHALATION_SPRAY | Freq: Two times a day (BID) | RESPIRATORY_TRACT | 3 refills | Status: DC

## 2018-06-25 NOTE — Progress Notes (Signed)
S: Connor Reed Connor Reed is a 58 y.o. male reports to clinical pharmacist appointment for inhaler help  Allergies  Allergen Reactions  . Acetaminophen Itching and Rash  . Penicillins Rash, Shortness Of Breath and Swelling   Medication Sig  albuterol (PROVENTIL HFA;VENTOLIN HFA) 108 (90 Base) MCG/ACT inhaler Inhale 2 puffs into the lungs every 4 (four) hours as needed for wheezing or shortness of breath.  aspirin 81 MG chewable tablet CHEW AND SWALLOW 1 TABLET BY MOUTH ONCE DAILY  atorvastatin (LIPITOR) 40 MG tablet Take 1 tablet (40 mg total) by mouth daily.  budesonide-formoterol (SYMBICORT) 80-4.5 MCG/ACT inhaler Inhale 2 puffs into the lungs 2 (two) times daily. Rinse mouth after use  diclofenac sodium (VOLTAREN) 1 % GEL Apply 4 g topically 4 (four) times daily.  empagliflozin (JARDIANCE) 10 MG TABS tablet Take 10 mg by mouth daily.  glucose blood (ONETOUCH VERIO) test strip Test blood sugar two times a day. Diagnosis code E11.00, non-insulin dependent diabetes  nitroGLYCERIN (NITROSTAT) 0.4 MG SL tablet Place 1 tablet (0.4 mg total) under the tongue every 5 (five) minutes as needed for chest pain.  ONETOUCH DELICA LANCETS FINE MISC Use to check blood sugar two times daily. Diagnosis code:E11.00   Past Medical History:  Diagnosis Date  . Asthma   . Chronic bronchitis (HCC)   . Hypertension    Social History   Socioeconomic History  . Marital status: Married    Spouse name: Not on file  . Number of children: Not on file  . Years of education: Not on file  . Highest education level: Not on file  Occupational History  . Not on file  Social Needs  . Financial resource strain: Not on file  . Food insecurity:    Worry: Not on file    Inability: Not on file  . Transportation needs:    Medical: Not on file    Non-medical: Not on file  Tobacco Use  . Smoking status: Former Smoker    Packs/day: 1.00    Years: 20.00    Pack years: 20.00    Last attempt to quit: 06/19/1990    Years  since quitting: 28.0  . Smokeless tobacco: Never Used  Substance and Sexual Activity  . Alcohol use: Yes    Comment: drank 12 pack yesterday; typically 1 x per week  . Drug use: No  . Sexual activity: Not on file  Lifestyle  . Physical activity:    Days per week: Not on file    Minutes per session: Not on file  . Stress: Not on file  Relationships  . Social connections:    Talks on phone: Not on file    Gets together: Not on file    Attends religious service: Not on file    Active member of club or organization: Not on file    Attends meetings of clubs or organizations: Not on file    Relationship status: Not on file  Other Topics Concern  . Not on file  Social History Narrative  . Not on file   Family History  Problem Relation Age of Onset  . Cancer Father   . Diabetes Brother   . Heart attack Maternal Grandfather    O:    Component Value Date/Time   CHOL 147 03/04/2018 0946   HDL 41 03/04/2018 0946   LDLCALC 85 03/04/2018 0946   TRIG 106 03/04/2018 0946   GLUCOSE 162 (H) 03/04/2018 0946   GLUCOSE 175 (H) 12/20/2016 [id number]  HGBA1C 7.2 (A) 03/04/2018 1020   HGBA1C 10.3 (H) 12/17/2016 1246   NA 138 03/04/2018 0946   K 4.9 03/04/2018 0946   CL 100 03/04/2018 0946   CO2 23 03/04/2018 0946   BUN 15 03/04/2018 0946   CREATININE 0.86 03/04/2018 0946   CALCIUM 9.5 03/04/2018 0946   GFRNONAA 96 03/04/2018 0946   GFRAA 110 03/04/2018 0946   AST 13 02/23/2017 0816   ALT 22 02/23/2017 0816   WBC 7.5 02/23/2017 0816   WBC 7.3 12/20/2016 0513   HGB 14.4 02/23/2017 0816   HCT 41.8 02/23/2017 0816   PLT 247 02/23/2017 0816   TSH 1.461 12/18/2016 0317   Ht Readings from Last 2 Encounters:  03/04/18 6' (1.829 m)  04/18/17 6' (1.829 m)   Wt Readings from Last 2 Encounters:  03/04/18 240 lb 11.2 oz (109.2 kg)  04/18/17 223 lb 9.6 oz (101.4 kg)   There is no height or weight on file to calculate BMI. BP Readings from Last 3 Encounters:  03/04/18 (!) 144/96  04/18/17  100/64  02/21/17 112/80    A/P: Patient reports he is unable to afford Advair copay, interchanged patient to Symbicort and provided $0 copay card. Influenza vaccine administered today.  An after visit summary was provided and patient advised to follow up if any changes in condition or questions regarding medications arise. PCP appointment scheduled for 08/23/18.   The patient verbalized understanding of information provided by repeating back concepts discussed.

## 2018-07-01 NOTE — Telephone Encounter (Signed)
Hi Dr. Selena Batten,  Eugenie Birks I think that he should be on 80 mg. I can just fill this prescription correct? Thanks,  [employer]

## 2018-07-05 MED ORDER — ATORVASTATIN CALCIUM 80 MG PO TABS: 80.0000 mg | ORAL_TABLET | Freq: Every day | ORAL | 3 refills | Status: DC

## 2018-07-10 ENCOUNTER — Ambulatory Visit: Payer: Federal, State, Local not specified - PPO | Admitting: Internal Medicine

## 2018-07-10 ENCOUNTER — Encounter: Payer: Self-pay | Admitting: Internal Medicine

## 2018-07-10 VITALS — BP 137/85 | HR 77 | Temp 98.4°F | Wt 241.7 lb

## 2018-07-10 DIAGNOSIS — E1100 Type 2 diabetes mellitus with hyperosmolarity without nonketotic hyperglycemic-hyperosmolar coma (NKHHC): Secondary | ICD-10-CM

## 2018-07-10 DIAGNOSIS — I214 Non-ST elevation (NSTEMI) myocardial infarction: Secondary | ICD-10-CM

## 2018-07-10 LAB — POCT GLYCOSYLATED HEMOGLOBIN (HGB A1C): Hemoglobin A1C: 8 % — AB (ref 4.0–5.6)

## 2018-07-10 LAB — GLUCOSE, CAPILLARY: Glucose-Capillary: 184 mg/dL — ABNORMAL HIGH (ref 70–99)

## 2018-07-10 MED ORDER — ASPIRIN 81 MG PO CHEW: CHEWABLE_TABLET | ORAL | 10 refills | Status: DC

## 2018-07-10 MED ORDER — ATORVASTATIN CALCIUM 40 MG PO TABS: 40.0000 mg | ORAL_TABLET | Freq: Every day | ORAL | 2 refills | Status: DC

## 2018-07-10 MED ORDER — GLUCOSE BLOOD VI STRP: ORAL_STRIP | 3 refills | Status: DC

## 2018-07-10 MED ORDER — EMPAGLIFLOZIN 10 MG PO TABS: 10.0000 mg | ORAL_TABLET | Freq: Every day | ORAL | 2 refills | Status: DC

## 2018-07-10 MED ORDER — BUDESONIDE-FORMOTEROL FUMARATE 80-4.5 MCG/ACT IN AERO: 2.0000 | INHALATION_SPRAY | Freq: Two times a day (BID) | RESPIRATORY_TRACT | 3 refills | Status: DC

## 2018-07-10 NOTE — Progress Notes (Signed)
   CC: Diabetes follow up  HPI:  [name] is a 58 y.o. with PMH listed below who presents to clinic for follow-up of diabetes.  Please see problem based assessment and plan for further details.  Past Medical History:  Diagnosis Date  . Asthma   . Chronic bronchitis (HCC)   . Hypertension    Review of Systems:   Review of Systems  Constitutional: Negative for chills, fever, malaise/fatigue and weight loss.  Respiratory: Negative for shortness of breath.   Cardiovascular: Negative for chest pain and leg swelling.  Gastrointestinal: Negative for abdominal pain, nausea and vomiting.  Genitourinary: Negative for frequency and urgency.  Neurological: Negative for dizziness and headaches.  Endo/Heme/Allergies: Negative for polydipsia.    Physical Exam:  There were no vitals filed for this visit. General: Well-appearing male in no acute distress Mouth: Moist mucous membranes CV:RRR, no mrg Pulm: CTAB, no wheezes or crackles Ext: Warm and well-perfused without pitting edema   Assessment & Plan:   See Encounters Tab for problem based charting.  Patient discussed with Dr. Rogelia Boga

## 2018-07-10 NOTE — Assessment & Plan Note (Signed)
There is previous documentation that patient was not able to tolerate Coreg due to dizziness. He would benefit from beta blocker therapy given history of NSTEMI and multivessel CAD. Patient will follow up with PCP. Recommend discussing initiation of beta blocker during follow-up visit.

## 2018-07-10 NOTE — Patient Instructions (Addendum)
Mr. Thieu,   Your A1c went up to 8 today.  Please read more about Jardiance and consider starting this medication.  I sent a refill to your pharmacy.  Let us know if you have any other questions once you read about this medication.  Continue checking your sugars as you usually do.  remMember to work on [employer] and start exercising as his weight gain can worsen diabetes.  I also sent a refill for atorvastatin, aspirin, Symbicort, and supplies for your meter.  Please make a follow-up appointment with your PCP in 3 months to follow-up on your diabetes.  You can also call make an appointment sooner if you need to.  Please let us know if you have any questions or concerns.  -Dr. Evelene Croon

## 2018-07-10 NOTE — Progress Notes (Signed)
Internal Medicine Clinic Attending  Case discussed with Dr. Santos-Sanchez at the time of the visit.  We reviewed the resident's history and exam and pertinent patient test results.  I agree with the assessment, diagnosis, and plan of care documented in the resident's note.    

## 2018-07-10 NOTE — Assessment & Plan Note (Addendum)
Connor Reed presents for diabetes follow-up.  He was previously on metformin 500 mg twice daily and was switched to Jardiance 10 mg daily due to patient's concern for renal toxicity with metformin.  Last A1c was 7.2 on 02/2018. He reports he never started taking Jardiance and was not aware he was supposed to be on it. He also reports that for the past months he has been eating foods with high sugar content and has not been exercising as much as he usually does. His A1c today is 8. I discussed the benefits (especially cardioprotective effects given his hx of heart disease) as well as the side effects of Jardiance, but patient hesitant to start this due to concern of damaging his only kidney. I encouraged him to read about the medication and call us with any questions or concerns.  I sent a refill to his pharmacy in case he would like to started.  I also send a refill for atorvastatin 40 which she has not been taking either. He declined referral to nutrition.  Recommended follow-up in 3 months with PCP.  Will need a urine microalbumin then.

## 2018-07-12 ENCOUNTER — Telehealth: Payer: Self-pay

## 2018-07-12 NOTE — Telephone Encounter (Signed)
Pt states empagliflozin (JARDIANCE) 10 MG TABS tablet, cost $100.00, unable to get this med. Please call pt back.

## 2018-07-15 NOTE — Telephone Encounter (Signed)
Forwarding to Dr. Selena BattenKim as this is her expertise.

## 2018-07-16 NOTE — Telephone Encounter (Signed)
Scheduled patient appointment to help with this

## 2018-07-18 ENCOUNTER — Ambulatory Visit: Payer: Federal, State, Local not specified - PPO | Admitting: Pharmacist

## 2018-08-01 ENCOUNTER — Telehealth: Payer: Self-pay | Admitting: Internal Medicine

## 2018-08-01 DIAGNOSIS — E1100 Type 2 diabetes mellitus with hyperosmolarity without nonketotic hyperglycemic-hyperosmolar coma (NKHHC): Secondary | ICD-10-CM

## 2018-08-01 MED ORDER — EMPAGLIFLOZIN 10 MG PO TABS: 10.0000 mg | ORAL_TABLET | Freq: Every day | ORAL | 2 refills | Status: DC

## 2018-08-01 NOTE — Telephone Encounter (Signed)
Pt medicine is to expensive for empagliflozin (JARDIANCE) 10 MG TABS tablet ;pt contact [phone]  Can needs another medicine, pls contact?

## 2018-08-01 NOTE — Progress Notes (Signed)
Sent in [place] copay card to [facility].  Dr. Gwyneth Revels, patient was concerned due to having 1 kidney, I advised him ok to take 1/2 tablet of Jardiance for now, can increase to 1 tablet if BG remains elevated > 200, but he is to follow up with you 08/23/18.  Thank you.

## 2018-08-19 ENCOUNTER — Other Ambulatory Visit: Payer: Self-pay | Admitting: Pharmacist

## 2018-08-19 DIAGNOSIS — J42 Unspecified chronic bronchitis: Secondary | ICD-10-CM

## 2018-08-19 DIAGNOSIS — E1100 Type 2 diabetes mellitus with hyperosmolarity without nonketotic hyperglycemic-hyperosmolar coma (NKHHC): Secondary | ICD-10-CM

## 2018-08-19 DIAGNOSIS — I214 Non-ST elevation (NSTEMI) myocardial infarction: Secondary | ICD-10-CM

## 2018-08-19 MED ORDER — EMPAGLIFLOZIN 10 MG PO TABS: 10.0000 mg | ORAL_TABLET | Freq: Every day | ORAL | 3 refills | Status: DC

## 2018-08-19 MED ORDER — BUDESONIDE-FORMOTEROL FUMARATE 80-4.5 MCG/ACT IN AERO: 2.0000 | INHALATION_SPRAY | Freq: Two times a day (BID) | RESPIRATORY_TRACT | 3 refills | Status: DC

## 2018-08-19 MED ORDER — NITROGLYCERIN 0.4 MG SL SUBL: 0.4000 mg | SUBLINGUAL_TABLET | SUBLINGUAL | 3 refills | Status: AC | PRN

## 2018-08-19 MED ORDER — ATORVASTATIN CALCIUM 40 MG PO TABS: 40.0000 mg | ORAL_TABLET | Freq: Every day | ORAL | 3 refills | Status: DC

## 2018-08-23 ENCOUNTER — Encounter: Payer: Federal, State, Local not specified - PPO | Admitting: Internal Medicine

## 2018-10-10 ENCOUNTER — Other Ambulatory Visit: Payer: Self-pay

## 2018-10-10 ENCOUNTER — Ambulatory Visit (INDEPENDENT_AMBULATORY_CARE_PROVIDER_SITE_OTHER): Payer: Federal, State, Local not specified - PPO | Admitting: Internal Medicine

## 2018-10-10 DIAGNOSIS — M25512 Pain in left shoulder: Secondary | ICD-10-CM

## 2018-10-10 DIAGNOSIS — Z7984 Long term (current) use of oral hypoglycemic drugs: Secondary | ICD-10-CM

## 2018-10-10 DIAGNOSIS — E1100 Type 2 diabetes mellitus with hyperosmolarity without nonketotic hyperglycemic-hyperosmolar coma (NKHHC): Secondary | ICD-10-CM

## 2018-10-10 DIAGNOSIS — I10 Essential (primary) hypertension: Secondary | ICD-10-CM | POA: Diagnosis not present

## 2018-10-10 DIAGNOSIS — M25511 Pain in right shoulder: Secondary | ICD-10-CM | POA: Diagnosis not present

## 2018-10-10 DIAGNOSIS — Z9114 Patient's other noncompliance with medication regimen: Secondary | ICD-10-CM

## 2018-10-10 DIAGNOSIS — J42 Unspecified chronic bronchitis: Secondary | ICD-10-CM

## 2018-10-10 DIAGNOSIS — I252 Old myocardial infarction: Secondary | ICD-10-CM

## 2018-10-10 DIAGNOSIS — Z79899 Other long term (current) drug therapy: Secondary | ICD-10-CM

## 2018-10-10 DIAGNOSIS — E119 Type 2 diabetes mellitus without complications: Secondary | ICD-10-CM | POA: Diagnosis not present

## 2018-10-10 DIAGNOSIS — Z7951 Long term (current) use of inhaled steroids: Secondary | ICD-10-CM

## 2018-10-10 DIAGNOSIS — I214 Non-ST elevation (NSTEMI) myocardial infarction: Secondary | ICD-10-CM

## 2018-10-10 MED ORDER — CYCLOBENZAPRINE HCL 5 MG PO TABS: 5.0000 mg | ORAL_TABLET | Freq: Three times a day (TID) | ORAL | 0 refills | Status: DC | PRN

## 2018-10-10 MED ORDER — ALBUTEROL SULFATE HFA 108 (90 BASE) MCG/ACT IN AERS: 2.0000 | INHALATION_SPRAY | RESPIRATORY_TRACT | 2 refills | Status: DC | PRN

## 2018-10-10 MED ORDER — EMPAGLIFLOZIN 10 MG PO TABS: 10.0000 mg | ORAL_TABLET | Freq: Every day | ORAL | 3 refills | Status: DC

## 2018-10-10 NOTE — Progress Notes (Signed)
  [facility] Internal Medicine Residency Telephone Encounter Continuity Care Appointment  HPI:   This telephone encounter was created for [name] on 10/10/2018 for the following purpose/cc shoulder pain, diabetes mellitus, hypertension, bronchitis.   Past Medical History:  Past Medical History:  Diagnosis Date  . Asthma   . Chronic bronchitis (HCC)   . Hypertension       ROS:   Reports bilateral shoulder pain, some shortness of breath. Denies fevers, chills, nausea, vomiting, headaches, chest pain, light headedness, dizziness, or other symptoms.    Assessment / Plan / Recommendations:   Please see A&P under problem oriented charting for assessment of the patient's acute and chronic medical conditions.   As always, pt is advised that if symptoms worsen or new symptoms arise, they should go to an urgent care facility or to to ER for further evaluation.   Consent and Medical Decision Making:   Patient discussed with Dr. Heide Spark  This is a telephone encounter between Connor Reed and Claudean Severance on 10/10/2018 for shoulder pain, diabetes mellitus, hypertension, bronchitis.. The visit was conducted with the patient located at home and Claudean Severance at [facility]. The patient's identity was confirmed using their DOB and current address. The patient has consented to being evaluated through a telephone encounter and understands the associated risks (an examination cannot be done and the patient may need to come in for an appointment) / benefits (allows the patient to remain at home, decreasing exposure to coronavirus). I personally spent 17 minutes on medical discussion.

## 2018-10-11 ENCOUNTER — Other Ambulatory Visit: Payer: Self-pay | Admitting: *Deleted

## 2018-10-11 ENCOUNTER — Telehealth: Payer: Self-pay | Admitting: *Deleted

## 2018-10-11 DIAGNOSIS — M25512 Pain in left shoulder: Secondary | ICD-10-CM | POA: Insufficient documentation

## 2018-10-11 DIAGNOSIS — I10 Essential (primary) hypertension: Secondary | ICD-10-CM

## 2018-10-11 DIAGNOSIS — M25511 Pain in right shoulder: Secondary | ICD-10-CM | POA: Insufficient documentation

## 2018-10-11 DIAGNOSIS — E1100 Type 2 diabetes mellitus with hyperosmolarity without nonketotic hyperglycemic-hyperosmolar coma (NKHHC): Secondary | ICD-10-CM

## 2018-10-11 NOTE — Assessment & Plan Note (Addendum)
Patient is supposed to be on Jardiance 10 mg daily however he reports that he has not been taking this for the past 2 weeks because he felt like this may be causing the shoulder pain. His last A1c was 8.0. He reports that he checks his blood sugars and that it's around 160 normally. He also reports that he has not been taking his atorvastatin. He did report that he has been trying to eat healthier and work out. We discussed that shoulder pain is not a common side effect of Jardiance and that he should restart taking this, he was in agreement with this.   -Restart Jardiance 10 mg daily (not on Metformin due to patients concern about renal toxicity) -Check A1c -Check microalbumin

## 2018-10-11 NOTE — Telephone Encounter (Signed)
Called patient and asked for name and date of birth for ID verification.  Told him Dr. Sherry Ruffing had order some labs for him.  Gave him instructions on going to [facility] to have them drawn.  Also gave hours and location and told him to go sometime in the week of May 11.  Patient voiced understanding.  Grady Mohabir,PBT 11:45am 10/11/18

## 2018-10-11 NOTE — Assessment & Plan Note (Signed)
Patient reports that he has been having some shortness of breath when he gets anxious but reports that it's not common, he has not been using his albuterol. He has been using his symbicort 2 puffs twice a day which helps. He denies any fevers, chills, chest pain, wheezing, light headedness, or dizziness. He was requesting a refill on his albuterol and we discussed that he should use this when he gets short of breath.   Plan: -Refill albuterol -Continue Symbicort 2 puffs BID

## 2018-10-11 NOTE — Progress Notes (Signed)
Internal Medicine Clinic Attending  Case discussed with Dr. Krienke at the time of the visit.  We reviewed the resident's history and exam and pertinent patient test results.  I agree with the assessment, diagnosis, and plan of care documented in the resident's note.    

## 2018-10-11 NOTE — Assessment & Plan Note (Signed)
Patient reports that for about 3-4 months he has been having bilateral shoulder pain. Reports that it feels like an aching pain, worse when he lay on them and improves with Advil and massaging them. He reports that the pain is constant with no change in the morning or night. 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 He denied any lower extremity pain, upper extremity weakness, numbness, or tingling. He reported that he stopped taking his atorvastatin and Jardiance because he thought that this may have been causing them. This appears to be more of a MSK issue. There is a small chance that this could be a statin induced myositis however his symptoms have not improved with discontinuation of the statin and given his occupation it seems more likely that this is a muscle strain. Advised patient to restart his Jardiance and Atorvastatin.   -Trial of Flexeril 5 mg TID PRN -Check CK

## 2018-10-11 NOTE — Assessment & Plan Note (Signed)
Patient is supposed to be on Lisinopril 5 mg daily, he reports that he has not been taking this. He states that he has been checking his blood pressures at home at that it normally ranges from 120-130/90-110. Patient had a history of NSTEMI. I advised him that he should restart taking his lisinopril for this reason.   -Continue lisinopril 5 mg daily

## 2018-10-31 ENCOUNTER — Telehealth: Payer: Self-pay

## 2018-10-31 NOTE — Telephone Encounter (Signed)
YOUR CARDIOLOGY TEAM HAS ARRANGED FOR AN E-VISIT FOR YOUR APPOINTMENT - PLEASE REVIEW IMPORTANT INFORMATION BELOW SEVERAL DAYS PRIOR TO YOUR APPOINTMENT  Due to the recent COVID-19 pandemic, we are transitioning in-person office visits to tele-medicine visits in an effort to decrease unnecessary exposure to our patients, their families, and staff. These visits are billed to your insurance just like a normal visit is. We also encourage you to sign up for MyChart if you have not already done so. You will need a smartphone if possible. For patients that do not have this, we can still complete the visit using a regular telephone but do prefer a smartphone to enable video when possible. You may have a family member that lives with you that can help. If possible, we also ask that you have a blood pressure cuff and scale at home to measure your blood pressure, heart rate and weight prior to your scheduled appointment. Patients with clinical needs that need an in-person evaluation and testing will still be able to come to the office if absolutely necessary. If you have any questions, feel free to call our office.     YOUR PROVIDER WILL BE USING THE FOLLOWING PLATFORM TO COMPLETE YOUR VISIT: Doxy.Me  . IF USING MYCHART - How to Download the MyChart App to Your SmartPhone   - If Apple, go to App Store and type in MyChart in the search bar and download the app. If Android, ask patient to go to Google Play Store and type in MyChart in the search bar and download the app. The app is free but as with any other app downloads, your phone may require you to verify saved payment information or Apple/Android password.  - You will need to then log into the app with your MyChart username and password, and select [facility] as your healthcare provider to link the account.  - When it is time for your visit, go to the MyChart app, find appointments, and click Begin Video Visit. Be sure to Select Allow for your device to  access the Microphone and Camera for your visit. You will then be connected, and your provider will be with you shortly.  **If you have any issues connecting or need assistance, please contact MyChart service desk [phone] ([phone])**  **If using a computer, in order to ensure the best quality for your visit, you will need to use either of the following Internet Browsers: Google Chrome or Microsoft Edge**  . IF USING DOXIMITY or DOXY.ME - The staff will give you instructions on receiving your link to join the meeting the day of your visit.      2-3 DAYS BEFORE YOUR APPOINTMENT  You will receive a telephone call from one of our [facility] team members - your caller ID may say "Unknown caller." If this is a video visit, we will walk you through how to get the video launched on your phone. We will remind you check your blood pressure, heart rate and weight prior to your scheduled appointment. If you have an Apple Watch or Kardia, please upload any pertinent ECG strips the day before or morning of your appointment to MyChart. Our staff will also make sure you have reviewed the consent and agree to move forward with your scheduled tele-health visit.     THE DAY OF YOUR APPOINTMENT  Approximately 15 minutes prior to your scheduled appointment, you will receive a telephone call from one of Heart[facility] team - your caller ID may say "Unknown caller."    Our staff will confirm medications, vital signs for the day and any symptoms you may be experiencing. Please have this information available prior to the time of visit start. It may also be helpful for you to have a pad of paper and pen handy for any instructions given during your visit. They will also walk you through joining the smartphone meeting if this is a video visit.    CONSENT FOR TELE-HEALTH VISIT - PLEASE REVIEW  I hereby voluntarily request, consent and authorize [facility] and its employed or contracted physicians, physician  assistants, nurse practitioners or other licensed health care professionals (the Practitioner), to provide me with telemedicine health care services (the "Services") as deemed necessary by the treating Practitioner. I acknowledge and consent to receive the Services by the Practitioner via telemedicine. I understand that the telemedicine visit will involve communicating with the Practitioner through live audiovisual communication technology and the disclosure of certain medical information by electronic transmission. I acknowledge that I have been given the opportunity to request an in-person assessment or other available alternative prior to the telemedicine visit and am voluntarily participating in the telemedicine visit.  I understand that I have the right to withhold or withdraw my consent to the use of telemedicine in the course of my care at any time, without affecting my right to future care or treatment, and that the Practitioner or I may terminate the telemedicine visit at any time. I understand that I have the right to inspect all information obtained and/or recorded in the course of the telemedicine visit and may receive copies of available information for a reasonable fee.  I understand that some of the potential risks of receiving the Services via telemedicine include:  . Delay or interruption in medical evaluation due to technological equipment failure or disruption; . Information transmitted may not be sufficient (e.g. poor resolution of images) to allow for appropriate medical decision making by the Practitioner; and/or  . In rare instances, security protocols could fail, causing a breach of personal health information.  Furthermore, I acknowledge that it is my responsibility to provide information about my medical history, conditions and care that is complete and accurate to the best of my ability. I acknowledge that Practitioner's advice, recommendations, and/or decision may be based on  factors not within their control, such as incomplete or inaccurate data provided by me or distortions of diagnostic images or specimens that may result from electronic transmissions. I understand that the practice of medicine is not an exact science and that Practitioner makes no warranties or guarantees regarding treatment outcomes. I acknowledge that I will receive a copy of this consent concurrently upon execution via email to the email address I last provided but may also request a printed copy by calling the office of [facility].    I understand that my insurance will be billed for this visit.   I have read or had this consent read to me. . I understand the contents of this consent, which adequately explains the benefits and risks of the Services being provided via telemedicine.  . I have been provided ample opportunity to ask questions regarding this consent and the Services and have had my questions answered to my satisfaction. . I give my informed consent for the services to be provided through the use of telemedicine in my medical care  By participating in this telemedicine visit I agree to the above.  

## 2018-11-01 DIAGNOSIS — R21 Rash and other nonspecific skin eruption: Secondary | ICD-10-CM | POA: Diagnosis not present

## 2018-11-01 DIAGNOSIS — A932 Colorado tick fever: Secondary | ICD-10-CM | POA: Diagnosis not present

## 2018-11-04 ENCOUNTER — Other Ambulatory Visit: Payer: Self-pay

## 2018-11-04 ENCOUNTER — Telehealth (INDEPENDENT_AMBULATORY_CARE_PROVIDER_SITE_OTHER): Payer: Federal, State, Local not specified - PPO | Admitting: Cardiology

## 2018-11-04 ENCOUNTER — Encounter: Payer: Self-pay | Admitting: Cardiology

## 2018-11-04 VITALS — Ht 72.0 in | Wt 224.0 lb

## 2018-11-04 DIAGNOSIS — Z955 Presence of coronary angioplasty implant and graft: Secondary | ICD-10-CM

## 2018-11-04 DIAGNOSIS — I2510 Atherosclerotic heart disease of native coronary artery without angina pectoris: Secondary | ICD-10-CM | POA: Diagnosis not present

## 2018-11-04 DIAGNOSIS — E1100 Type 2 diabetes mellitus with hyperosmolarity without nonketotic hyperglycemic-hyperosmolar coma (NKHHC): Secondary | ICD-10-CM

## 2018-11-04 NOTE — Progress Notes (Signed)
Virtual Visit via Video Note   This visit type was conducted due to national recommendations for restrictions regarding the COVID-19 Pandemic (e.g. social distancing) in an effort to limit this patient's exposure and mitigate transmission in our community.  Due to his co-morbid illnesses, this patient is at least at moderate risk for complications without adequate follow up.  This format is felt to be most appropriate for this patient at this time.  All issues noted in this document were discussed and addressed.  A limited physical exam was performed with this format.  Please refer to the patient's chart for his consent to telehealth for [facility].   Date:  11/04/2018   ID:  Connor Reed, DOB 09-26-1960, MRN [id number]  Patient Location: Home Provider Location: Home  PCP:  Claudean Severance, MD  Cardiologist:  Donato Schultz, MD  Electrophysiologist:  None   Evaluation Performed:  Follow-Up Visit  Chief Complaint:  CAD  History of Present Illness:    Connor Reed is a 58 y.o. male with with coronary artery disease status post myocardial infarction in July 2018 proximal LAD stent placement, return to normal of EF here for follow-up.  Post MI had some dizziness and hypotension.  This was after weight loss.  Lisinopril was discontinued.  He works in Publishing copy, mostly interior.  His prior heart attack surrounded an episode of bronchitis.  Muscle aches - held atorvastatin no change.  Mainly he was feeling some discomfort when he was raising his left arm upright.  He had had a rotator cuff issue in the past.  He wonders if he had reaggravated this.  I explained to him that if he had held his atorvastatin for close to 2 weeks though symptoms should have resolved.  He denies any anginal symptoms no significant shortness of breath.  No fevers chills nausea vomiting syncope.  The patient does not have symptoms concerning for COVID-19 infection (fever, chills, cough, or new shortness of  breath).    Past Medical History:  Diagnosis Date  . Asthma   . Chronic bronchitis (HCC)   . Hypertension    Past Surgical History:  Procedure Laterality Date  . LEFT HEART CATH AND CORONARY ANGIOGRAPHY N/A 12/18/2016   Procedure: Left Heart Cath and Coronary Angiography;  Surgeon: Lyn Records, MD;  Location: [facility] INVASIVE CV LAB;  Service: Cardiovascular;  Laterality: N/A;     Current Meds  Medication Sig  . albuterol (VENTOLIN HFA) 108 (90 Base) MCG/ACT inhaler Inhale 2 puffs into the lungs every 4 (four) hours as needed for wheezing or shortness of breath.  Marland Kitchen aspirin 81 MG chewable tablet CHEW AND SWALLOW 1 TABLET BY MOUTH ONCE DAILY  . atorvastatin (LIPITOR) 40 MG tablet Take 1 tablet (40 mg total) by mouth daily.  . budesonide-formoterol (SYMBICORT) 80-4.5 MCG/ACT inhaler Inhale 2 puffs into the lungs 2 (two) times daily. Rinse mouth after use  . empagliflozin (JARDIANCE) 10 MG TABS tablet Take 10 mg by mouth daily.  Marland Kitchen glucose blood (ONETOUCH VERIO) test strip Test blood sugar two times a day. Diagnosis code E11.00, non-insulin dependent diabetes  . nitroGLYCERIN (NITROSTAT) 0.4 MG SL tablet Place 1 tablet (0.4 mg total) under the tongue every 5 (five) minutes as needed for chest pain.  Letta Pate DELICA LANCETS FINE MISC Use to check blood sugar two times daily. Diagnosis code:E11.00     Allergies:   Acetaminophen and Penicillins   Social History   Tobacco Use  . Smoking status: Former  Smoker    Packs/day: 1.00    Years: 20.00    Pack years: 20.00    Last attempt to quit: 06/19/1990    Years since quitting: 28.3  . Smokeless tobacco: Never Used  Substance Use Topics  . Alcohol use: Yes    Comment: drank 12 pack yesterday; typically 1 x per week  . Drug use: No     Family Hx: The patient's family history includes Cancer in his father; Diabetes in his brother; Heart attack in his maternal grandfather.  ROS:   Please see the history of present illness.     All  other systems reviewed and are negative.   Prior CV studies:   The following studies were reviewed today:  Cardiac catheterization and echo reviewed.  Normal EF.  LAD stent.  Labs/Other Tests and Data Reviewed:    EKG:  An ECG dated 12/21/16 was personally reviewed today and demonstrated:  Normal sinus rhythm nonspecific ST changes  Recent Labs: 03/04/2018: BUN 15; Creatinine, Ser 0.86; Potassium 4.9; Sodium 138   Recent Lipid Panel Lab Results  Component Value Date/Time   CHOL 147 03/04/2018 09:46 AM   TRIG 106 03/04/2018 09:46 AM   HDL 41 03/04/2018 09:46 AM   CHOLHDL 3.6 03/04/2018 09:46 AM   CHOLHDL 5.2 12/18/2016 03:17 AM   LDLCALC 85 03/04/2018 09:46 AM    Wt Readings from Last 3 Encounters:  11/04/18 224 lb (101.6 kg)  07/10/18 241 lb 11.2 oz (109.6 kg)  03/04/18 240 lb 11.2 oz (109.2 kg)     Objective:    Vital Signs:  Ht 6' (1.829 m)   Wt 224 lb (101.6 kg)   BMI 30.38 kg/m    VITAL SIGNS:  reviewed GEN:  no acute distress EYES:  sclerae anicteric, EOMI - Extraocular Movements Intact RESPIRATORY:  normal respiratory effort, symmetric expansion SKIN:  no rash, lesions or ulcers. MUSCULOSKELETAL:  no obvious deformities. NEURO:  alert and oriented x 3, no obvious focal deficit PSYCH:  normal affect  ASSESSMENT & PLAN:    Coronary artery disease, ischemic cardiomyopathy - Return to normal of EF.  Monotherapy with regards to antiplatelet.  Aspirin 81 mg. -LAD stent, MI  Prior hypotension - Dizziness, not able to tolerate carvedilol and lisinopril.  Overall is been doing quite well.  Diabetes with hypertension - Unable to take lisinopril because of hypotension.  Per primary team.  Currently on Jardiance  Hyperlipidemia - Encouraged him to restart his atorvastatin since his shoulder discomfort did not go away after holding the medication for about 2 weeks   COVID-19 Education: The signs and symptoms of COVID-19 were discussed with the patient and how  to seek care for testing (follow up with PCP or arrange E-visit).  The importance of social distancing was discussed today.  Time:   Today, I have spent 18 minutes with the patient with telehealth technology discussing the above problems.     Medication Adjustments/Labs and Tests Ordered: Current medicines are reviewed at length with the patient today.  Concerns regarding medicines are outlined above.   Tests Ordered: No orders of the defined types were placed in this encounter.   Medication Changes: No orders of the defined types were placed in this encounter.   Disposition:  Follow up in 1 year(s)  Signed, Donato SchultzMark Madylyn Insco, MD  11/04/2018 12:26 PM    [facility]

## 2018-11-04 NOTE — Patient Instructions (Signed)
  Medication Instructions:  The current medical regimen is effective;  continue present plan and medications.  If you need a refill on your cardiac medications before your next appointment, please call your pharmacy.   Follow-Up: Follow up in 1 year with Dr. Skains.  You will receive a letter in the mail 2 months before you are due.  Please call us when you receive this letter to schedule your follow up appointment.  Thank you for choosing [facility]!!     

## 2018-12-26 ENCOUNTER — Other Ambulatory Visit: Payer: Self-pay

## 2018-12-26 ENCOUNTER — Telehealth: Payer: Self-pay | Admitting: *Deleted

## 2018-12-26 ENCOUNTER — Ambulatory Visit (INDEPENDENT_AMBULATORY_CARE_PROVIDER_SITE_OTHER): Payer: Federal, State, Local not specified - PPO | Admitting: Internal Medicine

## 2018-12-26 VITALS — BP 156/88 | HR 74 | Ht 72.0 in | Wt 241.1 lb

## 2018-12-26 DIAGNOSIS — M549 Dorsalgia, unspecified: Secondary | ICD-10-CM

## 2018-12-26 DIAGNOSIS — J42 Unspecified chronic bronchitis: Secondary | ICD-10-CM | POA: Diagnosis not present

## 2018-12-26 DIAGNOSIS — Z7951 Long term (current) use of inhaled steroids: Secondary | ICD-10-CM

## 2018-12-26 DIAGNOSIS — E1100 Type 2 diabetes mellitus with hyperosmolarity without nonketotic hyperglycemic-hyperosmolar coma (NKHHC): Secondary | ICD-10-CM | POA: Diagnosis not present

## 2018-12-26 DIAGNOSIS — M25512 Pain in left shoulder: Secondary | ICD-10-CM | POA: Diagnosis not present

## 2018-12-26 DIAGNOSIS — E119 Type 2 diabetes mellitus without complications: Secondary | ICD-10-CM

## 2018-12-26 DIAGNOSIS — Z79899 Other long term (current) drug therapy: Secondary | ICD-10-CM

## 2018-12-26 DIAGNOSIS — Z7984 Long term (current) use of oral hypoglycemic drugs: Secondary | ICD-10-CM

## 2018-12-26 DIAGNOSIS — I214 Non-ST elevation (NSTEMI) myocardial infarction: Secondary | ICD-10-CM

## 2018-12-26 LAB — POCT GLYCOSYLATED HEMOGLOBIN (HGB A1C): Hemoglobin A1C: 7.5 % — AB (ref 4.0–5.6)

## 2018-12-26 LAB — GLUCOSE, CAPILLARY: Glucose-Capillary: 181 mg/dL — ABNORMAL HIGH (ref 70–99)

## 2018-12-26 MED ORDER — ONETOUCH ULTRASOFT LANCETS MISC: 12 refills | Status: DC

## 2018-12-26 MED ORDER — DICLOFENAC SODIUM 1 % TD GEL: 2.0000 g | Freq: Four times a day (QID) | TRANSDERMAL | 3 refills | Status: DC

## 2018-12-26 MED ORDER — ONETOUCH VERIO VI STRP: ORAL_STRIP | 3 refills | Status: AC

## 2018-12-26 MED ORDER — ALBUTEROL SULFATE HFA 108 (90 BASE) MCG/ACT IN AERS: 2.0000 | INHALATION_SPRAY | RESPIRATORY_TRACT | 3 refills | Status: DC | PRN

## 2018-12-26 MED ORDER — ASPIRIN 81 MG PO CHEW: CHEWABLE_TABLET | ORAL | 10 refills | Status: AC

## 2018-12-26 MED ORDER — BUDESONIDE-FORMOTEROL FUMARATE 80-4.5 MCG/ACT IN AERO: 2.0000 | INHALATION_SPRAY | Freq: Two times a day (BID) | RESPIRATORY_TRACT | 3 refills | Status: DC

## 2018-12-26 MED ORDER — METFORMIN HCL 500 MG PO TABS: 500.0000 mg | ORAL_TABLET | Freq: Two times a day (BID) | ORAL | 11 refills | Status: DC

## 2018-12-26 NOTE — Telephone Encounter (Signed)
Thank you. We will evaluate him at his appointment

## 2018-12-26 NOTE — Telephone Encounter (Signed)
Called pt after he spoke w/ telephone receptionist, confirmed his complaints of pain in L shoulder. He denies any abnormal short of breath, N&V, h/a, dizziness. He does state he is more tired than normal. Denies cough, fever. He is ask if any of the above begin before appt time to please go to ED and he is agreeable

## 2018-12-26 NOTE — Assessment & Plan Note (Addendum)
Patient presents to clinic today for one week of left shoulder pain and left upper back pain. He reports that the pain is intermittent and is a dull, aching pain. He reports that the pain is relieved with stretching, deep inspiration, and Advil.  Of note, patient has history of left rotator cuff repair in 2015. 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 On examination, there is pinpoint tenderness at left lateral upper back. There is also pain on adduction of left arm under 90 degrees. Strength is 5/5 in bilateral upper extremities. Likely a muscle spasm/strain as it is relieved by Advil. Given history of unilateral nephrectomy, will opt for topical NSAID tx.   - Diclofenac 1% topical gel

## 2018-12-26 NOTE — Patient Instructions (Addendum)
Mr. Radle  It was a pleasure seeing you in clinic today. Today we discussed your shoulder pain.   Please continue to use your symbicort 2 puffs twice a day and your albuterol 2 puffs every 4 hours as needed.   Please use Diclofenac (Voltaren) 1% gel for your shoulder pain.   Please start taking Metformin 500mg  twice a day with meals.   Please follow up with your PCP in one month or contact us if your symptoms worsen.    Thank you!

## 2018-12-26 NOTE — Assessment & Plan Note (Signed)
Patient reports that he discontinued taking Jardiance and atorvastatin as he was having generalized muscle and joint pains. He reports that he previously tolerated the metformin and would like to continue that. HbA1c 7.5 today  - Restart metformin 500mg  bid  - Refilled lancets and test strips

## 2018-12-26 NOTE — Progress Notes (Signed)
   CC: left shoulder pain  HPI:  [name] is a 58 y.o. male with PMHx as listed below presenting to clinic for left shoulder and left upper back pain for one week. He denies trauma to the area. Please see problem based charting for further assessment and plan.   Past Medical History:  Diagnosis Date  . Asthma   . Chronic bronchitis ([place])   . Hypertension    Review of Systems:  Review of Systems  Constitutional: Negative for chills, fever and malaise/fatigue.  Respiratory: Negative for cough, sputum production and wheezing.   Cardiovascular: Negative for chest pain and palpitations.  Musculoskeletal: Positive for back pain and joint pain. Negative for myalgias.       Right shoulder pain and right upper back pain   Neurological: Negative for dizziness, sensory change, focal weakness and headaches.  Endo/Heme/Allergies: Does not bruise/bleed easily.     Physical Exam:  Vitals:   12/26/18 1542  BP: (!) 156/88  Pulse: 74  SpO2: 98%  Weight: 241 lb 1.6 oz (109.4 kg)  Height: 6' (1.829 m)   Physical Exam  Constitutional: He is well-developed, well-nourished, and in no distress.  HENT:  Head: Normocephalic and atraumatic.  Cardiovascular: Normal rate, regular rhythm and intact distal pulses. Exam reveals no gallop and no friction rub.  No murmur heard. Pulmonary/Chest: Effort normal and breath sounds normal. No respiratory distress. He has no wheezes. He has no rales. He exhibits no tenderness.  Musculoskeletal:        General: Tenderness present. No deformity or edema.     Right shoulder: Normal.     Left shoulder: He exhibits pain. He exhibits no tenderness, no swelling, no effusion, no crepitus, no deformity, no laceration, no spasm and normal strength.     Comments: Pain on adduction of left arm from 90 degrees. No tenderness to palpation of shoulder Pinpoint tenderness present at the left lateral upper back  Strength 5/5 in bilateral upper extremities   Skin: Skin  is warm and dry.     Assessment & Plan:   See Encounters Tab for problem based charting.  Patient seen with Dr. Rebeca Alert

## 2018-12-26 NOTE — Assessment & Plan Note (Signed)
Refilled Symbicort and albuterol today as patient requested.

## 2018-12-27 ENCOUNTER — Telehealth: Payer: Self-pay | Admitting: Internal Medicine

## 2018-12-27 LAB — CMP14 + ANION GAP
ALT: 22 IU/L (ref 0–44)
AST: 14 IU/L (ref 0–40)
Albumin/Globulin Ratio: 1.8 (ref 1.2–2.2)
Albumin: 4.6 g/dL (ref 3.8–4.9)
Alkaline Phosphatase: 89 IU/L (ref 39–117)
Anion Gap: 19 mmol/L — ABNORMAL HIGH (ref 10.0–18.0)
BUN/Creatinine Ratio: 14 (ref 9–20)
BUN: 14 mg/dL (ref 6–24)
Bilirubin Total: 0.8 mg/dL (ref 0.0–1.2)
CO2: 20 mmol/L (ref 20–29)
Calcium: 10.2 mg/dL (ref 8.7–10.2)
Chloride: 99 mmol/L (ref 96–106)
Creatinine, Ser: 0.99 mg/dL (ref 0.76–1.27)
GFR calc Af Amer: 96 mL/min/{1.73_m2} (ref >59)
GFR calc non Af Amer: 83 mL/min/{1.73_m2} (ref >59)
Globulin, Total: 2.6 g/dL (ref 1.5–4.5)
Glucose: 180 mg/dL — ABNORMAL HIGH (ref 65–99)
Potassium: 4.5 mmol/L (ref 3.5–5.2)
Sodium: 138 mmol/L (ref 134–144)
Total Protein: 7.2 g/dL (ref 6.0–8.5)

## 2018-12-27 NOTE — Addendum Note (Signed)
Addended by: Oda Kilts on: 12/27/2018 01:46 PM   Modules accepted: Level of Service

## 2018-12-27 NOTE — Telephone Encounter (Signed)
Spoke to patient regarding shoulder pain and results. Patient stated that shoulder is still hurting and he has only taken Advil. He has not tried the Diclofenac gel yet. Patient advised to use the gel and if not improved, RTC on Monday.

## 2018-12-27 NOTE — Progress Notes (Signed)
Internal Medicine Clinic Attending  I saw and evaluated the patient.  I personally confirmed the key portions of the history and exam documented by Dr. Aslam and I reviewed pertinent patient test results.  The assessment, diagnosis, and plan were formulated together and I agree with the documentation in the resident's note.  Alexander Raines, M.D., Ph.D.  

## 2019-03-13 NOTE — Addendum Note (Signed)
Addended by: Truddie Crumble on: 03/13/2019 10:55 AM   Modules accepted: Orders

## 2019-07-11 ENCOUNTER — Other Ambulatory Visit: Payer: Self-pay | Admitting: Internal Medicine

## 2019-07-11 DIAGNOSIS — J42 Unspecified chronic bronchitis: Secondary | ICD-10-CM

## 2019-07-11 MED ORDER — ALBUTEROL SULFATE HFA 108 (90 BASE) MCG/ACT IN AERS: 2.0000 | INHALATION_SPRAY | RESPIRATORY_TRACT | 3 refills | Status: AC | PRN

## 2019-07-11 NOTE — Telephone Encounter (Signed)
Received call from patient.  States he has stopped taking his Symbicort several months ago because he "didn't feel it was doing anything".  States over the last month, he feels he is needing to take a deeper breath and is using his albuterol more.  He recently found an old Advair discus and starting using this and states it has helped him.  He denies any fever, chills, fatigue, or loss of appetite.   He is agreeable to a visit next week if PCP thinks this is warranted, but He is requesting refills on his albuterol and a RX for Advair again  (which has fallen off his med list). Will forward to pcp) Thank you, SChaplin, RN,BSN

## 2019-07-11 NOTE — Telephone Encounter (Signed)
I will refill the albuterol prescription however he will need an office visit to discuss switching back to Advair. Thank you.

## 2019-07-11 NOTE — Telephone Encounter (Signed)
Pt called and notified of Dr. Sylvester Harder recommendation, he verbalized understanding and an appt was made for Monday, 07/14/19 @ 3:15 in [facility]. SChaplin, RN,BSN

## 2019-07-14 ENCOUNTER — Ambulatory Visit (INDEPENDENT_AMBULATORY_CARE_PROVIDER_SITE_OTHER): Payer: Federal, State, Local not specified - PPO | Admitting: Internal Medicine

## 2019-07-14 ENCOUNTER — Other Ambulatory Visit: Payer: Self-pay

## 2019-07-14 ENCOUNTER — Encounter: Payer: Self-pay | Admitting: Internal Medicine

## 2019-07-14 VITALS — BP 134/86 | HR 76 | Temp 98.9°F | Ht 72.0 in | Wt 243.7 lb

## 2019-07-14 DIAGNOSIS — I10 Essential (primary) hypertension: Secondary | ICD-10-CM | POA: Diagnosis not present

## 2019-07-14 DIAGNOSIS — J42 Unspecified chronic bronchitis: Secondary | ICD-10-CM | POA: Diagnosis not present

## 2019-07-14 DIAGNOSIS — J45909 Unspecified asthma, uncomplicated: Secondary | ICD-10-CM

## 2019-07-14 DIAGNOSIS — R05 Cough: Secondary | ICD-10-CM

## 2019-07-14 DIAGNOSIS — Z7952 Long term (current) use of systemic steroids: Secondary | ICD-10-CM

## 2019-07-14 DIAGNOSIS — F419 Anxiety disorder, unspecified: Secondary | ICD-10-CM

## 2019-07-14 MED ORDER — FLUTICASONE-SALMETEROL 250-50 MCG/DOSE IN AEPB: 1.0000 | INHALATION_SPRAY | Freq: Two times a day (BID) | RESPIRATORY_TRACT | 3 refills | Status: DC

## 2019-07-14 MED ORDER — FLUTICASONE-SALMETEROL 100-50 MCG/DOSE IN AEPB: 1.0000 | INHALATION_SPRAY | Freq: Two times a day (BID) | RESPIRATORY_TRACT | 11 refills | Status: DC

## 2019-07-14 NOTE — Assessment & Plan Note (Signed)
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  He does suggest that his anxiety might be worsening his cough and asthma.  He also tells me that he has been taking Xanax for "a while "though I am unable to discover and then [place] narcotic database with PCP prescribing the Xanax.  His GAD-7 score was 6.   Plan: -He will follow up with his PCP and was agreeable, we will refer him to see our integrated behavioral health specialists Lysle Rubens -In the future, he can be started on a therapeutic SSRI or an SNRI

## 2019-07-14 NOTE — Patient Instructions (Addendum)
Connor Reed,   It was a pleasure taking care of you. I will increase your Advair and see how you do.   I will also have you see Dr. Gwyneth Revels to follow up as well.   If your symptoms worsen, please let us know.  Take Care!

## 2019-07-14 NOTE — Progress Notes (Signed)
   CC: Cough  HPI:  [name] is a 59 y.o. gentleman with longstanding history of asthma and chronic bronchitis who is here for evaluation of dry cough.  Please see problem based charting for further details.  Past Medical History:  Diagnosis Date  . Asthma   . Chronic bronchitis (HCC)   . Hypertension    Review of Systems:  As per HPI  Physical Exam:  Vitals:   07/14/19 1521  Weight: 243 lb 11.2 oz (110.5 kg)  Height: 6' (1.829 m)   Physical Exam  Constitutional: He is well-developed, well-nourished, and in no distress.  Cardiovascular: Normal rate, regular rhythm and normal heart sounds. Exam reveals no friction rub.  No murmur heard. Pulmonary/Chest: Effort normal and breath sounds normal. No respiratory distress. He has no wheezes.  Psychiatric: Mood and affect normal.    Assessment & Plan:   See Encounters Tab for problem based charting.  Patient discussed with Dr. Rogelia Boga

## 2019-07-14 NOTE — Progress Notes (Signed)
Internal Medicine Clinic Attending  Case discussed with Dr. Agyei at the time of the visit.  We reviewed the resident's history and exam and pertinent patient test results.  I agree with the assessment, diagnosis, and plan of care documented in the resident's note.    

## 2019-07-14 NOTE — Assessment & Plan Note (Signed)
Cough: Connor Reed states that he has a longstanding history of asthma and chronic bronchitis.  He states that for the past 3 to 4 weeks he has been experiencing dry cough with intermittent white sputum production.  He denies shortness of breath, wheezing.  He states that he has been using his Advair about 3-4 times a day.  He has not been using his albuterol because "it does not work.  "He states that usually he has seasonal bronchitis especially in the winter months.  3 to 4 weeks ago he did experience "a bad flu with nasal congestion."  At the same time, he has been experiencing pleuritic pain at the left scapula as well as the left side of his neck which improves 20 minutes after taking Advil.  He denies postnasal drip or acid reflux.  On physical exams, his lungs are clear to auscultation without evidence of wheezing or crackles.  Plan: -Increase Advair from 100-50 mcg per dose to 250-50 mcg per dose -Continue albuterol as needed

## 2019-07-15 ENCOUNTER — Telehealth: Payer: Self-pay

## 2019-07-15 ENCOUNTER — Other Ambulatory Visit: Payer: Self-pay | Admitting: Internal Medicine

## 2019-07-15 DIAGNOSIS — J42 Unspecified chronic bronchitis: Secondary | ICD-10-CM

## 2019-07-15 LAB — BMP8+ANION GAP
Anion Gap: 17 mmol/L (ref 10.0–18.0)
BUN/Creatinine Ratio: 16 (ref 10–24)
BUN: 16 mg/dL (ref 8–27)
CO2: 21 mmol/L (ref 20–29)
Calcium: 9.9 mg/dL (ref 8.6–10.2)
Chloride: 97 mmol/L (ref 96–106)
Creatinine, Ser: 0.97 mg/dL (ref 0.76–1.27)
GFR calc Af Amer: 98 mL/min/{1.73_m2} (ref >59)
GFR calc non Af Amer: 84 mL/min/{1.73_m2} (ref >59)
Glucose: 355 mg/dL — ABNORMAL HIGH (ref 65–99)
Potassium: 4.8 mmol/L (ref 3.5–5.2)
Sodium: 135 mmol/L (ref 134–144)

## 2019-07-15 NOTE — Telephone Encounter (Signed)
Requesting to speak with Dr. Dortha Schwalbe. Please call pt back.

## 2019-08-20 ENCOUNTER — Other Ambulatory Visit: Payer: Self-pay

## 2019-08-20 ENCOUNTER — Encounter: Payer: Self-pay | Admitting: Nurse Practitioner

## 2019-08-20 ENCOUNTER — Ambulatory Visit: Payer: Federal, State, Local not specified - PPO | Admitting: Nurse Practitioner

## 2019-08-20 VITALS — BP 150/86 | HR 81 | Ht 72.0 in | Wt 239.8 lb

## 2019-08-20 DIAGNOSIS — E1100 Type 2 diabetes mellitus with hyperosmolarity without nonketotic hyperglycemic-hyperosmolar coma (NKHHC): Secondary | ICD-10-CM

## 2019-08-20 DIAGNOSIS — I259 Chronic ischemic heart disease, unspecified: Secondary | ICD-10-CM | POA: Diagnosis not present

## 2019-08-20 DIAGNOSIS — Z955 Presence of coronary angioplasty implant and graft: Secondary | ICD-10-CM

## 2019-08-20 DIAGNOSIS — E785 Hyperlipidemia, unspecified: Secondary | ICD-10-CM | POA: Diagnosis not present

## 2019-08-20 DIAGNOSIS — Z720 Tobacco use: Secondary | ICD-10-CM | POA: Diagnosis not present

## 2019-08-20 DIAGNOSIS — I2510 Atherosclerotic heart disease of native coronary artery without angina pectoris: Secondary | ICD-10-CM

## 2019-08-20 DIAGNOSIS — Z7189 Other specified counseling: Secondary | ICD-10-CM

## 2019-08-20 DIAGNOSIS — R0602 Shortness of breath: Secondary | ICD-10-CM | POA: Diagnosis not present

## 2019-08-20 DIAGNOSIS — R5383 Other fatigue: Secondary | ICD-10-CM

## 2019-08-20 DIAGNOSIS — E782 Mixed hyperlipidemia: Secondary | ICD-10-CM

## 2019-08-20 MED ORDER — ROSUVASTATIN CALCIUM 5 MG PO TABS: 5.0000 mg | ORAL_TABLET | Freq: Every day | ORAL | 3 refills | Status: DC

## 2019-08-20 NOTE — Patient Instructions (Addendum)
After Visit Summary:  We will be checking the following labs today - BMET, CBC, HPF, Lipids, TSH and BNP   Medication Instructions:    Continue with your current medicines. BUT  I am adding Crestor 5 mg today - this is at your pharmacy   If you need a refill on your cardiac medications before your next appointment, please call your pharmacy.     Testing/Procedures To Be Arranged:  Lexiscan Myoview  Follow-Up:   See me in about 4 weeks - we will do fasting labs that day    At [facility], you and your health needs are our priority.  As part of our continuing mission to provide you with exceptional heart care, we have created designated Provider Care Teams.  These Care Teams include your primary Cardiologist (physician) and Advanced Practice Providers (APPs -  Physician Assistants and Nurse Practitioners) who all work together to provide you with the care you need, when you need it.  Special Instructions:  . Stay safe, stay home, wash your hands for at least 20 seconds and wear a mask when out in public.  . It was good to talk with you today.  . I want you to consider a sleep study     You are scheduled for a Myocardial Perfusion Imaging Study on ________________ at _____________________________.   Please arrive 15 minutes prior to your appointment time for registration and insurance purposes.   The test will take approximately 3 to 4 hours to complete; you may bring reading material. If someone comes with you to your appointment, they will need to remain in the main lobby due to limited space in the testing area.    How to prepare for your Myocardial Perfusion test:   Do not eat or drink 3 hours prior to your test, except you may have water.    Do not consume products containing caffeine (regular or decaffeinated) 12 hours prior to your test (ex: coffee, chocolate, soda, tea)   Do bring a list of your current medications with you. If not listed below, you may take  your medications as normal.    Bring any held medication to your appointment, as you may be required to take it once the test is complete.   Do wear comfortable clothes (no dresses or overalls) and walking shoes. Tennis shoes are preferred. No heels or open toed shoes.  Do not wear cologne, perfume, aftershave or lotions (deodorant is allowed).   If these instructions are not followed, you test will have to be rescheduled.   Please report to [address] for your test. If you have questions or concerns about your appointment, please call the Nuclear Lab at #[phone].  If you cannot keep your appointment, please provide 24 hour notification to the Nuclear lab to avoid a possible $50 charge to your account.     Call the [facility] office at [phone] if you have any questions, problems or concerns.

## 2019-08-20 NOTE — Progress Notes (Signed)
CARDIOLOGY OFFICE NOTE  Date:  08/20/2019    Connor Reed Date of Birth: September 17, 1960 Medical Record #[id number]  PCP:  Asencion Noble, MD  Cardiologist:  [facility]  Chief Complaint  Patient presents with  . Follow-up    Seen for Dr. Marlou Porch    History of Present Illness: Connor Reed is a 59 y.o. male who presents today for a work in visit. Seen for Dr. Marlou Porch.   He has a known history of CAD with prior MI in July of 2018 with PCI to LAD with return to normal EF. Did have some dizziness and low BP afterwards - ACE was stopped.   Last seen in June by telehealth visit with Dr. Marlou Porch - felt to be doing ok.   The patient does not have symptoms concerning for COVID-19 infection (fever, chills, cough, or new shortness of breath).   Comes in today. Here alone. His wife actually called and made this visit today. He has several concerns. Has not felt well over the past 6 to 8 months. Not sleeping. Hard to breath at times. Pain on the left side of his back - that has been pretty constant. Can't "get going in the mornings". He has had several "lung infections" - on several inhalers. Not exercising. He does snore. No actual chest pain but sometimes he notes his left arm feels "weird". Does not feel like this is similar to prior chest pain syndrome but that seemed atypical as well. He knows he needs to work on his weight and get back to walking. He is not on any cholesterol medicine - he is not sure why- then felt like he has not tolerated - sounds like he has been on Lipitor and Crestor in the past - has never tried low dose therapy. No swelling noted.   Past Medical History:  Diagnosis Date  . Asthma   . Chronic bronchitis ([place])   . Hypertension     Past Surgical History:  Procedure Laterality Date  . LEFT HEART CATH AND CORONARY ANGIOGRAPHY N/A 12/18/2016   Procedure: Left Heart Cath and Coronary Angiography;  Surgeon: Belva Crome, MD;  Location: [facility];  Service:  Cardiovascular;  Laterality: N/A;     Medications: Current Meds  Medication Sig  . albuterol (VENTOLIN HFA) 108 (90 Base) MCG/ACT inhaler Inhale 2 puffs into the lungs every 4 (four) hours as needed for wheezing or shortness of breath.  Marland Kitchen aspirin 81 MG chewable tablet CHEW AND SWALLOW 1 TABLET BY MOUTH ONCE DAILY  . budesonide-formoterol (SYMBICORT) 80-4.5 MCG/ACT inhaler INHALE 2 PUFFS INTO THE LUNGS TWICE DAILY. RINSE MOUTH AFTER USE  . diclofenac sodium (VOLTAREN) 1 % GEL Apply 2 g topically 4 (four) times daily.  . Fluticasone-Salmeterol (ADVAIR DISKUS) 250-50 MCG/DOSE AEPB Inhale 1 puff into the lungs 2 (two) times daily.  Marland Kitchen glucose blood (ONETOUCH VERIO) test strip Test blood sugar two times a day. Diagnosis code E11.00, non-insulin dependent diabetes  . Lancets (ONETOUCH ULTRASOFT) lancets Use as instructed  . metFORMIN (GLUCOPHAGE) 500 MG tablet Take 1 tablet (500 mg total) by mouth 2 (two) times daily with a meal.  . ONETOUCH DELICA LANCETS FINE MISC Use to check blood sugar two times daily. Diagnosis code:E11.00     Allergies: Allergies  Allergen Reactions  . Acetaminophen Itching and Rash  . Penicillins Rash, Shortness Of Breath and Swelling    Social History: The patient  reports that he quit smoking about 29 years  ago. He has a 20.00 pack-year smoking history. He has never used smokeless tobacco. He reports current alcohol use. He reports that he does not use drugs.   Family History: The patient's family history includes Cancer in his father; Diabetes in his brother; Heart attack in his maternal grandfather.   Review of Systems: Please see the history of present illness.   All other systems are reviewed and negative.   Physical Exam: VS:  BP (!) 150/86   Pulse 81   Ht 6' (1.829 m)   Wt 239 lb 12.8 oz (108.8 kg)   SpO2 97%   BMI 32.52 kg/m  .  BMI Body mass index is 32.52 kg/m.  Wt Readings from Last 3 Encounters:  08/20/19 239 lb 12.8 oz (108.8 kg)    07/14/19 243 lb 11.2 oz (110.5 kg)  12/26/18 241 lb 1.6 oz (109.4 kg)   BP is 130/78 by me.   General: Alert and in no acute distress.  He is obese. Weight is down a few pounds.  Cardiac: Regular rate and rhythm. No murmurs, rubs, or gallops. No edema.  Respiratory:  Lungs are clear to auscultation bilaterally with normal work of breathing.  GI: Soft and nontender.  MS: No deformity or atrophy. Gait and ROM intact.  Skin: Warm and dry. Color is normal.  Neuro:  Strength and sensation are intact and no gross focal deficits noted.  Psych: Alert, appropriate and with normal affect.   LABORATORY DATA:  EKG:  EKG is ordered today. This demonstrates NSR with non specific changes - unchanged.  Lab Results  Component Value Date   WBC 7.5 02/23/2017   HGB 14.4 02/23/2017   HCT 41.8 02/23/2017   PLT 247 02/23/2017   GLUCOSE 355 (H) 07/14/2019   CHOL 147 03/04/2018   TRIG 106 03/04/2018   HDL 41 03/04/2018   LDLCALC 85 03/04/2018   ALT 22 12/26/2018   AST 14 12/26/2018   NA 135 07/14/2019   K 4.8 07/14/2019   CL 97 07/14/2019   CREATININE 0.97 07/14/2019   BUN 16 07/14/2019   CO2 21 07/14/2019   TSH 1.461 12/18/2016   INR 1.09 12/18/2016   HGBA1C 7.5 (A) 12/26/2018     BNP (last 3 results) No results for input(s): BNP in the last 8760 hours.  ProBNP (last 3 results) No results for input(s): PROBNP in the last 8760 hours.   Other Studies Reviewed Today:  Echo Study Conclusions 2018  - Left ventricle: The cavity size was normal. Wall thickness was  normal. Systolic function was normal. The estimated ejection  fraction was in the range of 55% to 60%. Left ventricular  diastolic function parameters were normal.    Left Heart Cath and Coronary Angiography 2018  Conclusion   Non-ST elevation anterior wall myocardial infarction with vague ongoing chest pain greater than 24 hours.  Subtotal occlusion thrombotic obstruction in the mid LAD which is also supplied  by collaterals from the right coronary.  Successful angioplasty and stenting of the mid LAD from 99.9% with TIMI grade one flow to 0% with TIMI grade 3 flow using Onyx 3.5 x 18 DES postdilated to 3.75 mm in diameter.  Successful direct stenting of the mid circumflex 80% stenosis to 0% with TIMI grade 3 flow using a 3.5 x 12 mm Onyx deployed at 14 atm.  30-40% proximal to mid RCA which is noted above supplied collaterals to the apical and mid LAD.  Left ventricular systolic dysfunction with anteroapical severe hypokinesis,  ejection fraction 40%, and elevated LVEDP consistent with acute combined systolic and diastolic heart failure.  RECOMMENDATIONS:   Aggrastat will be continued for 10 hours.  Aspirin and Plavix 1 year.  Aggressive risk factor modification.  Contemplate discharge within the next 24 hours after guidelines based management of acute combined systolic and diastolic heart failure.    ASSESSMENT & PLAN:    1. CAD with prior MI with stent to the LAD - not on statin - needs to get back to CV risk factor modification - lots of atypical symptoms - will arrange for stress testing. Further disposition to follow.   2. Ischemic CM - his EF has recovered - will see what his EF is on Myoview - may need echo.   3. Prior hypotension - this has not recurred - BP recheck by me is improved - I have asked him to monitor at home.   4. DM - per PCP  5. Recurrent bronchitis - on multiple inhalers.   6. HLD - not on statin - sounds like he has some degree of intolerance - he is willing to try Crestor 5 mg a day. Lab today. May need to consider referral to lipid clinic.   7. Profound fatigue - I suspect he has OSA - he does not wish to have a sleep study - I have asked him to consider. Will revisit this on return.  8. Obesity - he knows what he needs to get back to.   9. COVID-19 Education: The signs and symptoms of COVID-19 were discussed with the patient and how to seek care for  testing (follow up with PCP or arrange E-visit).  The importance of social distancing, staying at home, hand hygiene and wearing a mask when out in public were discussed today.  Current medicines are reviewed with the patient today.  The patient does not have concerns regarding medicines other than what has been noted above.  The following changes have been made:  See above.  Labs/ tests ordered today include:    Orders Placed This Encounter  Procedures  . Basic metabolic panel  . CBC  . Hepatic function panel  . Lipid panel  . TSH  . Pro b natriuretic peptide (BNP)  . MYOCARDIAL PERFUSION IMAGING  . EKG 12-Lead     Disposition:   FU with me in 4 weeks.   Patient is agreeable to this plan and will call if any problems develop in the interim.   SignedTruitt Merle, NP  08/20/2019 4:25 PM  [facility] [address] [place],   [address] Phone: [phone] Fax: [phone]

## 2019-08-21 ENCOUNTER — Telehealth: Payer: Self-pay | Admitting: *Deleted

## 2019-08-21 ENCOUNTER — Telehealth (HOSPITAL_COMMUNITY): Payer: Self-pay

## 2019-08-21 LAB — HEPATIC FUNCTION PANEL
ALT: 31 IU/L (ref 0–44)
AST: 21 IU/L (ref 0–40)
Albumin: 4.6 g/dL (ref 3.8–4.9)
Alkaline Phosphatase: 82 IU/L (ref 39–117)
Bilirubin Total: 0.9 mg/dL (ref 0.0–1.2)
Bilirubin, Direct: 0.24 mg/dL (ref 0.00–0.40)
Total Protein: 7.2 g/dL (ref 6.0–8.5)

## 2019-08-21 LAB — CBC
Hematocrit: 46.3 % (ref 37.5–51.0)
Hemoglobin: 15.6 g/dL (ref 13.0–17.7)
MCH: 29.5 pg (ref 26.6–33.0)
MCHC: 33.7 g/dL (ref 31.5–35.7)
MCV: 88 fL (ref 79–97)
Platelets: 275 10*3/uL (ref 150–450)
RBC: 5.29 x10E6/uL (ref 4.14–5.80)
RDW: 13.4 % (ref 11.6–15.4)
WBC: 8.8 10*3/uL (ref 3.4–10.8)

## 2019-08-21 LAB — BASIC METABOLIC PANEL
BUN/Creatinine Ratio: 13 (ref 10–24)
BUN: 14 mg/dL (ref 8–27)
CO2: 20 mmol/L (ref 20–29)
Calcium: 9.7 mg/dL (ref 8.6–10.2)
Chloride: 104 mmol/L (ref 96–106)
Creatinine, Ser: 1.1 mg/dL (ref 0.76–1.27)
GFR calc Af Amer: 84 mL/min/{1.73_m2} (ref >59)
GFR calc non Af Amer: 73 mL/min/{1.73_m2} (ref >59)
Glucose: 141 mg/dL — ABNORMAL HIGH (ref 65–99)
Potassium: 4.2 mmol/L (ref 3.5–5.2)
Sodium: 140 mmol/L (ref 134–144)

## 2019-08-21 LAB — LIPID PANEL
Chol/HDL Ratio: 5 ratio (ref 0.0–5.0)
Cholesterol, Total: 189 mg/dL (ref 100–199)
HDL: 38 mg/dL — ABNORMAL LOW (ref >39)
LDL Chol Calc (NIH): 118 mg/dL — ABNORMAL HIGH (ref 0–99)
Triglycerides: 188 mg/dL — ABNORMAL HIGH (ref 0–149)
VLDL Cholesterol Cal: 33 mg/dL (ref 5–40)

## 2019-08-21 LAB — TSH: TSH: 1.46 u[IU]/mL (ref 0.450–4.500)

## 2019-08-21 LAB — PRO B NATRIURETIC PEPTIDE: NT-Pro BNP: 234 pg/mL — ABNORMAL HIGH (ref 0–210)

## 2019-08-21 NOTE — Telephone Encounter (Signed)
Spoke with the patient, detailed instructions given. He stated that he would be here for his test. Asked to call back with any questions. S.Derick Seminara EMTP 

## 2019-08-21 NOTE — Telephone Encounter (Signed)
I called the pharmacist at [facility] and confirmed Rx for Crestor was received yesterday 08/20/19. I then called the pt and assured him his Crestor is ready for pick up as of yesterday. Pt thanked me for the call and the help.

## 2019-08-26 ENCOUNTER — Other Ambulatory Visit: Payer: Self-pay

## 2019-08-26 ENCOUNTER — Ambulatory Visit (HOSPITAL_COMMUNITY): Payer: Federal, State, Local not specified - PPO | Attending: Cardiology

## 2019-08-26 DIAGNOSIS — E1100 Type 2 diabetes mellitus with hyperosmolarity without nonketotic hyperglycemic-hyperosmolar coma (NKHHC): Secondary | ICD-10-CM | POA: Diagnosis not present

## 2019-08-26 DIAGNOSIS — Z955 Presence of coronary angioplasty implant and graft: Secondary | ICD-10-CM | POA: Diagnosis not present

## 2019-08-26 DIAGNOSIS — I2510 Atherosclerotic heart disease of native coronary artery without angina pectoris: Secondary | ICD-10-CM | POA: Diagnosis not present

## 2019-08-26 LAB — MYOCARDIAL PERFUSION IMAGING
LV dias vol: 120 mL (ref 62–150)
LV sys vol: 58 mL
Peak HR: 82 {beats}/min
Rest HR: 55 {beats}/min
SDS: 2
SRS: 1
SSS: 3
TID: 1.17

## 2019-08-26 MED ORDER — TECHNETIUM TC 99M TETROFOSMIN IV KIT
32.6000 | PACK | Freq: Once | INTRAVENOUS | Status: AC | PRN
  Administered 2019-08-26: 32.6 via INTRAVENOUS
  Filled 2019-08-26: qty 33

## 2019-08-26 MED ORDER — REGADENOSON 0.4 MG/5ML IV SOLN
0.4000 mg | Freq: Once | INTRAVENOUS | Status: AC
  Administered 2019-08-26: 0.4 mg via INTRAVENOUS

## 2019-08-26 MED ORDER — TECHNETIUM TC 99M TETROFOSMIN IV KIT
10.9000 | PACK | Freq: Once | INTRAVENOUS | Status: AC | PRN
  Administered 2019-08-26: 10.9 via INTRAVENOUS
  Filled 2019-08-26: qty 11

## 2019-09-12 DIAGNOSIS — R52 Pain, unspecified: Secondary | ICD-10-CM | POA: Diagnosis not present

## 2019-09-12 DIAGNOSIS — M19022 Primary osteoarthritis, left elbow: Secondary | ICD-10-CM | POA: Diagnosis not present

## 2019-09-16 NOTE — Progress Notes (Deleted)
CARDIOLOGY OFFICE NOTE  Date:  09/16/2019    Connor Reed Date of Birth: 1960-11-02 Medical Record #[id number]  PCP:  Connor Noble, MD  Cardiologist:  Connor Reed   No chief complaint on file.   History of Present Illness: Connor Reed is a 59 y.o. male who presents today for a follow up visit. Seen for Dr. Marlou Reed.   He has a known history of CAD with prior MI in July of 2018 with PCI to LAD with return to normal EF. Did have some dizziness and low BP afterwards - ACE was stopped.   Last seen in June by telehealth visit with Dr. Marlou Reed - felt to be doing ok. I saw him last month - he had several concerns - had not felt well for about 6 to 8 months - not sleeping, hard to breath, constant back pain, recurrent lung infections, arms feeling "weird", not on lipids etc. Stress testing was arranged along with lots of encouragement to get back to CV risk factor modification. Added back low dose statin. He declined sleep evaluation.   The patient {does/does not:200015} have symptoms concerning for COVID-19 infection (fever, chills, cough, or new shortness of breath).   Comes in today. Here with   Past Medical History:  Diagnosis Date   Asthma    Chronic bronchitis ([place])    Hypertension     Past Surgical History:  Procedure Laterality Date   LEFT HEART CATH AND CORONARY ANGIOGRAPHY N/A 12/18/2016   Procedure: Left Heart Cath and Coronary Angiography;  Surgeon: Belva Crome, MD;  Location: [facility];  Service: Cardiovascular;  Laterality: N/A;     Medications: No outpatient medications have been marked as taking for the 09/24/19 encounter (Appointment) with Connor Junes, NP.     Allergies: Allergies  Allergen Reactions   Acetaminophen Itching and Rash   Penicillins Rash, Shortness Of Breath and Swelling    Social History: The patient  reports that he quit smoking about 29 years ago. He has a 20.00 pack-year smoking history. He has  never used smokeless tobacco. He reports current alcohol use. He reports that he does not use drugs.   Family History: The patient's ***family history includes Cancer in his father; Diabetes in his brother; Heart attack in his maternal grandfather.   Review of Systems: Please see the history of present illness.   All other systems are reviewed and negative.   Physical Exam: VS:  There were no vitals taken for this visit. Marland Kitchen  BMI There is no height or weight on file to calculate BMI.  Wt Readings from Last 3 Encounters:  08/26/19 239 lb (108.4 kg)  08/20/19 239 lb 12.8 oz (108.8 kg)  07/14/19 243 lb 11.2 oz (110.5 kg)    General: Pleasant. Well developed, well nourished and in no acute distress.   HEENT: Normal.  Neck: Supple, no JVD, carotid bruits, or masses noted.  Cardiac: ***Regular rate and rhythm. No murmurs, rubs, or gallops. No edema.  Respiratory:  Lungs are clear to auscultation bilaterally with normal work of breathing.  GI: Soft and nontender.  MS: No deformity or atrophy. Gait and ROM intact.  Skin: Warm and dry. Color is normal.  Neuro:  Strength and sensation are intact and no gross focal deficits noted.  Psych: Alert, appropriate and with normal affect.   LABORATORY DATA:  EKG:  EKG {ACTION; IS/IS GUY:[id number]} ordered today.  Personally reviewed by me. This demonstrates ***.  Lab Results  Component Value Date   WBC 8.8 08/20/2019   HGB 15.6 08/20/2019   HCT 46.3 08/20/2019   PLT 275 08/20/2019   GLUCOSE 141 (H) 08/20/2019   CHOL 189 08/20/2019   TRIG 188 (H) 08/20/2019   HDL 38 (L) 08/20/2019   LDLCALC 118 (H) 08/20/2019   ALT 31 08/20/2019   AST 21 08/20/2019   NA 140 08/20/2019   K 4.2 08/20/2019   CL 104 08/20/2019   CREATININE 1.10 08/20/2019   BUN 14 08/20/2019   CO2 20 08/20/2019   TSH 1.460 08/20/2019   INR 1.09 12/18/2016   HGBA1C 7.5 (A) 12/26/2018     BNP (last 3 results) No results for input(s): BNP in the last 8760  hours.  ProBNP (last 3 results) Recent Labs    08/20/19 1631  PROBNP 234*     Other Studies Reviewed Today:  Myoview Study Highlights 08/2019    Nuclear stress EF: 51%. No significant wall motion abnormalities.  There was no ST segment deviation noted during stress.  Defect 1: There is a small defect of mild severity present in the apical anterior location, small infarct pattern. No ischemia identified.  This is a low risk study. No ischemia identified. Prior LAD stent.   Candee Furbish, MD    Echo Study Conclusions 2018  - Left ventricle: The cavity size was normal. Wall thickness was  normal. Systolic function was normal. The estimated ejection  fraction was in the range of 55% to 60%. Left ventricular  diastolic function parameters were normal.    Left Heart Cath and Coronary Angiography 2018  Conclusion   Non-ST elevation anterior wall myocardial infarction with vague ongoing chest pain greater than 24 hours.  Subtotal occlusion thrombotic obstruction in the mid LAD which is also supplied by collaterals from the right coronary.  Successful angioplasty and stenting of the mid LAD from 99.9% with TIMI grade one flow to 0% with TIMI grade 3 flow using Onyx 3.5 x 18 DES postdilated to 3.75 mm in diameter.  Successful direct stenting of the mid circumflex 80% stenosis to 0% with TIMI grade 3 flow using a 3.5 x 12 mm Onyx deployed at 14 atm.  30-40% proximal to mid RCA which is noted above supplied collaterals to the apical and mid LAD.  Left ventricular systolic dysfunction with anteroapical severe hypokinesis, ejection fraction 40%, and elevated LVEDP consistent with acute combined systolic and diastolic heart failure.  RECOMMENDATIONS:   Aggrastat will be continued for 10 hours.  Aspirin and Plavix 1 year.  Aggressive risk factor modification.  Contemplate discharge within the next 24 hours after guidelines based management of acute combined  systolic and diastolic heart failure.    ASSESSMENT & PLAN:   1. CAD with prior MI with stent to the LAD - not on statin - needs to get back to CV risk factor modification - lots of atypical symptoms - will arrange for stress testing. Further disposition to follow.   2. Ischemic CM - his EF has recovered - will see what his EF is on Myoview - may need echo.   3. Prior hypotension - this has not recurred - BP recheck by me is improved - I have asked him to monitor at home.   4. DM - per PCP  5. Recurrent bronchitis - on multiple inhalers.   6. HLD - not on statin - sounds like he has some degree of intolerance - he is willing to try Crestor 5 mg  a day. Lab today. May need to consider referral to lipid clinic.   7. Profound fatigue - I suspect he has OSA - he does not wish to have a sleep study - I have asked him to consider. Will revisit this on return.  8. Obesity - he knows what he needs to get back to.    Marland Kitchen COVID-19 Education: The signs and symptoms of COVID-19 were discussed with the patient and how to seek care for testing (follow up with PCP or arrange E-visit).  The importance of social distancing, staying at home, hand hygiene and wearing a mask when out in public were discussed today.  Current medicines are reviewed with the patient today.  The patient does not have concerns regarding medicines other than what has been noted above.  The following changes have been made:  See above.  Labs/ tests ordered today include:   No orders of the defined types were placed in this encounter.    Disposition:   FU with *** in {gen number 7-20:[id number]} {Days to years:10300}.   Patient is agreeable to this plan and will call if any problems develop in the interim.   SignedTruitt Merle, NP  09/16/2019 7:48 AM  [facility] [address] [place], [place]  [address] Phone: [phone] Fax: [phone]

## 2019-09-24 ENCOUNTER — Ambulatory Visit: Payer: Federal, State, Local not specified - PPO | Admitting: Nurse Practitioner

## 2019-11-12 ENCOUNTER — Encounter: Payer: Self-pay | Admitting: Internal Medicine

## 2019-11-12 ENCOUNTER — Ambulatory Visit: Payer: Federal, State, Local not specified - PPO | Admitting: Internal Medicine

## 2019-11-12 VITALS — BP 143/77 | HR 75 | Temp 99.1°F | Ht 72.0 in | Wt 238.8 lb

## 2019-11-12 DIAGNOSIS — E1165 Type 2 diabetes mellitus with hyperglycemia: Secondary | ICD-10-CM | POA: Diagnosis not present

## 2019-11-12 DIAGNOSIS — M79642 Pain in left hand: Secondary | ICD-10-CM | POA: Diagnosis not present

## 2019-11-12 DIAGNOSIS — E1100 Type 2 diabetes mellitus with hyperosmolarity without nonketotic hyperglycemic-hyperosmolar coma (NKHHC): Secondary | ICD-10-CM | POA: Diagnosis not present

## 2019-11-12 DIAGNOSIS — M25522 Pain in left elbow: Secondary | ICD-10-CM | POA: Diagnosis not present

## 2019-11-12 DIAGNOSIS — M25512 Pain in left shoulder: Secondary | ICD-10-CM

## 2019-11-12 LAB — POCT GLYCOSYLATED HEMOGLOBIN (HGB A1C): Hemoglobin A1C: 9.3 % — AB (ref 4.0–5.6)

## 2019-11-12 LAB — GLUCOSE, CAPILLARY: Glucose-Capillary: 309 mg/dL — ABNORMAL HIGH (ref 70–99)

## 2019-11-12 MED ORDER — METFORMIN HCL 500 MG PO TABS: 500.0000 mg | ORAL_TABLET | Freq: Two times a day (BID) | ORAL | 5 refills | Status: DC

## 2019-11-12 MED ORDER — DICLOFENAC SODIUM 1 % EX GEL: 4.0000 g | Freq: Four times a day (QID) | CUTANEOUS | 0 refills | Status: DC

## 2019-11-12 MED ORDER — NAPROXEN 500 MG PO TABS: 500.0000 mg | ORAL_TABLET | Freq: Two times a day (BID) | ORAL | 0 refills | Status: DC

## 2019-11-12 NOTE — Progress Notes (Signed)
   CC: Left hand and elbow pain  HPI:  [name] is a 59 y.o. with a history of HTN, asthma, and chronic bronchitis presenting for left hand and elbow pain.   Patient reports that he has been having left elbow pain for 2-90, he reports that he went to a sports medicine doctor and had imaging done that showed arthritis, there are no results or information in the chart.  His main concern is that his left wrist and hand pain that has been going on for the past 1.5 weeks.  He reports that it started out of nowhere, he denied any trauma or falls.  He works at a [employer], frequently picks up large and heavy items.  Denies any recent bug bites that he is aware of.  He reports that he played golf this weekend and was having some pain in his hand and his elbow at that time.  This morning he reports that he developed significant swelling of his whole hands, reported some difficulty with movement at that time, he now reports that that has almost completely resolved.  He denied any fevers, chills, redness, warmth, or any other symptoms at this time.  He has used an elbow brace and a wrist brace which he feels has helped with his symptoms.  He is using Advil quite frequently, up to 3 tablets/day.  He denies ever having an issue with this before.  He reports eating steak occasionally, and hamburgers and steak last night.  Denies any smoking history.  Reports occasional alcohol use, about 2-4 beers in the afternoon, about 3-4 times per week.  Past Medical History:  Diagnosis Date  . Asthma   . Chronic bronchitis ([facility])   . Hypertension    Review of Systems:   Constitutional: Negative for chills and fever.  Respiratory: Negative for shortness of breath.   Cardiovascular: Negative for chest pain and leg swelling.  Gastrointestinal: Negative for abdominal pain, nausea and vomiting.  Neurological: Negative for dizziness and headaches.  MSK: Left arm and elbow pain  Physical  Exam:  Vitals:   11/12/19 1611  BP: (!) 143/77  Pulse: 75  Temp: 99.1 F (37.3 C)  TempSrc: Oral  SpO2: 98%  Weight: 238 lb 12.8 oz (108.3 kg)  Height: 6' (1.829 m)   Physical Exam HENT:     Head: Normocephalic and atraumatic.  Cardiovascular:     Rate and Rhythm: Normal rate and regular rhythm.  Pulmonary:     Effort: Pulmonary effort is normal. No respiratory distress.     Breath sounds: Normal breath sounds.  Abdominal:     General: Bowel sounds are normal. There is no distension.     Palpations: Abdomen is soft.  Musculoskeletal:        General: Tenderness (TTP over left 2nd MCP joint, pain with active and passive flexion of 2nd digit) present. No edema.     Cervical back: Normal range of motion and neck supple.  Skin:    General: Skin is warm and dry.     Findings: No erythema or rash.  Neurological:     Mental Status: He is alert and oriented to person, place, and time.  Psychiatric:        Mood and Affect: Mood and affect normal.     Assessment & Plan:   See Encounters Tab for problem based charting.  Patient discussed with Dr. Antony Contras

## 2019-11-12 NOTE — Patient Instructions (Addendum)
[name],  It was a pleasure to see you today. Thank you for coming in.   Today we discussed your right elbow and hand pain. Please start using the voltaren gel and taking naproxen for the next 2 weeks. We are getting some imaging to further evaluate this.   We also discussed your diabetes. We are checking an A 1c today. Continue taking the metformin medication. Please bring your meter in on your next visit.   Please return to clinic in 2 months or sooner if needed.   Thank you again for coming in.   Claudean Severance.D.

## 2019-11-13 ENCOUNTER — Encounter: Payer: Self-pay | Admitting: Internal Medicine

## 2019-11-13 DIAGNOSIS — M79642 Pain in left hand: Secondary | ICD-10-CM | POA: Insufficient documentation

## 2019-11-13 NOTE — Assessment & Plan Note (Signed)
Patient is prescribed Metformin 500 mg twice a day, he reports taking metformin once a day. He also has been going out to eat a lot. He drinks sugary sodas. Discussed results of A1c on 9.3. Diabetes is not contorlled. He reported that he is going back up to twice a day, advised his that we would recommend increasing the dosage as well however he reports wanting to adjust his diet and start exercising for now.   -RTC in 2 months -Advised to resume metformin 500 mg BID, can increase this on next visit

## 2019-11-13 NOTE — Assessment & Plan Note (Signed)
  Patient reports that he has been having left elbow pain for 2-90, he reports that he went to a sports medicine doctor and had imaging done that showed arthritis, there are no results or information in the chart.  His main concern is that his left wrist and hand pain that has been going on for the past 1.5 weeks.  He reports that it started out of nowhere, he denied any trauma or falls.  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  Denies any recent bug bites that he is aware of.  He reports that he played golf this weekend and was having some pain in his hand and his elbow at that time.  This morning he reports that he developed significant swelling of his whole hands, reported some difficulty with movement at that time, he now reports that that has almost completely resolved.  He denied any fevers, chills, redness, warmth, or any other symptoms at this time.  He has used an elbow brace and a wrist brace which he feels has helped with his symptoms.  He is using Advil quite frequently, up to 3 tablets/day.  He denies ever having an issue with this before.  He reports eating steak occasionally, and hamburgers and steak last night.  Denies any smoking history.  Reports occasional alcohol use, about 2-4 beers in the afternoon, about 3-4 times per week.  On exam his elbow had no tenderness to palpation, with normal range of motion, no swelling or warmth noted. His left hand had mild tenderness to palpation over the second MCP joints, and pain with active and passive flexion of the second digit, no significant swelling or color change noted.   Initially there was concern about an inflammatory arthropathy, however exam is reassuring at this time.  Could be related to osteoarthritis. Will obtain imaging and start short course of NSAIDs for now. May benefit from inflammatory arthritis work up, with ESR, CRP, ANA, RF, etc, if symptoms persist.   -Hand and elbow  x-ray -Voltaren gel  -Short course of naproxen -RTC in 2 months

## 2019-11-17 NOTE — Progress Notes (Signed)
Internal Medicine Clinic Attending  Case discussed with Dr. Krienke at the time of the visit.  We reviewed the resident's history and exam and pertinent patient test results.  I agree with the assessment, diagnosis, and plan of care documented in the resident's note.    

## 2019-12-03 DIAGNOSIS — Z79899 Other long term (current) drug therapy: Secondary | ICD-10-CM | POA: Diagnosis not present

## 2019-12-03 DIAGNOSIS — E1165 Type 2 diabetes mellitus with hyperglycemia: Secondary | ICD-10-CM | POA: Diagnosis not present

## 2019-12-03 DIAGNOSIS — I2510 Atherosclerotic heart disease of native coronary artery without angina pectoris: Secondary | ICD-10-CM | POA: Diagnosis not present

## 2019-12-03 DIAGNOSIS — Z125 Encounter for screening for malignant neoplasm of prostate: Secondary | ICD-10-CM | POA: Diagnosis not present

## 2019-12-04 DIAGNOSIS — Z125 Encounter for screening for malignant neoplasm of prostate: Secondary | ICD-10-CM | POA: Diagnosis not present

## 2019-12-04 DIAGNOSIS — Z79899 Other long term (current) drug therapy: Secondary | ICD-10-CM | POA: Diagnosis not present

## 2019-12-04 DIAGNOSIS — E1165 Type 2 diabetes mellitus with hyperglycemia: Secondary | ICD-10-CM | POA: Diagnosis not present

## 2019-12-23 DIAGNOSIS — E1165 Type 2 diabetes mellitus with hyperglycemia: Secondary | ICD-10-CM | POA: Diagnosis not present

## 2019-12-23 DIAGNOSIS — E7801 Familial hypercholesterolemia: Secondary | ICD-10-CM | POA: Diagnosis not present

## 2020-01-08 ENCOUNTER — Encounter: Payer: Federal, State, Local not specified - PPO | Admitting: Internal Medicine

## 2020-02-25 DIAGNOSIS — R519 Headache, unspecified: Secondary | ICD-10-CM | POA: Diagnosis not present

## 2020-02-25 DIAGNOSIS — Z20828 Contact with and (suspected) exposure to other viral communicable diseases: Secondary | ICD-10-CM | POA: Diagnosis not present

## 2020-02-25 DIAGNOSIS — J189 Pneumonia, unspecified organism: Secondary | ICD-10-CM | POA: Diagnosis not present

## 2020-03-04 DIAGNOSIS — R0602 Shortness of breath: Secondary | ICD-10-CM | POA: Diagnosis not present

## 2020-03-04 DIAGNOSIS — J189 Pneumonia, unspecified organism: Secondary | ICD-10-CM | POA: Diagnosis not present

## 2020-03-09 NOTE — Progress Notes (Signed)
CARDIOLOGY OFFICE NOTE  Date:  03/10/2020    Connor Reed Date of Birth: September 13, 1960 Medical Record #[id number]  PCP:  Enid Skeens., MD  Cardiologist:  Marisa Cyphers    Chief Complaint  Patient presents with  . Follow-up    Seen for Dr. Marlou Porch    History of Present Illness: Connor Reed is a 59 y.o. male who presents today for a follow up visit. Seen for Dr. Marlou Porch.   He has a known history of CAD with prior MI in July of 2018 with PCI to LAD with return to normal EF. Did have some dizziness and low BP afterwards - ACE was stopped.   Last seen in June by telehealth visit with Dr. Marlou Porch - felt to be doing ok. I saw him this past March - he had several concerns - was not sleeping, lung infections, arms feeling "weird" and basically not feeling well. Myoview was updated - this was low risk - he did not keep follow up.   Comes in today. Here alone. He is not feeling well now. Just "hurts" all the time - takes lots of deep breaths. Has used some Xanax and felt better. He is under lots of stress with work and is thinking of making changes - moving [place] farm. He got a new PCP - he is on more Metformin now and his statin has been changed. He is happy with him.   He notes a pain in his left scapular region - he can "pop" his neck - and this sensation goes a way. He can take Advil and it goes a way. He feels like he needs to stretch. It is not exertional. Stress makes it worse. He has had known spinal disease. No actual chest pain. He does use some Xanax and is almost out of those - this seems to help him relax.   Past Medical History:  Diagnosis Date  . Asthma   . Chronic bronchitis ([place])   . Hypertension     Past Surgical History:  Procedure Laterality Date  . LEFT HEART CATH AND CORONARY ANGIOGRAPHY N/A 12/18/2016   Procedure: Left Heart Cath and Coronary Angiography;  Surgeon: Belva Crome, MD;  Location: [facility];  Service: Cardiovascular;  Laterality:  N/A;     Medications: Current Meds  Medication Sig  . albuterol (VENTOLIN HFA) 108 (90 Base) MCG/ACT inhaler Inhale 2 puffs into the lungs every 4 (four) hours as needed for wheezing or shortness of breath.  Marland Kitchen aspirin 81 MG chewable tablet CHEW AND SWALLOW 1 TABLET BY MOUTH ONCE DAILY  . atorvastatin (LIPITOR) 10 MG tablet Take 10 mg by mouth daily.  . budesonide-formoterol (SYMBICORT) 80-4.5 MCG/ACT inhaler INHALE 2 PUFFS INTO THE LUNGS TWICE DAILY. RINSE MOUTH AFTER USE  . diclofenac Sodium (VOLTAREN) 1 % GEL Apply 4 g topically 4 (four) times daily.  . Fluticasone-Salmeterol (ADVAIR DISKUS) 250-50 MCG/DOSE AEPB Inhale 1 puff into the lungs 2 (two) times daily.  Marland Kitchen glucose blood (ONETOUCH VERIO) test strip Test blood sugar two times a day. Diagnosis code E11.00, non-insulin dependent diabetes  . Lancets (ONETOUCH ULTRASOFT) lancets Use as instructed  . metFORMIN (GLUCOPHAGE) 1000 MG tablet Take 1,000 mg by mouth 2 (two) times daily with a meal.  . ONETOUCH DELICA LANCETS FINE MISC Use to check blood sugar two times daily. Diagnosis code:E11.00     Allergies: Allergies  Allergen Reactions  . Acetaminophen Itching and Rash  . Penicillins  Rash, Shortness Of Breath and Swelling    Social History: The patient  reports that he quit smoking about 29 years ago. He has a 20.00 pack-year smoking history. He has never used smokeless tobacco. He reports current alcohol use. He reports that he does not use drugs.   Family History: The patient's family history includes Cancer in his father; Diabetes in his brother; Heart attack in his maternal grandfather.   Review of Systems: Please see the history of present illness.   All other systems are reviewed and negative.   Physical Exam: VS:  BP 130/80   Pulse 70   Wt 234 lb (106.1 kg)   SpO2 95%   BMI 31.74 kg/m  .  BMI Body mass index is 31.74 kg/m.  Wt Readings from Last 3 Encounters:  03/10/20 234 lb (106.1 kg)  11/12/19 238 lb 12.8  oz (108.3 kg)  08/26/19 239 lb (108.4 kg)    General: Pleasant. Alert and in no acute distress.   Cardiac: Regular rate and rhythm. No murmurs, rubs, or gallops. No edema.  Respiratory:  Lungs are clear to auscultation bilaterally with normal work of breathing.  GI: Soft and nontender.  MS: No deformity or atrophy. Gait and ROM intact.  Skin: Warm and dry. Color is normal.  Neuro:  Strength and sensation are intact and no gross focal deficits noted.  Psych: Alert, appropriate and with normal affect.   LABORATORY DATA:  EKG:  EKG is not ordered today.   Lab Results  Component Value Date   WBC 8.8 08/20/2019   HGB 15.6 08/20/2019   HCT 46.3 08/20/2019   PLT 275 08/20/2019   GLUCOSE 141 (H) 08/20/2019   CHOL 189 08/20/2019   TRIG 188 (H) 08/20/2019   HDL 38 (L) 08/20/2019   LDLCALC 118 (H) 08/20/2019   ALT 31 08/20/2019   AST 21 08/20/2019   NA 140 08/20/2019   K 4.2 08/20/2019   CL 104 08/20/2019   CREATININE 1.10 08/20/2019   BUN 14 08/20/2019   CO2 20 08/20/2019   TSH 1.460 08/20/2019   INR 1.09 12/18/2016   HGBA1C 9.3 (A) 11/12/2019     BNP (last 3 results) No results for input(s): BNP in the last 8760 hours.  ProBNP (last 3 results) Recent Labs    08/20/19 1631  PROBNP 234*     Other Studies Reviewed Today:  Myoview Study Highlights 08/2019    Nuclear stress EF: 51%. No significant wall motion abnormalities.  There was no ST segment deviation noted during stress.  Defect 1: There is a small defect of mild severity present in the apical anterior location, small infarct pattern. No ischemia identified.  This is a low risk study. No ischemia identified. Prior LAD stent.   Candee Furbish, MD    Echo Study Conclusions 2018  - Left ventricle: The cavity size was normal. Wall thickness was  normal. Systolic function was normal. The estimated ejection  fraction was in the range of 55% to 60%. Left ventricular  diastolic function parameters  were normal.    Left Heart Cath and Coronary Angiography 2018  Conclusion   Non-ST elevation anterior wall myocardial infarction with vague ongoing chest pain greater than 24 hours.  Subtotal occlusion thrombotic obstruction in the mid LAD which is also supplied by collaterals from the right coronary.  Successful angioplasty and stenting of the mid LAD from 99.9% with TIMI grade one flow to 0% with TIMI grade 3 flow using Onyx 3.5 x 18  DES postdilated to 3.75 mm in diameter.  Successful direct stenting of the mid circumflex 80% stenosis to 0% with TIMI grade 3 flow using a 3.5 x 12 mm Onyx deployed at 14 atm.  30-40% proximal to mid RCA which is noted above supplied collaterals to the apical and mid LAD.  Left ventricular systolic dysfunction with anteroapical severe hypokinesis, ejection fraction 40%, and elevated LVEDP consistent with acute combined systolic and diastolic heart failure.  RECOMMENDATIONS:   Aggrastat will be continued for 10 hours.  Aspirin and Plavix 1 year.  Aggressive risk factor modification.  Contemplate discharge within the next 24 hours after guidelines based management of acute combined systolic and diastolic heart failure.    ASSESSMENT & PLAN:   1. Atypical chest pain - sounds musculoskeletal/cervical spine to me - recent low risk stress Myoview. Anxiety/stress also playing a role. He is going to get back with PCP to discuss.   2. CAD with prior MI and stent to the LAD - low risk Myoview from March - he needs aggressive CV risk factor modification - challenging given his stress levels. No exertional symptoms.   3. ICM - EF has recovered - EF was 51% by Myoview.   4. HTN - BP is fine today by me. No changes made.   5. Stress/anxiety - I think this is playing a big role - he is thinking of making some major changes in his life - also wants to consider some medicine - would defer that to PCP.   6. HLD - now on Lipitor - he thinks this is  10 mg - he has follow up lab with his new PCP in a few weeks.   Current medicines are reviewed with the patient today.  The patient does not have concerns regarding medicines other than what has been noted above.  The following changes have been made:  See above.  Labs/ tests ordered today include:   No orders of the defined types were placed in this encounter.    Disposition:   FU with me in about 3 months.   Patient is agreeable to this plan and will call if any problems develop in the interim.   SignedTruitt Merle, NP  03/10/2020 3:24 PM  [facility] [address] [place], [place]  [address] Phone: [phone] Fax: [phone]

## 2020-03-10 ENCOUNTER — Ambulatory Visit: Payer: Federal, State, Local not specified - PPO | Admitting: Nurse Practitioner

## 2020-03-10 ENCOUNTER — Other Ambulatory Visit: Payer: Self-pay

## 2020-03-10 ENCOUNTER — Encounter: Payer: Self-pay | Admitting: Nurse Practitioner

## 2020-03-10 VITALS — BP 130/80 | HR 70 | Wt 234.0 lb

## 2020-03-10 DIAGNOSIS — I2510 Atherosclerotic heart disease of native coronary artery without angina pectoris: Secondary | ICD-10-CM

## 2020-03-10 DIAGNOSIS — E782 Mixed hyperlipidemia: Secondary | ICD-10-CM | POA: Diagnosis not present

## 2020-03-10 DIAGNOSIS — Z955 Presence of coronary angioplasty implant and graft: Secondary | ICD-10-CM | POA: Diagnosis not present

## 2020-03-10 DIAGNOSIS — R5383 Other fatigue: Secondary | ICD-10-CM | POA: Diagnosis not present

## 2020-03-10 DIAGNOSIS — E1100 Type 2 diabetes mellitus with hyperosmolarity without nonketotic hyperglycemic-hyperosmolar coma (NKHHC): Secondary | ICD-10-CM

## 2020-03-10 NOTE — Patient Instructions (Addendum)
After Visit Summary:  We will be checking the following labs today - NONE   Medication Instructions:    Continue with your current medicines.    If you need a refill on your cardiac medications before your next appointment, please call your pharmacy.     Testing/Procedures To Be Arranged:  N/A  Follow-Up:   See me in about 3 months    At [facility], you and your health needs are our priority.  As part of our continuing mission to provide you with exceptional heart care, we have created designated Provider Care Teams.  These Care Teams include your primary Cardiologist (physician) and Advanced Practice Providers (APPs -  Physician Assistants and Nurse Practitioners) who all work together to provide you with the care you need, when you need it.  Special Instructions:  . Stay safe, wash your hands for at least 20 seconds and wear a mask when needed.  . It was good to talk with you today.  . Get back with your primary care.    Call the [facility] office at [phone] if you have any questions, problems or concerns.

## 2020-03-26 DIAGNOSIS — E7801 Familial hypercholesterolemia: Secondary | ICD-10-CM | POA: Diagnosis not present

## 2020-03-26 DIAGNOSIS — F418 Other specified anxiety disorders: Secondary | ICD-10-CM | POA: Diagnosis not present

## 2020-03-26 DIAGNOSIS — Z79899 Other long term (current) drug therapy: Secondary | ICD-10-CM | POA: Diagnosis not present

## 2020-03-26 DIAGNOSIS — E1165 Type 2 diabetes mellitus with hyperglycemia: Secondary | ICD-10-CM | POA: Diagnosis not present

## 2020-04-02 DIAGNOSIS — Z79899 Other long term (current) drug therapy: Secondary | ICD-10-CM | POA: Diagnosis not present

## 2020-04-02 DIAGNOSIS — E7801 Familial hypercholesterolemia: Secondary | ICD-10-CM | POA: Diagnosis not present

## 2020-04-02 DIAGNOSIS — E1165 Type 2 diabetes mellitus with hyperglycemia: Secondary | ICD-10-CM | POA: Diagnosis not present

## 2020-04-04 IMAGING — CR DG KNEE COMPLETE 4+V*L*
4 series · 4 of 4 positions shown · non-contrast
Comparison: None

CLINICAL DATA: Chronic LEFT knee pain for 2-3 months worse with
weight-bearing, no known injury

EXAM:
LEFT KNEE - COMPLETE 4+ VIEW

[knee ap]
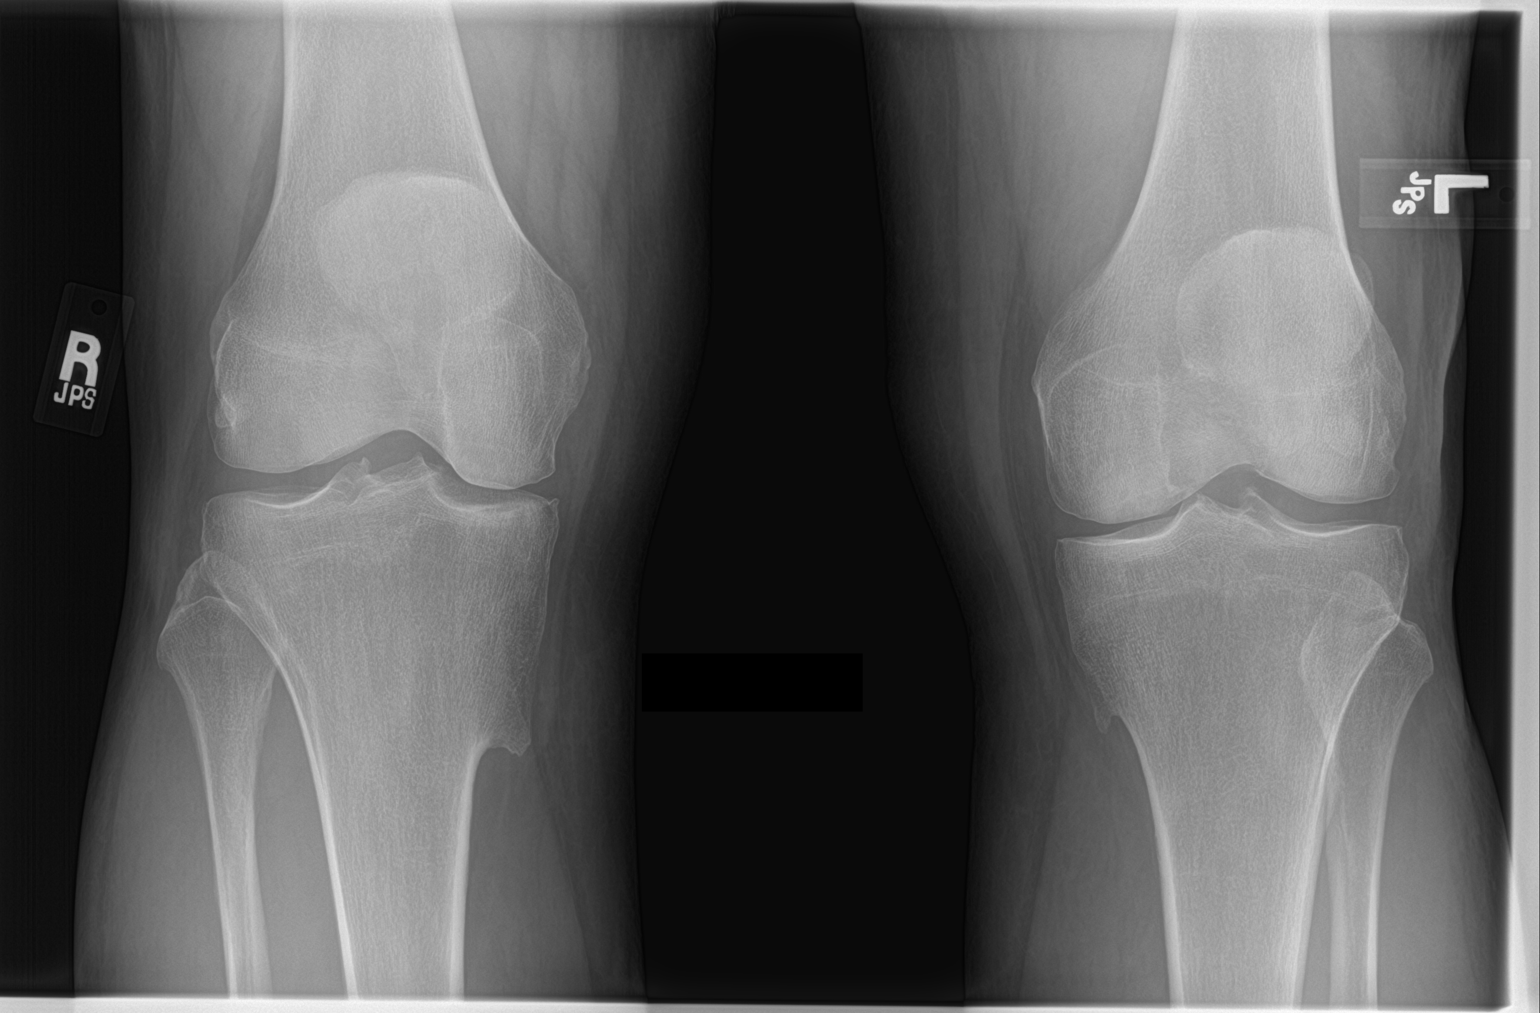

[knee lat]
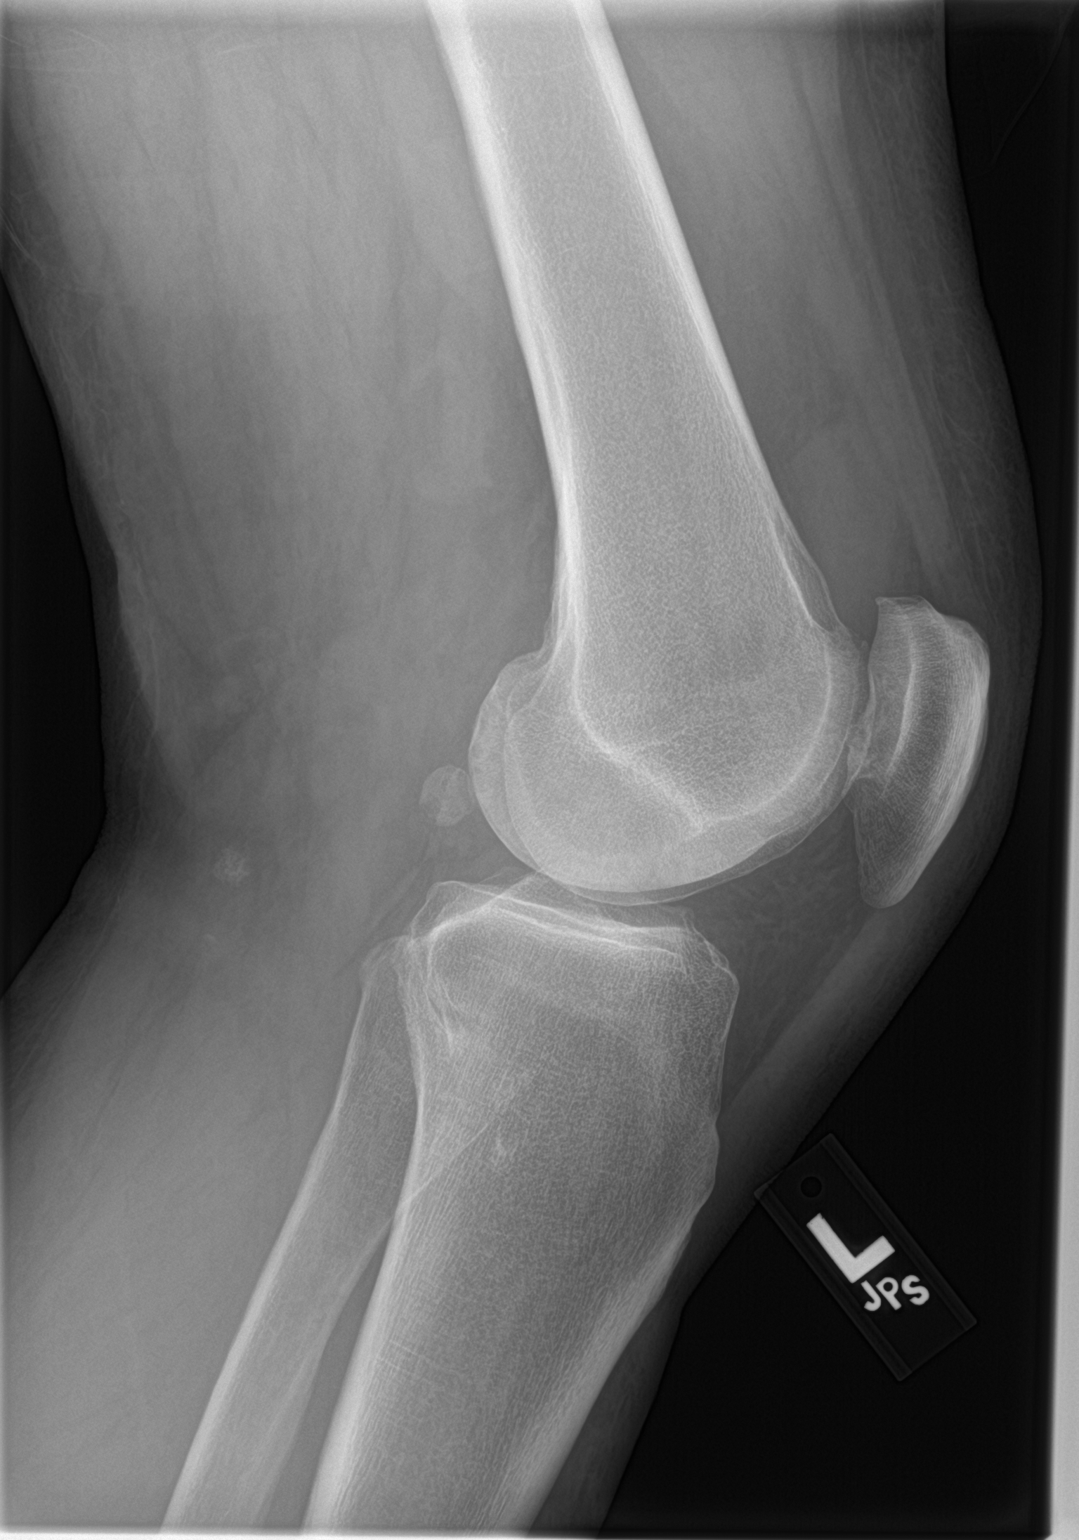

[knee sunrise]
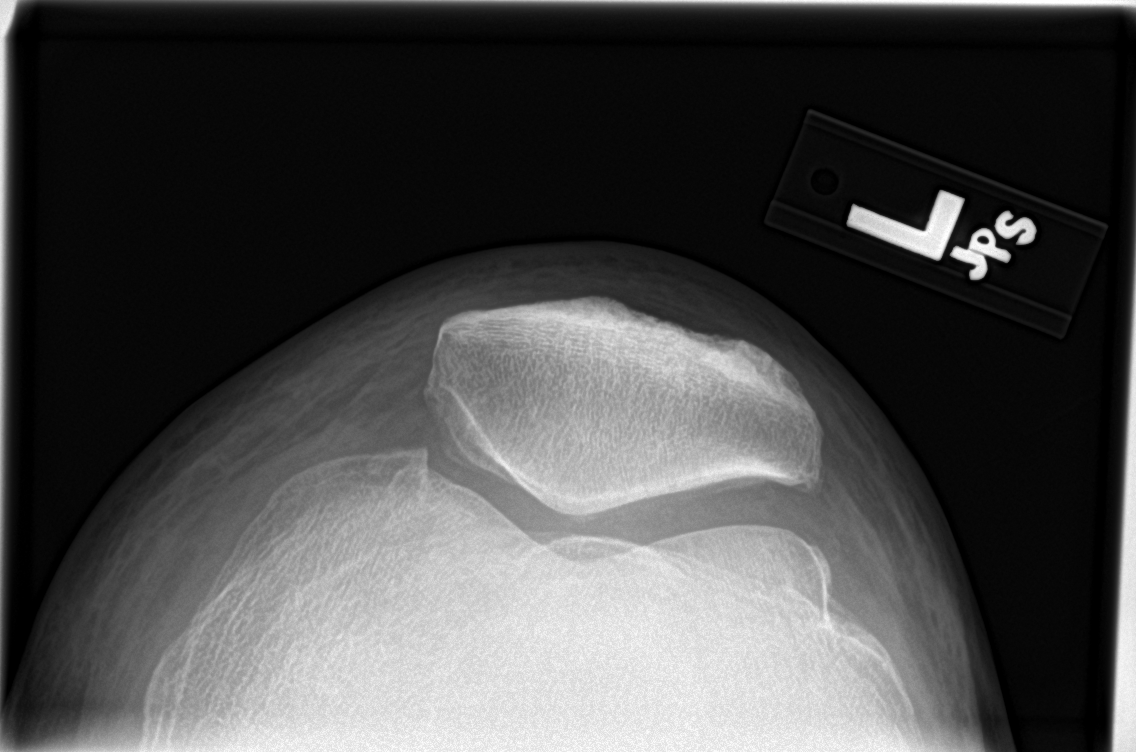

[knee obl]
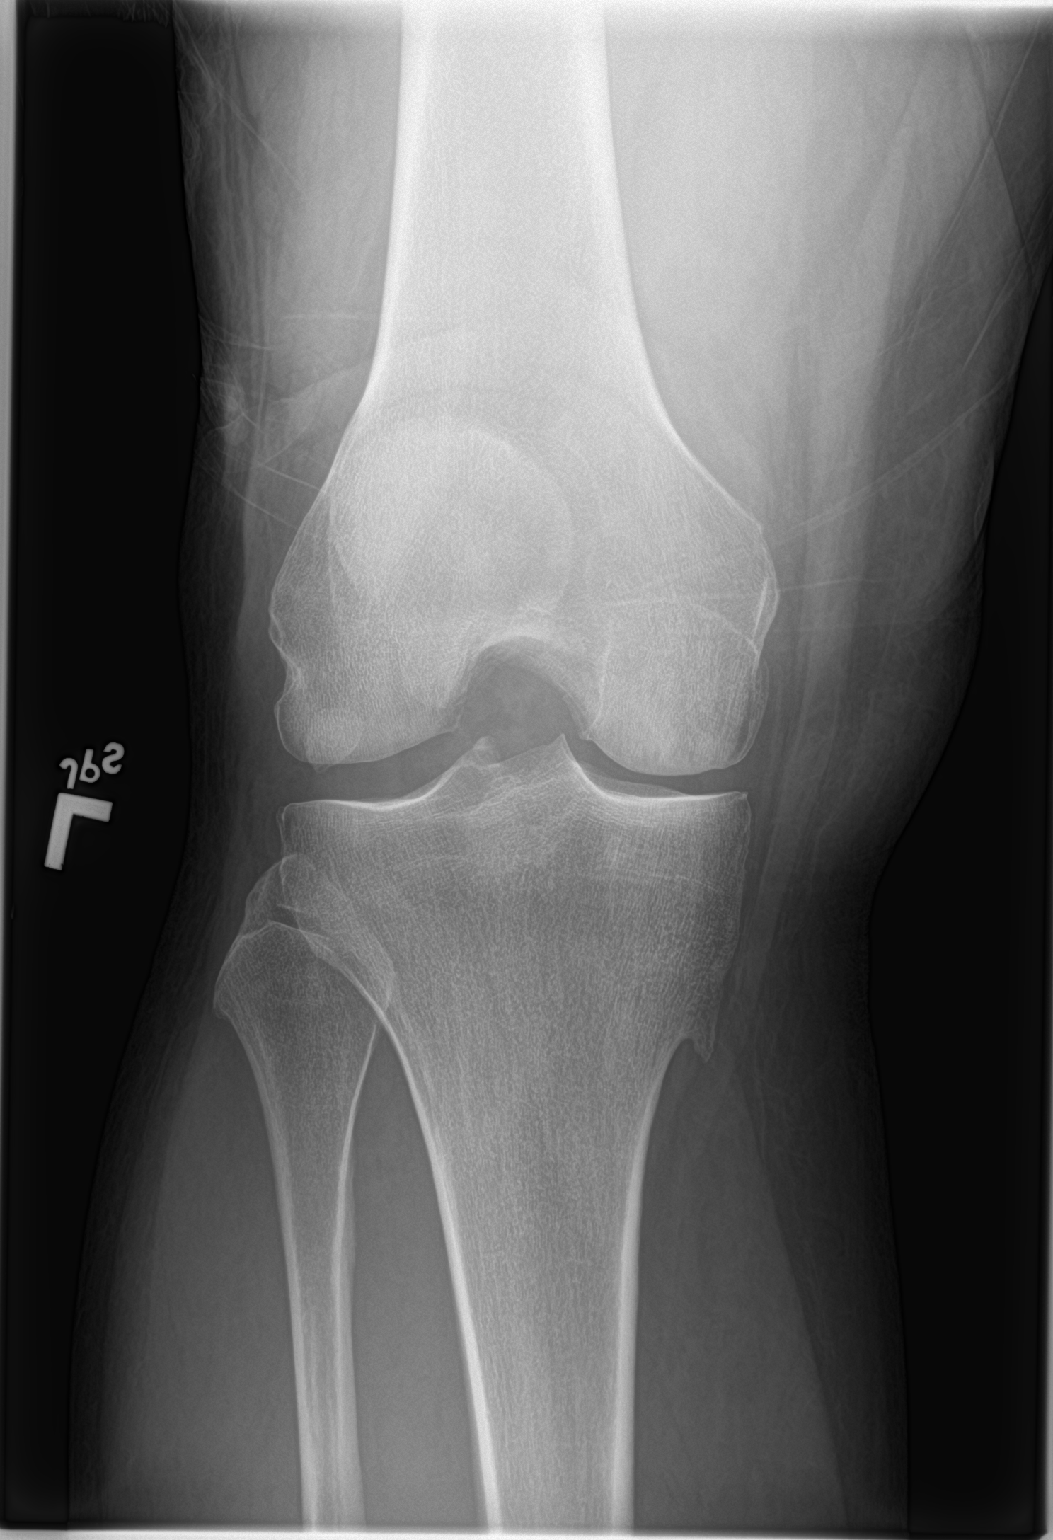

[4 of 4 positions shown; findings below may reference images not displayed]

FINDINGS: Osseous demineralization.

Minimal medial compartment joint space narrowing and mild
patellofemoral irregularity.

Small exostosis identified at the medial margin of the LEFT proximal
tibial metaphysis.

Small knee joint effusion.

No acute fracture, dislocation, or bone destruction.

Comparison AP view of the RIGHT knee demonstrates osseous
demineralization and a large exostosis arising from the medial
aspect of the proximal RIGHT tibial metaphysis.
IMPRESSION: BILATERAL proximal tibial exostoses medially, larger on RIGHT.

Mild degenerative changes LEFT knee with associated knee joint
effusion.

No acute fracture or dislocation LEFT knee.

## 2020-04-13 DIAGNOSIS — E1165 Type 2 diabetes mellitus with hyperglycemia: Secondary | ICD-10-CM | POA: Diagnosis not present

## 2020-04-13 DIAGNOSIS — E7801 Familial hypercholesterolemia: Secondary | ICD-10-CM | POA: Diagnosis not present

## 2020-06-09 NOTE — Progress Notes (Signed)
CARDIOLOGY OFFICE NOTE  Date:  06/15/2020    Connor Reed Date of Birth: [birth year] Medical Record #[id number]  PCP:  Enid Skeens., MD  Cardiologist:  Marisa Cyphers    Chief Complaint  Patient presents with  . Follow-up    Seen for Dr. Marlou Porch    History of Present Illness: Connor Reed is a 60 y.o. male who presents today for a follow up visit. Seen for Dr. Marlou Porch.    He has a known history of CAD with prior MI in July of 2018 with PCI to LAD with return to normal EF. Did have some dizziness and low BP afterwards - ACE was stopped.    Last seen in June by telehealth visit with Dr. Marlou Porch - felt to be doing ok. I saw him this past March - had several concerns - was not sleeping, lung infections, arms feeling "weird" and basically not feeling well. Myoview was updated - this was low risk.   Last seen by me back in October - continued to not feel well - "hurt" all the time - using Xanax with relief - lots of stress. No exertional chest pain.   Comes in today. Here alone. BP up some today. Little better at home at times - even down to 120/80's -  "fudging on his work outs" - but trying to do better so he can get his A1C down - continues with some stress as well which makes an impact. No real chest pain. More limited by his arthritic ailments. No exertional chest pain. Breathing is stable. He was started on Lipitor and just stopped due to some aching in his legs - he is seeing his PCP back for discussion. He says he is committed to working on diabetes and getting this better (last A1C around 8 per his report).    Past Medical History:  Diagnosis Date  . Asthma   . Chronic bronchitis ([place])   . Hypertension     Past Surgical History:  Procedure Laterality Date  . LEFT HEART CATH AND CORONARY ANGIOGRAPHY N/A 12/18/2016   Procedure: Left Heart Cath and Coronary Angiography;  Surgeon: Belva Crome, MD;  Location: [facility];  Service: Cardiovascular;  Laterality:  N/A;     Medications: Current Meds  Medication Sig  . albuterol (VENTOLIN HFA) 108 (90 Base) MCG/ACT inhaler Inhale 2 puffs into the lungs every 4 (four) hours as needed for wheezing or shortness of breath.  Marland Kitchen aspirin 81 MG chewable tablet CHEW AND SWALLOW 1 TABLET BY MOUTH ONCE DAILY  . budesonide-formoterol (SYMBICORT) 80-4.5 MCG/ACT inhaler INHALE 2 PUFFS INTO THE LUNGS TWICE DAILY. RINSE MOUTH AFTER USE  . clonazePAM (KLONOPIN) 0.5 MG tablet Take 0.25-0.5 mg by mouth at bedtime.  . diclofenac Sodium (VOLTAREN) 1 % GEL Apply 4 g topically 4 (four) times daily.  . Fluticasone-Salmeterol (ADVAIR DISKUS) 250-50 MCG/DOSE AEPB Inhale 1 puff into the lungs 2 (two) times daily.  Marland Kitchen glucose blood (ONETOUCH VERIO) test strip Test blood sugar two times a day. Diagnosis code E11.00, non-insulin dependent diabetes  . metFORMIN (GLUCOPHAGE) 1000 MG tablet Take 1,000 mg by mouth 2 (two) times daily with a meal.  . nitroGLYCERIN (NITROSTAT) 0.4 MG SL tablet Place 1 tablet (0.4 mg total) under the tongue every 5 (five) minutes as needed for chest pain.  Glory Rosebush DELICA LANCETS FINE MISC Use to check blood sugar two times daily. Diagnosis code:E11.00     Allergies: Allergies  Allergen Reactions  . Acetaminophen Itching and Rash  . Penicillins Rash, Shortness Of Breath and Swelling    Social History: The patient  reports that he quit smoking about 30 years ago. He has a 20.00 pack-year smoking history. He has never used smokeless tobacco. He reports current alcohol use. He reports that he does not use drugs.   Family History: The patient's family history includes Cancer in his father; Diabetes in his brother; Heart attack in his maternal grandfather.   Review of Systems: Please see the history of present illness.   All other systems are reviewed and negative.   Physical Exam: VS:  BP (!) 144/98   Pulse 87   Ht 6' (1.829 m)   Wt 240 lb (108.9 kg)   SpO2 96%   BMI 32.55 kg/m  .  BMI Body  mass index is 32.55 kg/m.  Wt Readings from Last 3 Encounters:  06/15/20 240 lb (108.9 kg)  03/10/20 234 lb (106.1 kg)  11/12/19 238 lb 12.8 oz (108.3 kg)   BP 130/80 by me.   General: Pleasant. Alert and in no acute distress. He does have heavy boots on.   Cardiac: Regular rate and rhythm. No murmurs, rubs, or gallops. No edema.  Respiratory:  Lungs are clear to auscultation bilaterally with normal work of breathing.  GI: Soft and nontender.  MS: No deformity or atrophy. Gait and ROM intact.  Skin: Warm and dry. Color is normal.  Neuro:  Strength and sensation are intact and no gross focal deficits noted.  Psych: Alert, appropriate and with normal affect.   LABORATORY DATA:  EKG:  EKG is not ordered today.    Lab Results  Component Value Date   WBC 8.8 08/20/2019   HGB 15.6 08/20/2019   HCT 46.3 08/20/2019   PLT 275 08/20/2019   GLUCOSE 141 (H) 08/20/2019   CHOL 189 08/20/2019   TRIG 188 (H) 08/20/2019   HDL 38 (L) 08/20/2019   LDLCALC 118 (H) 08/20/2019   ALT 31 08/20/2019   AST 21 08/20/2019   NA 140 08/20/2019   K 4.2 08/20/2019   CL 104 08/20/2019   CREATININE 1.10 08/20/2019   BUN 14 08/20/2019   CO2 20 08/20/2019   TSH 1.460 08/20/2019   INR 1.09 12/18/2016   HGBA1C 9.3 (A) 11/12/2019     BNP (last 3 results) No results for input(s): BNP in the last 8760 hours.  ProBNP (last 3 results) Recent Labs    08/20/19 1631  PROBNP 234*     Other Studies Reviewed Today:  Myoview Study Highlights 08/2019      Nuclear stress EF: 51%. No significant wall motion abnormalities.  There was no ST segment deviation noted during stress.  Defect 1: There is a small defect of mild severity present in the apical anterior location, small infarct pattern. No ischemia identified.  This is a low risk study. No ischemia identified. Prior LAD stent.   Candee Furbish, MD        Left Heart Cath and Coronary Angiography 2018  Conclusion    Non-ST elevation  anterior wall myocardial infarction with vague ongoing chest pain greater than 24 hours.  Subtotal occlusion thrombotic obstruction in the mid LAD which is also supplied by collaterals from the right coronary.  Successful angioplasty and stenting of the mid LAD from 99.9% with TIMI grade one flow to 0% with TIMI grade 3 flow using Onyx 3.5 x 18 DES postdilated to 3.75 mm in diameter.  Successful  direct stenting of the mid circumflex 80% stenosis to 0% with TIMI grade 3 flow using a 3.5 x 12 mm Onyx deployed at 14 atm.  30-40% proximal to mid RCA which is noted above supplied collaterals to the apical and mid LAD.  Left ventricular systolic dysfunction with anteroapical severe hypokinesis, ejection fraction 40%, and elevated LVEDP consistent with acute combined systolic and diastolic heart failure.   RECOMMENDATIONS:    Aggrastat will be continued for 10 hours.  Aspirin and Plavix 1 year.  Aggressive risk factor modification.  Contemplate discharge within the next 24 hours after guidelines based management of acute combined systolic and diastolic heart failure.      ASSESSMENT & PLAN:     1. CAD - prior MI and prior stent to the LAD - low risk Myoview from March - needs aggressive CV risk factor modification which is challenging for him. Prior low risk Myoview.   2. ICM - his EF by last Myoview at 51%. No worrisome symptoms but long term prognosis tenuous - needs aggressive risk factor modification.   3. HTN - BP recheck by me is fine.   4. Stress/anxiety - still seems to drive a lot of his issues.   5. HLD - has just stopped his Lipitor and is going to discuss with his PCP on return visit.    Current medicines are reviewed with the patient today.  The patient does not have concerns regarding medicines other than what has been noted above.  The following changes have been made:  See above.  Labs/ tests ordered today include:   No orders of the defined types were placed in  this encounter.    Disposition:   FU with Dr. Skains in 6 months. He is aware that I am leaving next month.    Patient is agreeable to this plan and will call if any problems develop in the interim.   Signed: LORI GERHARDT, NP  06/15/2020 3:56 PM  [facility] [address] [place], [place]  [address] Phone: [phone] Fax: [phone]        

## 2020-06-15 ENCOUNTER — Other Ambulatory Visit: Payer: Self-pay

## 2020-06-15 ENCOUNTER — Ambulatory Visit (INDEPENDENT_AMBULATORY_CARE_PROVIDER_SITE_OTHER): Payer: Federal, State, Local not specified - PPO | Admitting: Nurse Practitioner

## 2020-06-15 ENCOUNTER — Encounter: Payer: Self-pay | Admitting: Nurse Practitioner

## 2020-06-15 VITALS — BP 144/98 | HR 87 | Ht 72.0 in | Wt 240.0 lb

## 2020-06-15 DIAGNOSIS — I2510 Atherosclerotic heart disease of native coronary artery without angina pectoris: Secondary | ICD-10-CM | POA: Diagnosis not present

## 2020-06-15 DIAGNOSIS — Z955 Presence of coronary angioplasty implant and graft: Secondary | ICD-10-CM

## 2020-06-15 DIAGNOSIS — E782 Mixed hyperlipidemia: Secondary | ICD-10-CM

## 2020-06-15 DIAGNOSIS — E1100 Type 2 diabetes mellitus with hyperosmolarity without nonketotic hyperglycemic-hyperosmolar coma (NKHHC): Secondary | ICD-10-CM | POA: Diagnosis not present

## 2020-06-15 NOTE — Patient Instructions (Addendum)
After Visit Summary:  We will be checking the following labs today - NONE   Medication Instructions:    Continue with your current medicines.    If you need a refill on your cardiac medications before your next appointment, please call your pharmacy.     Testing/Procedures To Be Arranged:  N/A  Follow-Up:   See Dr. Skains in 6 months.     At [facility], you and your health needs are our priority.  As part of our continuing mission to provide you with exceptional heart care, we have created designated Provider Care Teams.  These Care Teams include your primary Cardiologist (physician) and Advanced Practice Providers (APPs -  Physician Assistants and Nurse Practitioners) who all work together to provide you with the care you need, when you need it.  Special Instructions:  . Stay safe, wash your hands for at least 20 seconds and wear a mask when needed.  . It was good to talk with you today.    Call the [facility] office at [phone] if you have any questions, problems or concerns.       

## 2020-11-09 ENCOUNTER — Ambulatory Visit: Payer: Federal, State, Local not specified - PPO | Admitting: Cardiology

## 2020-11-09 ENCOUNTER — Other Ambulatory Visit: Payer: Self-pay

## 2020-11-09 ENCOUNTER — Encounter: Payer: Self-pay | Admitting: Cardiology

## 2020-11-09 VITALS — BP 120/70 | HR 59 | Ht 72.0 in | Wt 233.0 lb

## 2020-11-09 DIAGNOSIS — E782 Mixed hyperlipidemia: Secondary | ICD-10-CM | POA: Diagnosis not present

## 2020-11-09 DIAGNOSIS — Z955 Presence of coronary angioplasty implant and graft: Secondary | ICD-10-CM

## 2020-11-09 DIAGNOSIS — I2510 Atherosclerotic heart disease of native coronary artery without angina pectoris: Secondary | ICD-10-CM | POA: Diagnosis not present

## 2020-11-09 NOTE — Patient Instructions (Signed)
Medication Instructions:  The current medical regimen is effective;  continue present plan and medications.  *If you need a refill on your cardiac medications before your next appointment, please call your pharmacy*  Follow-Up: At [facility], you and your health needs are our priority.  As part of our continuing mission to provide you with exceptional heart care, we have created designated Provider Care Teams.  These Care Teams include your primary Cardiologist (physician) and Advanced Practice Providers (APPs -  Physician Assistants and Nurse Practitioners) who all work together to provide you with the care you need, when you need it.  We recommend signing up for the patient portal called "MyChart".  Sign up information is provided on this After Visit Summary.  MyChart is used to connect with patients for Virtual Visits (Telemedicine).  Patients are able to view lab/test results, encounter notes, upcoming appointments, etc.  Non-urgent messages can be sent to your provider as well.   To learn more about what you can do with MyChart, go to [email or web address].    Your next appointment:   12 month(s)  The format for your next appointment:   In Person  Provider:   Mark Skains, MD   Thank you for choosing  HeartCare!!      

## 2020-11-09 NOTE — Progress Notes (Signed)
Cardiology Office Note:    Date:  11/09/2020   ID:  Connor Reed, DOB September 04, 1960, MRN [id number]  PCP:  Nonnie Done., MD   [facility] Providers Cardiologist:  Donato Schultz, MD     Referring MD: Nonnie Done., MD    History of Present Illness:    Connor Reed is a 60 y.o. male here for a follow-up for hypertension and CAD.  Previous Visit: Connor Reed is a 60 y.o. male who presents today for a follow up visit. Seen for Dr. Anne Fu.   He has a known history of CAD with prior MI in July of 2018 with PCI to LAD with return to normal EF. Did have some dizziness and low BP afterwards - ACE was stopped.   Last seen in June by telehealth visit with Dr. Anne Fu - felt to be doing ok.I saw him this past March - had several concerns - was not sleeping, lung infections, arms feeling "weird" and basically not feeling well. Myoview was updated - this was low risk.   Last seen by me back in October - continued to not feel well - "hurt" all the time - using Xanax with relief - lots of stress. No exertional chest pain.   Comes in today. Here alone. BP up some today. Little better at home at times - even down to 120/80's -  "fudging on his work outs" - but trying to do better so he can get his A1C down - continues with some stress as well which makes an impact. No real chest pain. More limited by his arthritic ailments. No exertional chest pain. Breathing is stable. He was started on Lipitor and just stopped due to some aching in his legs - he is seeing his PCP back for discussion. He says he is committed to working on diabetes and getting this better (last A1C around 8 per his report).    Today, he is feeling well. He reports his main goal is to control his Type II diabetes. He admits to being stressed but he is managing and working on incorporating more exercise and a better diet.  He denies having any exertional shortness of breath, chest pain, tightness, or pressure. He denies any LE  edema, PND, orthopnea, lightheadedness, or syncopal episodes.   Past Medical History:  Diagnosis Date  . Asthma   . Chronic bronchitis (HCC)   . Hypertension     Past Surgical History:  Procedure Laterality Date  . LEFT HEART CATH AND CORONARY ANGIOGRAPHY N/A 12/18/2016   Procedure: Left Heart Cath and Coronary Angiography;  Surgeon: Lyn Records, MD;  Location: [facility] INVASIVE CV LAB;  Service: Cardiovascular;  Laterality: N/A;    Current Medications: Current Meds  Medication Sig  . albuterol (VENTOLIN HFA) 108 (90 Base) MCG/ACT inhaler Inhale 2 puffs into the lungs every 4 (four) hours as needed for wheezing or shortness of breath.  Marland Kitchen aspirin 81 MG chewable tablet CHEW AND SWALLOW 1 TABLET BY MOUTH ONCE DAILY  . budesonide-formoterol (SYMBICORT) 80-4.5 MCG/ACT inhaler INHALE 2 PUFFS INTO THE LUNGS TWICE DAILY. RINSE MOUTH AFTER USE  . clonazePAM (KLONOPIN) 0.5 MG tablet Take 0.25-0.5 mg by mouth at bedtime.  . diclofenac Sodium (VOLTAREN) 1 % GEL Apply 4 g topically 4 (four) times daily.  . Fluticasone-Salmeterol (ADVAIR DISKUS) 250-50 MCG/DOSE AEPB Inhale 1 puff into the lungs 2 (two) times daily.  Marland Kitchen glucose blood (ONETOUCH VERIO) test strip Test blood sugar two times a day. Diagnosis  code E11.00, non-insulin dependent diabetes  . metFORMIN (GLUCOPHAGE) 1000 MG tablet Take 1,000 mg by mouth 2 (two) times daily with a meal.  . nitroGLYCERIN (NITROSTAT) 0.4 MG SL tablet Place 1 tablet (0.4 mg total) under the tongue every 5 (five) minutes as needed for chest pain.  Letta Pate DELICA LANCETS FINE MISC Use to check blood sugar two times daily. Diagnosis code:E11.00     Allergies:   Acetaminophen and Penicillins   Social History   Socioeconomic History  . Marital status: Married    Spouse name: Not on file  . Number of children: Not on file  . Years of education: Not on file  . Highest education level: Not on file  Occupational History  . Not on file  Tobacco Use  . Smoking  status: Former Smoker    Packs/day: 1.00    Years: 20.00    Pack years: 20.00    Quit date: 06/19/1990    Years since quitting: 30.4  . Smokeless tobacco: Never Used  Substance and Sexual Activity  . Alcohol use: Yes    Comment: drank 12 pack yesterday; typically 1 x per week  . Drug use: No  . Sexual activity: Not on file  Other Topics Concern  . Not on file  Social History Narrative  . Not on file   Social Determinants of Health   Financial Resource Strain: Not on file  Food Insecurity: Not on file  Transportation Needs: Not on file  Physical Activity: Not on file  Stress: Not on file  Social Connections: Not on file     Family History: The patient'sfamily history includes Cancer in his father; Diabetes in his brother; Heart attack in his maternal grandfather.  ROS:   Please see the history of present illness. All other systems reviewed and are negative.  EKGs/Labs/Other Studies Reviewed:    The following studies were reviewed today: Lexiscan 08/26/2019 Study Highlights  Nuclear stress EF: 51%. No significant wall motion abnormalities.  There was no ST segment deviation noted during stress.  Defect 1: There is a small defect of mild severity present in the apical anterior location, small infarct pattern. No ischemia identified.  This is a low risk study. No ischemia identified. Prior LAD stent.  Echo 12/20/2016 - Left ventricle: The cavity size was normal. Wall thickness was  normal. Systolic function was normal. The estimated ejection  fraction was in the range of 55% to 60%. Left ventricular  diastolic function parameters were normal.   Left Heart Cath 12/18/2016    DG Chest 12/17/2016 IMPRESSION: No active cardiopulmonary disease.  EKG:  11/09/2020- sinus bradycardia 59 bpm  Recent Labs: No results found for requested labs within last 8760 hours.  Recent Lipid Panel    Component Value Date/Time   CHOL 189 08/20/2019 1631   TRIG 188 (H)  08/20/2019 1631   HDL 38 (L) 08/20/2019 1631   CHOLHDL 5.0 08/20/2019 1631   CHOLHDL 5.2 12/18/2016 0317   VLDL 48 (H) 12/18/2016 0317   LDLCALC 118 (H) 08/20/2019 1631     Risk Assessment/Calculations:      Physical Exam:    VS:  BP 120/70 (BP Location: Left Arm, Patient Position: Sitting, Cuff Size: Normal)   Pulse (!) 59   Ht 6' (1.829 m)   Wt 233 lb (105.7 kg)   SpO2 95%   BMI 31.60 kg/m     Wt Readings from Last 3 Encounters:  11/09/20 233 lb (105.7 kg)  06/15/20 240 lb (108.9  kg)  03/10/20 234 lb (106.1 kg)     EUM:PNTI nourished, well developed in no acute distress HEENT: Normal NECK: No JVD; No carotid bruits LYMPHATICS: No lymphadenopathy CARDIAC: RRR, no murmurs, rubs, gallops RESPIRATORY:  Clear to auscultation without rales, wheezing or rhonchi  ABDOMEN: Soft, non-tender, non-distended MUSCULOSKELETAL:  No edema; No deformity  SKIN: Warm and dry NEUROLOGIC:  Alert and oriented x 3 PSYCHIATRIC:  Normal affect   ASSESSMENT:    1. Coronary artery disease involving native coronary artery of native heart without angina pectoris   2. S/P coronary artery stent placement   3. Mixed hyperlipidemia    PLAN:    In order of problems listed above:  1. CAD  Prior stent to the LAD as described above in cardiac catheterization.  Overall doing well.  Continue with medical management.  Continue with aspirin 81 mg once a day.  Optimally should be on statin therapy but he had stopped this previously, atorvastatin.    2. ICM  Last EF at nuclear stress test was 51%.  Doing well.  3. HTN  120/70 today excellent control.  He is working on diet and exercise.  Needs to get his diabetes under better control he states.  His last hemoglobin A1c was 9.3 from outside labs.  4. Stress/anxiety  States that he recently had a divorce.  Is feeling better.  5. HLD Stopped his atorvastatin.  Continue to encourage.  Especially with diabetes.   1 yr   Medication  Adjustments/Labs and Tests Ordered: Current medicines are reviewed at length with the patient today.  Concerns regarding medicines are outlined above.  Orders Placed This Encounter  Procedures  . EKG 12-Lead   No orders of the defined types were placed in this encounter.   Patient Instructions  Medication Instructions:  The current medical regimen is effective;  continue present plan and medications.  *If you need a refill on your cardiac medications before your next appointment, please call your pharmacy*  Follow-Up: At [facility], you and your health needs are our priority.  As part of our continuing mission to provide you with exceptional heart care, we have created designated Provider Care Teams.  These Care Teams include your primary Cardiologist (physician) and Advanced Practice Providers (APPs -  Physician Assistants and Nurse Practitioners) who all work together to provide you with the care you need, when you need it.  We recommend signing up for the patient portal called "MyChart".  Sign up information is provided on this After Visit Summary.  MyChart is used to connect with patients for Virtual Visits (Telemedicine).  Patients are able to view lab/test results, encounter notes, upcoming appointments, etc.  Non-urgent messages can be sent to your provider as well.   To learn more about what you can do with MyChart, go to [email or web address].    Your next appointment:   12 month(s)  The format for your next appointment:   In Person  Provider:   Donato Schultz, MD   Thank you for choosing [facility]!!         I,Alexis Bryant,acting as a scribe for Donato Schultz, MD.,have documented all relevant documentation on the behalf of Donato Schultz, MD,as directed by  Donato Schultz, MD while in the presence of Donato Schultz, MD.  I, Donato Schultz, MD, have reviewed all documentation for this visit. The documentation on 11/09/20 for the exam, diagnosis, procedures, and  orders are all accurate and complete.   Signed, Donato Schultz, MD  11/09/2020  4:59 PM     [facility]

## 2021-11-04 ENCOUNTER — Telehealth: Payer: Self-pay | Admitting: Cardiology

## 2021-11-04 NOTE — Telephone Encounter (Signed)
Spoke with pt who is reporting he is feeling poorly because his BS is high and he feels like his BP is low.  He states he feels like he can hardly go when his BP gets to where it is today (132/71)  he reports he is going to not take his Lisinopril for awhile.  Advised against discontinuing blood pressure medication and encouraged him to work on his blood sugar control.  Reviewed decreasing intake of foods higher in carbohydrates (white bread, white sugar, pasta, potatoes  etc) pt states understanding.  Scheduled pt's f/u appt with Dr Anne Fu 6/26.  Advised to contact the office if further questions/concerns prior to his appt.  Encouraged him to f/u with PCP regarding DM.

## 2021-11-04 NOTE — Telephone Encounter (Signed)
Pt c/o BP issue: STAT if pt c/o blurred vision, one-sided weakness or slurred speech  1. What are your last 5 BP readings? 132/72; 150/90  2. Are you having any other symptoms (ex. Dizziness, headache, blurred vision, passed out)? Pain underneath shoulder blade   3. What is your BP issue?  Patient states he going to stop taking his bp medication to see if that helps.

## 2021-11-28 ENCOUNTER — Ambulatory Visit (HOSPITAL_BASED_OUTPATIENT_CLINIC_OR_DEPARTMENT_OTHER): Payer: Federal, State, Local not specified - PPO | Admitting: Cardiology

## 2021-11-28 ENCOUNTER — Encounter (HOSPITAL_BASED_OUTPATIENT_CLINIC_OR_DEPARTMENT_OTHER): Payer: Self-pay | Admitting: Cardiology

## 2021-11-28 VITALS — BP 140/90 | Ht 72.0 in | Wt 230.0 lb

## 2021-11-28 DIAGNOSIS — E119 Type 2 diabetes mellitus without complications: Secondary | ICD-10-CM | POA: Diagnosis not present

## 2021-11-28 DIAGNOSIS — E782 Mixed hyperlipidemia: Secondary | ICD-10-CM

## 2021-11-28 DIAGNOSIS — F419 Anxiety disorder, unspecified: Secondary | ICD-10-CM

## 2021-11-28 DIAGNOSIS — I2510 Atherosclerotic heart disease of native coronary artery without angina pectoris: Secondary | ICD-10-CM | POA: Diagnosis not present

## 2021-11-28 DIAGNOSIS — E7800 Pure hypercholesterolemia, unspecified: Secondary | ICD-10-CM | POA: Insufficient documentation

## 2021-11-28 DIAGNOSIS — I10 Essential (primary) hypertension: Secondary | ICD-10-CM | POA: Diagnosis not present

## 2021-11-28 MED ORDER — IRBESARTAN 75 MG PO TABS: 75.0000 mg | ORAL_TABLET | Freq: Every day | ORAL | 3 refills | Status: DC

## 2021-11-28 NOTE — Assessment & Plan Note (Signed)
He states that he is now taking his glyburide with his metformin.  His blood sugars have been in the 200 ranges.  A bit high.  In the past his hemoglobin A1c has been down into the 7 range.  Discussed renal protection with angiotensin receptor blocker.

## 2021-11-28 NOTE — Assessment & Plan Note (Signed)
He has been off of the lisinopril.  We will go ahead and give him irbesartan 75 mg once a day, angiotensin receptor blocker.  This will also be renal protective.  He has a single kidney.  When he was 61 years old he had a kidney infection and now has a single kidney.  Large kidney he states.  Check basic metabolic profile in the future.

## 2021-11-28 NOTE — Assessment & Plan Note (Signed)
Asked about low-dose Xanax.  He was given another medication by his primary care physician.  He will continue to discuss with his primary team.

## 2021-11-28 NOTE — Assessment & Plan Note (Signed)
Prior LDL 112.  LDL goal less than 70, would not be unreasonable to be less than 55 given prior STEMI and diabetic.  Discussing with lipid clinic.  Intolerant to statins.

## 2022-01-03 ENCOUNTER — Ambulatory Visit: Payer: Federal, State, Local not specified - PPO | Admitting: Pharmacist

## 2022-01-03 DIAGNOSIS — I2510 Atherosclerotic heart disease of native coronary artery without angina pectoris: Secondary | ICD-10-CM | POA: Diagnosis not present

## 2022-01-03 DIAGNOSIS — E7800 Pure hypercholesterolemia, unspecified: Secondary | ICD-10-CM

## 2022-01-03 NOTE — Patient Instructions (Signed)
We will submit a prior authorization for Repatha. I will call you once I hear back Please try to decrease your intake of refined carbs (biscuits, bread, crackers) Try to increase your vegetable intake Please call me at [phone] with any questions

## 2022-01-03 NOTE — Progress Notes (Signed)
Patient ID: BLAYNE FRANKIE                 DOB: 05-02-1961                    MRN: [id number]     HPI: Connor Reed is a 61 y.o. male patient referred to lipid clinic by Dr. Anne Fu. PMH is significant for hypertension, CAD, 2018 STEMI with LAD stent, DM and single kidney. Patient previously stopped statin due to muscle pains. He has struggled to control his blood sugars.  Patient presents to lipid clinic today. He is not currently taking any medication for his cholesterol. His PCP sent in fenofibrate, but he has not started it yet. He is not taking his blood pressure medications. Concerned about things hurting his kidney. I explained to him that if he wants to protect his kidney, the best thing he can do is control his blood pressure and blood sugar. Thinks his BP medications make him tired. Has not picked up the irbesartan Dr. Anne Fu prescribed. PCP also increased his sulfonylurea but he thinks its too much.   Current Medications: none Intolerances: atorvastatin 40, 80mg , rosuvastatin 5mg  daily (muscle pains) Risk Factors: DM, HTN, CAD LDL-C goal: <55  Diet:  Breakfast: sausage biscuit or pack of nabs, 2-3 cups of de-caf coffee Lunch: hamburger, hot dog Dinner: sometimes does eat dinner, last nigh nachos Drinks: water soda every now and then  Exercise: physical job, no exercise outside of work lately  Family History: The patient'sfamily history includes Cancer in his father; Diabetes in his brother; Heart attack in his maternal grandfather.  Social History: quit smoking in 1992, 2-3 beers per day  Labs: 08/20/19 TC 189 TG 188 HDL 38 LDL-C 118 (no therapy)  Past Medical History:  Diagnosis Date   Asthma    Chronic bronchitis (HCC)    Hypertension     Current Outpatient Medications on File Prior to Visit  Medication Sig Dispense Refill   albuterol (VENTOLIN HFA) 108 (90 Base) MCG/ACT inhaler Inhale 2 puffs into the lungs every 4 (four) hours as needed for wheezing or shortness of  breath. 8 g 3   aspirin 81 MG chewable tablet CHEW AND SWALLOW 1 TABLET BY MOUTH ONCE DAILY 30 tablet 10   budesonide-formoterol (SYMBICORT) 80-4.5 MCG/ACT inhaler INHALE 2 PUFFS INTO THE LUNGS TWICE DAILY. RINSE MOUTH AFTER USE 30.6 g 1   clonazePAM (KLONOPIN) 0.5 MG tablet Take 0.25-0.5 mg by mouth at bedtime.     diclofenac Sodium (VOLTAREN) 1 % GEL Apply 4 g topically 4 (four) times daily. 50 g 0   Fluticasone-Salmeterol (ADVAIR DISKUS) 250-50 MCG/DOSE AEPB Inhale 1 puff into the lungs 2 (two) times daily. 60 each 3   glucose blood (ONETOUCH VERIO) test strip Test blood sugar two times a day. Diagnosis code E11.00, non-insulin dependent diabetes 100 each 3   glyBURIDE (DIABETA) 1.25 MG tablet Take 1.25 mg by mouth daily with breakfast.     irbesartan (AVAPRO) 75 MG tablet Take 1 tablet (75 mg total) by mouth daily. 90 tablet 3   metFORMIN (GLUCOPHAGE) 1000 MG tablet Take 1,000 mg by mouth 2 (two) times daily with a meal.     nitroGLYCERIN (NITROSTAT) 0.4 MG SL tablet Place 1 tablet (0.4 mg total) under the tongue every 5 (five) minutes as needed for chest pain. 25 tablet 3   ONETOUCH DELICA LANCETS FINE MISC Use to check blood sugar two times daily. Diagnosis code:E11.00 100 each 3  No current facility-administered medications on file prior to visit.    Allergies  Allergen Reactions   Acetaminophen Itching and Rash   Penicillins Rash, Shortness Of Breath and Swelling    Assessment/Plan:  1. Hyperlipidemia - LDL-C is above goal of <55. Discussed PCKS9i. Patient is willing to try. Reviewed injection technique, cost and side effects. Will submit prior authorization for Repatha. Patient will need copay card once approved and labs scheduled.  Attempted to discuss diet and exercise with patient. He has a physical job. Use to walk at night, says he is going to start again. Patient was resistant to change around his diet which is very high in processed meats and refined grains. Encouraged him  to eat more vegetables and decrease alcohol intake.  I encouraged him to take his BP medication or work with MD to find a BP medication that doesn't make him feel poorly. Explained again the importance of having a BP <130/80.   Thank you,   Olene Floss, Pharm.D, BCPS, CPP [facility]  [address], [place], [place] [address]  Phone: [phone]; Fax: [phone]

## 2022-01-05 ENCOUNTER — Telehealth: Payer: Self-pay | Admitting: Pharmacist

## 2022-01-05 DIAGNOSIS — E7800 Pure hypercholesterolemia, unspecified: Secondary | ICD-10-CM

## 2022-01-05 NOTE — Telephone Encounter (Signed)
Called PCP for updated labs. PA for Repatha submitted  (Key: [id number])

## 2022-01-06 MED ORDER — REPATHA SURECLICK 140 MG/ML ~~LOC~~ SOAJ: 1.0000 | SUBCUTANEOUS | 11 refills | Status: DC

## 2022-01-06 NOTE — Telephone Encounter (Signed)
The prior authorization for Repatha has been approved and copay card has been obtained.  We sent Repatha prescription to the patient's [facility] in [place]. We have scheduled follow up labs for November 3rd.   Repatha copay card:  RxBin: [id number] RxPCN: [id number] RxGrp: [id number] ID: [id number]

## 2022-01-06 NOTE — Telephone Encounter (Addendum)
Repatha approved through 01/05/23. Rx sent to pharmacy.  Pt will need labs scheduled

## 2022-04-07 ENCOUNTER — Other Ambulatory Visit: Payer: Federal, State, Local not specified - PPO

## 2022-05-10 ENCOUNTER — Ambulatory Visit: Payer: Federal, State, Local not specified - PPO | Attending: Cardiology

## 2022-05-10 DIAGNOSIS — E7800 Pure hypercholesterolemia, unspecified: Secondary | ICD-10-CM

## 2022-05-11 LAB — LIPID PANEL
Chol/HDL Ratio: 3.1 ratio (ref 0.0–5.0)
Cholesterol, Total: 115 mg/dL (ref 100–199)
HDL: 37 mg/dL — ABNORMAL LOW (ref >39)
LDL Chol Calc (NIH): 45 mg/dL (ref 0–99)
Triglycerides: 201 mg/dL — ABNORMAL HIGH (ref 0–149)
VLDL Cholesterol Cal: 33 mg/dL (ref 5–40)

## 2022-05-11 LAB — APOLIPOPROTEIN B: Apolipoprotein B: 65 mg/dL (ref <90)

## 2022-10-13 ENCOUNTER — Encounter: Payer: Self-pay | Admitting: Cardiology

## 2022-10-13 ENCOUNTER — Ambulatory Visit: Payer: Federal, State, Local not specified - PPO | Attending: Cardiology | Admitting: Cardiology

## 2022-10-13 VITALS — BP 135/87 | HR 78 | Ht 72.0 in | Wt 242.0 lb

## 2022-10-13 DIAGNOSIS — R0602 Shortness of breath: Secondary | ICD-10-CM

## 2022-10-13 DIAGNOSIS — R072 Precordial pain: Secondary | ICD-10-CM | POA: Diagnosis not present

## 2022-10-13 DIAGNOSIS — Z01812 Encounter for preprocedural laboratory examination: Secondary | ICD-10-CM | POA: Diagnosis not present

## 2022-10-13 MED ORDER — DAPAGLIFLOZIN PROPANEDIOL 10 MG PO TABS: 10.0000 mg | ORAL_TABLET | Freq: Every day | ORAL | 11 refills | Status: DC

## 2022-10-13 NOTE — Patient Instructions (Signed)
Medication Instructions:  Please start Farxiga 10 mg once daily. Continue all other medications as listed.  *If you need a refill on your cardiac medications before your next appointment, please call your pharmacy*   Testing/Procedures: Your physician has requested that you have an echocardiogram. Echocardiography is a painless test that uses sound waves to create images of your heart. It provides your doctor with information about the size and shape of your heart and how well your heart's chambers and valves are working. This procedure takes approximately one hour. There are no restrictions for this procedure. Please do NOT wear cologne, perfume, aftershave, or lotions (deodorant is allowed). Please arrive 15 minutes prior to your appointment time.   How to Prepare for Your Cardiac PET/CT Stress Test:  1. Please do not take these medications before your test:   Medications that may interfere with the cardiac pharmacological stress agent (ex. nitrates - including erectile dysfunction medications, isosorbide mononitrate, tamulosin or beta-blockers) the day of the exam. (Erectile dysfunction medication should be held for at least 72 hrs prior to test) Theophylline containing medications for 12 hours. Dipyridamole 48 hours prior to the test. Your remaining medications may be taken with water.  2. Nothing to eat or drink, except water, 3 hours prior to arrival time.   NO caffeine/decaffeinated products, or chocolate 12 hours prior to arrival.  3. NO perfume, cologne or lotion  4. Total time is 1 to 2 hours; you may want to bring reading material for the waiting time.  5. Please report to Radiology at the [facility] Main Entrance 30 minutes early for your test.  [address] [place], [place] [address]  Diabetic Preparation:  Hold oral medications. You may take NPH and Lantus insulin. Do not take Humalog or Humulin R (Regular Insulin) the day of your test. Check blood  sugars prior to leaving the house. If able to eat breakfast prior to 3 hour fasting, you may take all medications, including your insulin, Do not worry if you miss your breakfast dose of insulin - start at your next meal.  IF YOU THINK YOU MAY BE PREGNANT, OR ARE NURSING PLEASE INFORM THE TECHNOLOGIST.  In preparation for your appointment, medication and supplies will be purchased.  Appointment availability is limited, so if you need to cancel or reschedule, please call the Radiology Department at [phone]  24 hours in advance to avoid a cancellation fee of $100.00  What to Expect After you Arrive:  Once you arrive and check in for your appointment, you will be taken to a preparation room within the Radiology Department.  A technologist or Nurse will obtain your medical history, verify that you are correctly prepped for the exam, and explain the procedure.  Afterwards,  an IV will be started in your arm and electrodes will be placed on your skin for EKG monitoring during the stress portion of the exam. Then you will be escorted to the PET/CT scanner.  There, staff will get you positioned on the scanner and obtain a blood pressure and EKG.  During the exam, you will continue to be connected to the EKG and blood pressure machines.  A small, safe amount of a radioactive tracer will be injected in your IV to obtain a series of pictures of your heart along with an injection of a stress agent.    After your Exam:  It is recommended that you eat a meal and drink a caffeinated beverage to counter act any effects of  the stress agent.  Drink plenty of fluids for the remainder of the day and urinate frequently for the first couple of hours after the exam.  Your doctor will inform you of your test results within 7-10 business days.  For questions about your test or how to prepare for your test, please call: Rockwell Alexandria, Cardiac Imaging Nurse Navigator  Larey Brick, Cardiac Imaging Nurse  Navigator Office: [phone]   Follow-Up: At [facility], you and your health needs are our priority.  As part of our continuing mission to provide you with exceptional heart care, we have created designated Provider Care Teams.  These Care Teams include your primary Cardiologist (physician) and Advanced Practice Providers (APPs -  Physician Assistants and Nurse Practitioners) who all work together to provide you with the care you need, when you need it.  We recommend signing up for the patient portal called "MyChart".  Sign up information is provided on this After Visit Summary.  MyChart is used to connect with patients for Virtual Visits (Telemedicine).  Patients are able to view lab/test results, encounter notes, upcoming appointments, etc.  Non-urgent messages can be sent to your provider as well.   To learn more about what you can do with MyChart, go to [email or web address].    Your next appointment:   6 month(s)  Provider:   Jari Favre, PA-C, Robin Searing, NP, Jacolyn Reedy, PA-C, Eligha Bridegroom, NP, or Tereso Newcomer, PA-C

## 2022-10-13 NOTE — Progress Notes (Signed)
Cardiology Office Note:    Date:  10/13/2022   ID:  Connor Reed, DOB 20-Oct-1960, MRN [id number]  PCP:  Nonnie Done., MD   [facility] Providers Cardiologist:  Donato Schultz, MD     Referring MD: Nonnie Done., MD    History of Present Illness:    Connor Reed is a 62 y.o. male here for the follow-up of hypertension and CAD, 2018 STEMI with LAD stent.  Has single kidney, diabetes, statin intolerance, hypertension.  He has been feeling more short of breath.  Has been diagnosed with chronic bronchitis and has taken prednisone.  He also has sensation of chest tightness with exertion and occasionally.  Concerned about his coronary artery disease.  He has tried Jardiance in the past and he was intolerant.  Previously, ACE inhibitor was stopped following his MI because of hypotension.   Been SOB with conversations   Past Medical History:  Diagnosis Date   Asthma    Chronic bronchitis (HCC)    Hypertension     Past Surgical History:  Procedure Laterality Date   LEFT HEART CATH AND CORONARY ANGIOGRAPHY N/A 12/18/2016   Procedure: Left Heart Cath and Coronary Angiography;  Surgeon: Lyn Records, MD;  Location: [facility] INVASIVE CV LAB;  Service: Cardiovascular;  Laterality: N/A;    Current Medications: Current Meds  Medication Sig   albuterol (VENTOLIN HFA) 108 (90 Base) MCG/ACT inhaler Inhale 2 puffs into the lungs every 4 (four) hours as needed for wheezing or shortness of breath.   aspirin 81 MG chewable tablet CHEW AND SWALLOW 1 TABLET BY MOUTH ONCE DAILY   clonazePAM (KLONOPIN) 0.5 MG tablet Take 0.25-0.5 mg by mouth at bedtime.   dapagliflozin propanediol (FARXIGA) 10 MG TABS tablet Take 1 tablet (10 mg total) by mouth daily before breakfast.   diclofenac Sodium (VOLTAREN) 1 % GEL Apply 4 g topically 4 (four) times daily.   Evolocumab (REPATHA SURECLICK) 140 MG/ML SOAJ Inject 1 Pen into the skin every 14 (fourteen) days.   Fluticasone-Salmeterol (ADVAIR DISKUS)  250-50 MCG/DOSE AEPB Inhale 1 puff into the lungs 2 (two) times daily.   glucose blood (ONETOUCH VERIO) test strip Test blood sugar two times a day. Diagnosis code E11.00, non-insulin dependent diabetes   glyBURIDE (DIABETA) 1.25 MG tablet Take 1.25 mg by mouth daily with breakfast.   metFORMIN (GLUCOPHAGE) 1000 MG tablet Take 1,000 mg by mouth 2 (two) times daily with a meal.   nitroGLYCERIN (NITROSTAT) 0.4 MG SL tablet Place 1 tablet (0.4 mg total) under the tongue every 5 (five) minutes as needed for chest pain.   ONETOUCH DELICA LANCETS FINE MISC Use to check blood sugar two times daily. Diagnosis code:E11.00     Allergies:   Acetaminophen and Penicillins   Social History   Socioeconomic History   Marital status: Married    Spouse name: Not on file   Number of children: Not on file   Years of education: Not on file   Highest education level: Not on file  Occupational History   Not on file  Tobacco Use   Smoking status: Former    Packs/day: 1.00    Years: 20.00    Additional pack years: 0.00    Total pack years: 20.00    Types: Cigarettes    Quit date: 06/19/1990    Years since quitting: 32.3   Smokeless tobacco: Never  Substance and Sexual Activity   Alcohol use: Yes    Comment: drank 12 pack yesterday; typically  1 x per week   Drug use: No   Sexual activity: Not on file  Other Topics Concern   Not on file  Social History Narrative   Not on file   Social Determinants of Health   Financial Resource Strain: Not on file  Food Insecurity: Not on file  Transportation Needs: Not on file  Physical Activity: Not on file  Stress: Not on file  Social Connections: Not on file     Family History: The patient'sfamily history includes Cancer in his father; Diabetes in his brother; Heart attack in his maternal grandfather.  ROS:   Please see the history of present illness.  All other systems reviewed and are negative.  EKGs/Labs/Other Studies Reviewed:    The following  studies were reviewed today:  Lexiscan 08/26/2019 Study Highlights Nuclear stress EF: 51%. No significant wall motion abnormalities. There was no ST segment deviation noted during stress. Defect 1: There is a small defect of mild severity present in the apical anterior location, small infarct pattern. No ischemia identified. This is a low risk study. No ischemia identified. Prior LAD stent.  Echo 12/20/2016 - Left ventricle: The cavity size was normal. Wall thickness was    normal. Systolic function was normal. The estimated ejection    fraction was in the range of 55% to 60%. Left ventricular    diastolic function parameters were normal.   Left Heart Cath 12/18/2016 Non-ST elevation anterior wall myocardial infarction with vague ongoing chest pain greater than 24 hours. Subtotal occlusion thrombotic obstruction in the mid LAD which is also supplied by collaterals from the right coronary. Successful angioplasty and stenting of the mid LAD from 99.9% with TIMI grade one flow to 0% with TIMI grade 3 flow using Onyx 3.5 x 18 DES postdilated to 3.75 mm in diameter. Successful direct stenting of the mid circumflex 80% stenosis to 0% with TIMI grade 3 flow using a 3.5 x 12 mm Onyx deployed at 14 atm. 30-40% proximal to mid RCA which is noted above supplied collaterals to the apical and mid LAD. Left ventricular systolic dysfunction with anteroapical severe hypokinesis, ejection fraction 40%, and elevated LVEDP consistent with acute combined systolic and diastolic heart failure.   RECOMMENDATIONS:   Aggrastat will be continued for 10 hours. Aspirin and Plavix 1 year. Aggressive risk factor modification. Contemplate discharge within the next 24 hours after guidelines based management of acute combined systolic and diastolic heart failure.    DG Chest 12/17/2016 IMPRESSION: No active cardiopulmonary disease.  EKG:   EKG is personally reviewed. 10/13/2022: Normal sinus rhythm 78 11/28/2021:   Sinus rhythm. Rate 67 bpm. 11/09/2020- sinus bradycardia 59 bpm  Recent Labs: No results found for requested labs within last 365 days.   Recent Lipid Panel    Component Value Date/Time   CHOL 115 05/10/2022 1013   TRIG 201 (H) 05/10/2022 1013   HDL 37 (L) 05/10/2022 1013   CHOLHDL 3.1 05/10/2022 1013   CHOLHDL 5.2 12/18/2016 0317   VLDL 48 (H) 12/18/2016 0317   LDLCALC 45 05/10/2022 1013     Risk Assessment/Calculations:      Physical Exam:    VS:  BP 135/87 (BP Location: Left Arm, Patient Position: Sitting, Cuff Size: Normal)   Pulse 78   Ht 6' (1.829 m)   Wt 242 lb (109.8 kg)   BMI 32.82 kg/m     Wt Readings from Last 3 Encounters:  10/13/22 242 lb (109.8 kg)  11/28/21 230 lb (104.3 kg)  11/09/20  233 lb (105.7 kg)     GEN: Well nourished, well developed, in no acute distress HEENT: normal Neck: no JVD, carotid bruits, or masses Cardiac: RRR; no murmurs, rubs, or gallops,no edema  Respiratory:  clear to auscultation bilaterally, normal work of breathing GI: soft, nontender, nondistended, + BS MS: no deformity or atrophy Skin: warm and dry, no rash Neuro:  Alert and Oriented x 3, Strength and sensation are intact Psych: euthymic mood, full affect   ASSESSMENT:    1. Shortness of breath   2. Precordial pain   3. Pre-procedure lab exam      PLAN:    In order of problems listed above:   Coronary artery disease involving native coronary artery of native heart with angina pectoris STEMI 2018-LAD stent.  Echo EF 55 to 60% Continue with aspirin 81 mg.  Has been intolerant to statins.  On Repatha.  He is diabetic as well.  With his symptoms of increased shortness of breath even with conversations, chest tightness, we will go ahead and proceed with cardiac PET to evaluate patency of his LAD stent and we will check an echocardiogram to ensure proper structure and function of his heart.  Diastolic heart failure I will start him on Farxiga 10 mg a day.  In the  past he has tried [place] for his diabetes and this did not agree with him he states.  We will try another SGLT2 inhibitor which could help with his shortness of breath if it is secondary to diastolic heart failure or stiffness of the heart.  Hypertension He has been off of the lisinopril.  Off of irbesartan 75 mg once a day, angiotensin receptor blocker.  He has a single kidney.  When he was 62 years old he had a kidney infection and now has a single kidney.  Large kidney he states.    Diabetes mellitus with coincident hypertension [facility]) He states that he is now taking his glyburide with his metformin.  His blood sugars have been in the 200 ranges.  A bit high.  In the past his hemoglobin A1c 9.3 range.  Marcelline Deist would help.  Anxiety disorder Asked previously about low-dose Xanax.  He was given another medication by his primary care physician.  He will continue to discuss with his primary team.  Pure hypercholesterolemia LDL goal less than 70, would not be unreasonable to be less than 55 given prior STEMI and diabetic.  Discussing with lipid clinic.  Intolerant to statins.  Most recent LDL 45.  Excellent.     Follow-up: 6 mths APP   Medication Adjustments/Labs and Tests Ordered: Current medicines are reviewed at length with the patient today.  Concerns regarding medicines are outlined above.   Orders Placed This Encounter  Procedures   NM PET CT CARDIAC PERFUSION MULTI W/ABSOLUTE BLOODFLOW   EKG 12-Lead   ECHOCARDIOGRAM COMPLETE   Meds ordered this encounter  Medications   dapagliflozin propanediol (FARXIGA) 10 MG TABS tablet    Sig: Take 1 tablet (10 mg total) by mouth daily before breakfast.    Dispense:  30 tablet    Refill:  11   Patient Instructions  Medication Instructions:  Please start Farxiga 10 mg once daily. Continue all other medications as listed.  *If you need a refill on your cardiac medications before your next appointment, please call your  pharmacy*   Testing/Procedures: Your physician has requested that you have an echocardiogram. Echocardiography is a painless test that uses sound waves to create images of  your heart. It provides your doctor with information about the size and shape of your heart and how well your heart's chambers and valves are working. This procedure takes approximately one hour. There are no restrictions for this procedure. Please do NOT wear cologne, perfume, aftershave, or lotions (deodorant is allowed). Please arrive 15 minutes prior to your appointment time.   How to Prepare for Your Cardiac PET/CT Stress Test:  1. Please do not take these medications before your test:   Medications that may interfere with the cardiac pharmacological stress agent (ex. nitrates - including erectile dysfunction medications, isosorbide mononitrate, tamulosin or beta-blockers) the day of the exam. (Erectile dysfunction medication should be held for at least 72 hrs prior to test) Theophylline containing medications for 12 hours. Dipyridamole 48 hours prior to the test. Your remaining medications may be taken with water.  2. Nothing to eat or drink, except water, 3 hours prior to arrival time.   NO caffeine/decaffeinated products, or chocolate 12 hours prior to arrival.  3. NO perfume, cologne or lotion  4. Total time is 1 to 2 hours; you may want to bring reading material for the waiting time.  5. Please report to Radiology at the [facility] Main Entrance 30 minutes early for your test.  [address] [place], [place] [address]  Diabetic Preparation:  Hold oral medications. You may take NPH and Lantus insulin. Do not take Humalog or Humulin R (Regular Insulin) the day of your test. Check blood sugars prior to leaving the house. If able to eat breakfast prior to 3 hour fasting, you may take all medications, including your insulin, Do not worry if you miss your breakfast dose of insulin - start at  your next meal.  IF YOU THINK YOU MAY BE PREGNANT, OR ARE NURSING PLEASE INFORM THE TECHNOLOGIST.  In preparation for your appointment, medication and supplies will be purchased.  Appointment availability is limited, so if you need to cancel or reschedule, please call the Radiology Department at [phone]  24 hours in advance to avoid a cancellation fee of $100.00  What to Expect After you Arrive:  Once you arrive and check in for your appointment, you will be taken to a preparation room within the Radiology Department.  A technologist or Nurse will obtain your medical history, verify that you are correctly prepped for the exam, and explain the procedure.  Afterwards,  an IV will be started in your arm and electrodes will be placed on your skin for EKG monitoring during the stress portion of the exam. Then you will be escorted to the PET/CT scanner.  There, staff will get you positioned on the scanner and obtain a blood pressure and EKG.  During the exam, you will continue to be connected to the EKG and blood pressure machines.  A small, safe amount of a radioactive tracer will be injected in your IV to obtain a series of pictures of your heart along with an injection of a stress agent.    After your Exam:  It is recommended that you eat a meal and drink a caffeinated beverage to counter act any effects of the stress agent.  Drink plenty of fluids for the remainder of the day and urinate frequently for the first couple of hours after the exam.  Your doctor will inform you of your test results within 7-10 business days.  For questions about your test or how to prepare for your test, please call: Rockwell Alexandria, Cardiac Imaging  Nurse Navigator  Larey Brick, Cardiac Imaging Nurse Navigator Office: [phone]   Follow-Up: At [facility], you and your health needs are our priority.  As part of our continuing mission to provide you with exceptional heart care, we have created  designated Provider Care Teams.  These Care Teams include your primary Cardiologist (physician) and Advanced Practice Providers (APPs -  Physician Assistants and Nurse Practitioners) who all work together to provide you with the care you need, when you need it.  We recommend signing up for the patient portal called "MyChart".  Sign up information is provided on this After Visit Summary.  MyChart is used to connect with patients for Virtual Visits (Telemedicine).  Patients are able to view lab/test results, encounter notes, upcoming appointments, etc.  Non-urgent messages can be sent to your provider as well.   To learn more about what you can do with MyChart, go to [email or web address].    Your next appointment:   6 month(s)  Provider:   Jari Favre, PA-C, Robin Searing, NP, Jacolyn Reedy, PA-C, Eligha Bridegroom, NP, or Tereso Newcomer, PA-C            Signed, Donato Schultz, MD  10/13/2022 9:56 AM    [facility]

## 2022-11-03 ENCOUNTER — Telehealth: Payer: Self-pay | Admitting: Cardiology

## 2022-11-03 ENCOUNTER — Encounter: Payer: Self-pay | Admitting: *Deleted

## 2022-11-03 DIAGNOSIS — R072 Precordial pain: Secondary | ICD-10-CM

## 2022-11-03 DIAGNOSIS — R0602 Shortness of breath: Secondary | ICD-10-CM

## 2022-11-03 NOTE — Telephone Encounter (Signed)
Unable to reach pt by phone.  Spoke with wife, Sherri (OK per [facility]) regarding need for testing to evaluate chest discomfort and DOE.  Advised Dr Anne Fu' ordered Lexiscan in place of previously ordered PET scan.  She states understanding and will explain to pt.  Instruction sent to pt via MyChart.  Sherri aware she will be called for Kaegen to be scheduled.  She was appreciative of the call and information.

## 2022-11-03 NOTE — Telephone Encounter (Signed)
  Pt's wife calling, she said, pt's doctor is looking for pt's DOT clearance letter from Dr. Anne Fu.

## 2022-11-03 NOTE — Telephone Encounter (Signed)
Spoke to the patient, he had an OV with [facility] Physician for a physical. Pt stated the MD wanted some type of documentation that the patient is cleared . I made several attempts to clarify if pt had any documents for our office to complete, pt stated he was advised for the cardiologist to call the PCP office. Made several attempts to explain our office protocols, pt stated " you mean the doctor can't pick up the phone and call the other doctor". Once again, explained our office policy. I did call [facility] Physician and left a voicemail to call our office.

## 2022-11-07 ENCOUNTER — Encounter (HOSPITAL_COMMUNITY): Payer: Self-pay

## 2022-11-07 ENCOUNTER — Telehealth (HOSPITAL_COMMUNITY): Payer: Self-pay | Admitting: *Deleted

## 2022-11-07 NOTE — Telephone Encounter (Signed)
Pt has been scheduled for Lexiscan on 11/09/2022  as ordered.

## 2022-11-07 NOTE — Telephone Encounter (Signed)
Spoke with pt and went over instructions for MPI study. Also sent my chart message.

## 2022-11-09 ENCOUNTER — Ambulatory Visit (HOSPITAL_COMMUNITY): Payer: Federal, State, Local not specified - PPO | Attending: Internal Medicine

## 2022-11-09 ENCOUNTER — Ambulatory Visit (HOSPITAL_BASED_OUTPATIENT_CLINIC_OR_DEPARTMENT_OTHER): Payer: Federal, State, Local not specified - PPO

## 2022-11-09 DIAGNOSIS — R072 Precordial pain: Secondary | ICD-10-CM

## 2022-11-09 DIAGNOSIS — R0602 Shortness of breath: Secondary | ICD-10-CM | POA: Insufficient documentation

## 2022-11-09 LAB — MYOCARDIAL PERFUSION IMAGING
Base ST Depression (mm): 0 mm
LV dias vol: 113 mL (ref 62–150)
LV sys vol: 51 mL
Nuc Stress EF: 55 %
Peak HR: 91 {beats}/min
Rest HR: 62 {beats}/min
Rest Nuclear Isotope Dose: 10.5 mCi
SDS: 0
SRS: 0
SSS: 0
ST Depression (mm): 0 mm
Stress Nuclear Isotope Dose: 31.2 mCi
TID: 1.09

## 2022-11-09 LAB — ECHOCARDIOGRAM COMPLETE
Area-P 1/2: 3.58 cm2
S' Lateral: 3 cm

## 2022-11-09 MED ORDER — PERFLUTREN LIPID MICROSPHERE
1.0000 mL | INTRAVENOUS | Status: AC | PRN
  Administered 2022-11-09: 2 mL via INTRAVENOUS

## 2022-11-09 MED ORDER — TECHNETIUM TC 99M TETROFOSMIN IV KIT
31.2000 | PACK | Freq: Once | INTRAVENOUS | Status: AC | PRN
  Administered 2022-11-09: 31.2 via INTRAVENOUS

## 2022-11-09 MED ORDER — REGADENOSON 0.4 MG/5ML IV SOLN
0.4000 mg | Freq: Once | INTRAVENOUS | Status: AC
Start: 2022-11-09 — End: 2022-11-09
  Administered 2022-11-09: 0.4 mg via INTRAVENOUS

## 2022-11-09 MED ORDER — TECHNETIUM TC 99M TETROFOSMIN IV KIT
10.5000 | PACK | Freq: Once | INTRAVENOUS | Status: AC | PRN
  Administered 2022-11-09: 10.5 via INTRAVENOUS

## 2023-01-17 ENCOUNTER — Other Ambulatory Visit: Payer: Self-pay | Admitting: Cardiology

## 2023-01-17 DIAGNOSIS — I2510 Atherosclerotic heart disease of native coronary artery without angina pectoris: Secondary | ICD-10-CM

## 2023-01-17 DIAGNOSIS — E7800 Pure hypercholesterolemia, unspecified: Secondary | ICD-10-CM

## 2023-01-18 ENCOUNTER — Other Ambulatory Visit (HOSPITAL_COMMUNITY): Payer: Self-pay

## 2023-01-18 ENCOUNTER — Telehealth: Payer: Self-pay

## 2023-01-18 NOTE — Telephone Encounter (Signed)
Pharmacy Patient Advocate Encounter   Received notification from CoverMyMeds that prior authorization for REPATHA is required/requested.   Insurance verification completed.   The patient is insured through [employer] [facility] .   Per test claim: PA required; PA submitted to [employer] [facility] via CoverMyMeds Key/confirmation #/EOC [id number]     Status is pending

## 2023-01-19 ENCOUNTER — Other Ambulatory Visit (HOSPITAL_COMMUNITY): Payer: Self-pay

## 2023-01-19 NOTE — Telephone Encounter (Signed)
Pharmacy Patient Advocate Encounter  Received notification from [employer] [facility] that Prior Authorization for Repatha SureClick 140MG /ML auto-injectors has been APPROVED from 01/18/23 to 01/17/24   PA #/Case ID/Reference #: [id number]

## 2023-01-30 ENCOUNTER — Telehealth: Payer: Self-pay | Admitting: *Deleted

## 2023-01-30 NOTE — Telephone Encounter (Signed)
   Pre-operative Risk Assessment    Patient Name: Connor Reed  DOB: 06-Dec-1960 MRN: [id number]    DATE OF LAST VISIT: 10/13/22 DR. SKAINS DATE OF NEXT VISIT: NONE   Request for Surgical Clearance    Procedure:   LEFT TOTAL KNEE   Date of Surgery:  Clearance 03/07/23                                 Surgeon:  DR. Thamas Jaegers Surgeon's Group or Practice Name:  [facility] Phone number:  [phone] ATT: Leonor Liv, CMA Fax number:  [phone]   Type of Clearance Requested:   - Medical ; ASA    Type of Anesthesia:  Spinal   Additional requests/questions:    Elpidio Anis   01/30/2023, 4:15 PM

## 2023-01-31 NOTE — Telephone Encounter (Signed)
   Name: [name]  DOB: [birth year]  MRN: [id number]  Primary Cardiologist: Donato Schultz, MD   Preoperative team, please contact this patient and set up a phone call appointment for further preoperative risk assessment. Please obtain consent and complete medication review. Thank you for your help.  I confirm that guidance regarding antiplatelet and oral anticoagulation therapy has been completed and, if necessary, noted below.  Ideally aspirin should be continued without interruption, however if the bleeding risk is too great, aspirin may be held for 5-7 days prior to surgery. Please resume aspirin post operatively when it is felt to be safe from a bleeding standpoint.     Carlos Levering, NP 01/31/2023, 1:12 PM [facility]

## 2023-01-31 NOTE — Telephone Encounter (Signed)
 Tried to call the pt to set up tele pre op appt, but no answer.

## 2023-02-02 ENCOUNTER — Telehealth: Payer: Self-pay | Admitting: *Deleted

## 2023-02-02 NOTE — Telephone Encounter (Signed)
S/w pt Surgery is scheduled for October 3.  Telephone clearance set up. Will remove from pre op pool.   Patient Consent for Virtual Visit         Connor Reed has provided verbal consent on 02/02/2023 for a virtual visit (video or telephone).   CONSENT FOR VIRTUAL VISIT FOR:  Connor Reed  By participating in this virtual visit I agree to the following:  I hereby voluntarily request, consent and authorize [facility] and its employed or contracted physicians, physician assistants, nurse practitioners or other licensed health care professionals (the Practitioner), to provide me with telemedicine health care services (the "Services") as deemed necessary by the treating Practitioner. I acknowledge and consent to receive the Services by the Practitioner via telemedicine. I understand that the telemedicine visit will involve communicating with the Practitioner through live audiovisual communication technology and the disclosure of certain medical information by electronic transmission. I acknowledge that I have been given the opportunity to request an in-person assessment or other available alternative prior to the telemedicine visit and am voluntarily participating in the telemedicine visit.  I understand that I have the right to withhold or withdraw my consent to the use of telemedicine in the course of my care at any time, without affecting my right to future care or treatment, and that the Practitioner or I may terminate the telemedicine visit at any time. I understand that I have the right to inspect all information obtained and/or recorded in the course of the telemedicine visit and may receive copies of available information for a reasonable fee.  I understand that some of the potential risks of receiving the Services via telemedicine include:  Delay or interruption in medical evaluation due to technological equipment failure or disruption; Information transmitted may not be sufficient  (e.g. poor resolution of images) to allow for appropriate medical decision making by the Practitioner; and/or  In rare instances, security protocols could fail, causing a breach of personal health information.  Furthermore, I acknowledge that it is my responsibility to provide information about my medical history, conditions and care that is complete and accurate to the best of my ability. I acknowledge that Practitioner's advice, recommendations, and/or decision may be based on factors not within their control, such as incomplete or inaccurate data provided by me or distortions of diagnostic images or specimens that may result from electronic transmissions. I understand that the practice of medicine is not an exact science and that Practitioner makes no warranties or guarantees regarding treatment outcomes. I acknowledge that a copy of this consent can be made available to me via my patient portal [facility] MyChart), or I can request a printed copy by calling the office of  HeartCare.    I understand that my insurance will be billed for this visit.   I have read or had this consent read to me. I understand the contents of this consent, which adequately explains the benefits and risks of the Services being provided via telemedicine.  I have been provided ample opportunity to ask questions regarding this consent and the Services and have had my questions answered to my satisfaction. I give my informed consent for the services to be provided through the use of telemedicine in my medical care

## 2023-02-21 ENCOUNTER — Ambulatory Visit: Payer: Federal, State, Local not specified - PPO | Attending: Physician Assistant

## 2023-02-21 DIAGNOSIS — Z01810 Encounter for preprocedural cardiovascular examination: Secondary | ICD-10-CM

## 2023-02-21 NOTE — Progress Notes (Signed)
Virtual Visit via Telephone Note   Because of Connor Reed's co-morbid illnesses, he is at least at moderate risk for complications without adequate follow up.  This format is felt to be most appropriate for this patient at this time.  The patient did not have access to video technology/had technical difficulties with video requiring transitioning to audio format only (telephone).  All issues noted in this document were discussed and addressed.  No physical exam could be performed with this format.  Please refer to the patient's chart for his consent to telehealth for [name].  Evaluation Performed:  Preoperative cardiovascular risk assessment _____________   Date:  02/21/2023   Patient ID:  Connor Reed, DOB 02-25-1961, MRN [id number] Patient Location:  Home Provider location:   Office  Primary Care Provider:  Nonnie Done., MD Primary Cardiologist:  Donato Schultz, MD  Chief Complaint / Patient Profile   62 y.o. y/o male with a h/o CAD, STEMI in 2018 s/p stenting to the LAD, HTN, diabetes mellitus who is pending left total knee arthroplasty and presents today for telephonic preoperative cardiovascular risk assessment.  History of Present Illness    Connor Reed is a 62 y.o. male who presents via audio/video conferencing for a telehealth visit today.  Pt was last seen in cardiology clinic on 10/13/22 by Dr. Anne Fu. At that time Connor Reed was doing well, he had a myoview stress test and echocardiogram in June, 2024 which were reassuring.  The patient is now pending procedure as outlined above. Since his last visit, he has remained stable from a cardiac standpoint. He is able to meet greater than 4 METs of activity.   Today he denies chest pain, shortness of breath, lower extremity edema, fatigue, palpitations, melena, hematuria, hemoptysis, diaphoresis, weakness, presyncope, syncope, orthopnea, and PND.   Past Medical History    Past Medical History:  Diagnosis  Date   Asthma    Chronic bronchitis (HCC)    Hypertension    Past Surgical History:  Procedure Laterality Date   LEFT HEART CATH AND CORONARY ANGIOGRAPHY N/A 12/18/2016   Procedure: Left Heart Cath and Coronary Angiography;  Surgeon: Lyn Records, MD;  Location: [facility] INVASIVE CV LAB;  Service: Cardiovascular;  Laterality: N/A;   Allergies Allergies  Allergen Reactions   Acetaminophen Itching and Rash   Penicillins Rash, Shortness Of Breath and Swelling   Home Medications    Prior to Admission medications   Medication Sig Start Date End Date Taking? Authorizing Provider  albuterol (VENTOLIN HFA) 108 (90 Base) MCG/ACT inhaler Inhale 2 puffs into the lungs every 4 (four) hours as needed for wheezing or shortness of breath. 07/11/19   Claudean Severance, MD  aspirin 81 MG chewable tablet CHEW AND SWALLOW 1 TABLET BY MOUTH ONCE DAILY 12/26/18   Eliezer Bottom, MD  clonazePAM (KLONOPIN) 0.5 MG tablet Take 0.25-0.5 mg by mouth at bedtime. 03/26/20   [provider]  dapagliflozin propanediol (FARXIGA) 10 MG TABS tablet Take 1 tablet (10 mg total) by mouth daily before breakfast. 10/13/22   Jake Bathe, MD  Evolocumab (REPATHA SURECLICK) 140 MG/ML SOAJ INJECT THE CONTENTS OF 1 PEN INTO THE SKIN EVERY 14 DAYS 01/18/23   Jake Bathe, MD  glucose blood (ONETOUCH VERIO) test strip Test blood sugar two times a day. Diagnosis code E11.00, non-insulin dependent diabetes 12/26/18   Eliezer Bottom, MD  glyBURIDE (DIABETA) 1.25 MG tablet Take 1.25 mg by mouth daily with breakfast.  [provider]  nitroGLYCERIN (NITROSTAT) 0.4 MG SL tablet Place 1 tablet (0.4 mg total) under the tongue every 5 (five) minutes as needed for chest pain. 08/19/18 02/02/23  Claudean Severance, MD  [facility] DELICA LANCETS FINE MISC Use to check blood sugar two times daily. Diagnosis code:E11.00 03/04/18   Synetta Shadow, MD    Physical Exam    Vital Signs:  Connor Reed does not have vital signs available for  review today.  Given telephonic nature of communication, physical exam is limited. AAOx3. NAD. Normal affect.  Speech and respirations are unlabored.  Accessory Clinical Findings    None  Assessment & Plan    1.  Preoperative Cardiovascular Risk Assessment: Left total knee arthroplasty   [name] perioperative risk of a major cardiac event is 0.9% according to the Revised Cardiac Risk Index (RCRI).  Therefore, he is at low risk for perioperative complications.   His functional capacity is good at 7.01 METs according to the Duke Activity Status Index (DASI). Recommendations: According to ACC/AHA guidelines, no further cardiovascular testing needed.  The patient may proceed to surgery at acceptable risk.   Antiplatelet and/or Anticoagulation Recommendations: Aspirin can be held for 5-7 days prior to surgery. Please resume aspirin post operatively when it is felt to be safe from a bleeding standpoint.    The patient was advised that if he develops new symptoms prior to surgery to contact our office to arrange for a follow-up visit, and he verbalized understanding.  A copy of this note will be routed to requesting surgeon.  Time:   Today, I have spent 6 minutes with the patient with telehealth technology discussing medical history, symptoms, and management plan.    Rip Harbour, NP  02/21/2023, 9:30 AM

## 2023-08-24 ENCOUNTER — Other Ambulatory Visit: Payer: Self-pay | Admitting: Cardiology

## 2023-08-24 DIAGNOSIS — I2510 Atherosclerotic heart disease of native coronary artery without angina pectoris: Secondary | ICD-10-CM

## 2023-08-24 DIAGNOSIS — E7800 Pure hypercholesterolemia, unspecified: Secondary | ICD-10-CM

## 2023-11-02 ENCOUNTER — Telehealth: Payer: Self-pay | Admitting: Cardiology

## 2023-11-02 ENCOUNTER — Other Ambulatory Visit: Payer: Self-pay | Admitting: Urology

## 2023-11-02 NOTE — Telephone Encounter (Signed)
   Name: Connor Reed  DOB: 21-Feb-1961  MRN: [id number]   Primary Cardiologist: Dorothye Gathers, MD     Preoperative team, please contact this patient and set up a phone call appointment for further preoperative risk assessment. Please obtain consent and complete medication review. Thank you for your help.   I confirm that guidance regarding antiplatelet and oral anticoagulation therapy has been completed and, if necessary, noted below.   Per office protocol, if patient is without any new symptoms or concerns at the time of their virtual visit, he may hold Aspirin  for  7 days prior to procedure. Please resume Aspirin  as soon as possible postprocedure, at the discretion of the surgeon.     I also confirmed the patient resides in the state of [place] . As per [facility] Medical Board telemedicine laws, the patient must reside in the state in which the provider is licensed.     Friddie Jetty, NP 11/02/2023, 9:21 AM [facility]

## 2023-11-02 NOTE — Telephone Encounter (Signed)
 Dr Luciano Ruths from [facility] in [place] requesting pt's stress test and echo be faxed to [phone] asap if possible. 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

## 2023-11-02 NOTE — Telephone Encounter (Signed)
    Name: Connor Reed  DOB: 21-Feb-1961  MRN: [id number]   Primary Cardiologist: Dorothye Gathers, MD     Preoperative team, please contact this patient and set up a phone call appointment for further preoperative risk assessment. Please obtain consent and complete medication review. Thank you for your help.   I confirm that guidance regarding antiplatelet and oral anticoagulation therapy has been completed and, if necessary, noted below.   Per office protocol, if patient is without any new symptoms or concerns at the time of their virtual visit, he may hold Aspirin  for  7 days prior to procedure. Please resume Aspirin  as soon as possible postprocedure, at the discretion of the surgeon.     I also confirmed the patient resides in the state of [place] . As per [facility] Medical Board telemedicine laws, the patient must reside in the state in which the provider is licensed.     Friddie Jetty, NP 11/02/2023, 9:21 AM [facility]

## 2023-11-02 NOTE — Telephone Encounter (Signed)
   Pre-operative Risk Assessment    Patient Name: Connor Reed  DOB: 1960-11-27 MRN: [id number]   Date of last office visit: 10/13/22 Date of next office visit: nothing scheduled    Request for Surgical Clearance    Procedure:  Ureteroscopy stone removal  Date of Surgery:  Clearance 11/08/23                                Surgeon:  Dr. Marshell Skelton Group or Practice Name:  [facility]   Phone number:  [phone]  Fax number:  [phone]    Type of Clearance Requested:   - Medical    Type of Anesthesia:  General    Additional requests/questions:    [name]   11/02/2023, 8:53 AM

## 2023-11-02 NOTE — Telephone Encounter (Signed)
 Left message for the pt to call back ASAP to schedule tele preop appt due to procedure date 6/5/ as well ok to hold ASA x 7 days.

## 2023-11-05 ENCOUNTER — Other Ambulatory Visit: Payer: Self-pay

## 2023-11-05 ENCOUNTER — Encounter (HOSPITAL_COMMUNITY): Payer: Self-pay | Admitting: Urology

## 2023-11-05 ENCOUNTER — Ambulatory Visit (INDEPENDENT_AMBULATORY_CARE_PROVIDER_SITE_OTHER)

## 2023-11-05 ENCOUNTER — Other Ambulatory Visit: Payer: Self-pay | Admitting: Urology

## 2023-11-05 ENCOUNTER — Telehealth: Payer: Self-pay | Admitting: *Deleted

## 2023-11-05 NOTE — Telephone Encounter (Signed)
 Pt was added on today ok per preop APP Katlyn West, NP due to procedure date and med hold for ASA. Pt tells me that he last took ASA on 11/02/23 Friday. Med rec and consent are done.

## 2023-11-05 NOTE — Telephone Encounter (Signed)
   Name: Connor Reed  DOB: 22-Mar-1961  MRN: [id number]  Primary Cardiologist: Dorothye Gathers, MD  Chart reviewed as part of pre-operative protocol coverage. Because of [name] past medical history and time since last visit, he will require a follow-up in-office visit in order to better assess preoperative cardiovascular risk. Patient last seen in office on 10/13/22.   Pre-op covering staff: - Please schedule appointment and call patient to inform them. If patient already had an upcoming appointment within acceptable timeframe, please add "pre-op clearance" to the appointment notes so provider is aware. - Please contact requesting surgeon's office via preferred method (i.e, phone, fax) to inform them of need for appointment prior to surgery.  [name], NP  11/05/2023, 10:31 AM

## 2023-11-05 NOTE — Telephone Encounter (Signed)
 I s/w the Connor Reed and explained that he was last seen in the office 5//2024, per preop APP due to last in office appt he needs to be seen in the office for preop clearance. I reached out to our DOD for tomorrow as I did not have any appts today to offer the Connor Reed. Our DOD Dr. Alroy Aspen agreed to see the Connor Reed tomorrow at the 2:20 time slot, ok per nurse Clance Crigler RN.   I did apologize to the Connor Reed for all of the trouble.    I will update the surgeon office as well.

## 2023-11-05 NOTE — Telephone Encounter (Signed)
 Pt was added on today ok per preop APP Katlyn West, NP due to procedure date and med hold for ASA. Pt tells me that he last took ASA on 11/02/23 Friday. Med rec and consent are done.     Patient Consent for Virtual Visit        Connor Reed has provided verbal consent on 11/05/2023 for a virtual visit (video or telephone).   CONSENT FOR VIRTUAL VISIT FOR:  Connor Reed  By participating in this virtual visit I agree to the following:  I hereby voluntarily request, consent and authorize [facility] and its employed or contracted physicians, physician assistants, nurse practitioners or other licensed health care professionals (the Practitioner), to provide me with telemedicine health care services (the "Services") as deemed necessary by the treating Practitioner. I acknowledge and consent to receive the Services by the Practitioner via telemedicine. I understand that the telemedicine visit will involve communicating with the Practitioner through live audiovisual communication technology and the disclosure of certain medical information by electronic transmission. I acknowledge that I have been given the opportunity to request an in-person assessment or other available alternative prior to the telemedicine visit and am voluntarily participating in the telemedicine visit.  I understand that I have the right to withhold or withdraw my consent to the use of telemedicine in the course of my care at any time, without affecting my right to future care or treatment, and that the Practitioner or I may terminate the telemedicine visit at any time. I understand that I have the right to inspect all information obtained and/or recorded in the course of the telemedicine visit and may receive copies of available information for a reasonable fee.  I understand that some of the potential risks of receiving the Services via telemedicine include:  Delay or interruption in medical evaluation due to technological  equipment failure or disruption; Information transmitted may not be sufficient (e.g. poor resolution of images) to allow for appropriate medical decision making by the Practitioner; and/or  In rare instances, security protocols could fail, causing a breach of personal health information.  Furthermore, I acknowledge that it is my responsibility to provide information about my medical history, conditions and care that is complete and accurate to the best of my ability. I acknowledge that Practitioner's advice, recommendations, and/or decision may be based on factors not within their control, such as incomplete or inaccurate data provided by me or distortions of diagnostic images or specimens that may result from electronic transmissions. I understand that the practice of medicine is not an exact science and that Practitioner makes no warranties or guarantees regarding treatment outcomes. I acknowledge that a copy of this consent can be made available to me via my patient portal [facility] MyChart), or I can request a printed copy by calling the office of  [facility].    I understand that my insurance will be billed for this visit.   I have read or had this consent read to me. I understand the contents of this consent, which adequately explains the benefits and risks of the Services being provided via telemedicine.  I have been provided ample opportunity to ask questions regarding this consent and the Services and have had my questions answered to my satisfaction. I give my informed consent for the services to be provided through the use of telemedicine in my medical care

## 2023-11-05 NOTE — Progress Notes (Addendum)
 For Anesthesia: PCP - Lucius Sabins, MD  Cardiologist - Dorothye Gathers, MD cardiac clearance note in epic by Lorrin Rotter, NP  11/06/23  Bowel Prep reminder: N/A  Chest x-ray - greater than 1 year EKG - 05/10/23 CEW requested received in media tab 11/06/23 Stress Test - 11/09/2022 in [facility] ECHO - 11/09/2022 in [facility] Cardiac Cath - 12/18/2016 in [facility] Pacemaker/ICD device last checked: N/A Pacemaker orders received: N/A Device Rep notified: N/A  Spinal Cord Stimulator: N/A  Sleep Study - N/A CPAP - N/A  Fasting Blood Sugar - 120-160 Checks Blood Sugar __1___ times a day Date and result of last Hgb A1c- unknown  Last dose of GLP1 agonist- N/A GLP1 instructions: N/A  Last dose of SGLT-2 inhibitors- N/A SGLT-2 instructions: last dose N/A  Blood Thinner Instructions: N/A Aspirin  Instructions: 81 mg Last Dose: 11/02/23  Activity level: Able to exercise without chest pain and/or shortness of breath  Anesthesia review: h/o CAD, STEMI in 2018 s/p stenting to the LAD, HTN, diabetes mellitus   Patient denies shortness of breath, fever, cough and chest pain at PAT appointment   Patient verbalized understanding of instructions that were given to them at the PAT appointment. Patient was also instructed that they will need to review over the PAT instructions again at home before surgery.

## 2023-11-05 NOTE — Progress Notes (Signed)
 Patient will require in office appointment for preoperative cardiac evaluation. Scheduled for office visit on 11/06/23 with Dr. Alroy Aspen.

## 2023-11-05 NOTE — Progress Notes (Signed)
  Cardiology Office Note   Date:  11/06/2023  ID:  [name], DOB May 15, 1961, MRN [id number] PCP: Lucius Sabins., MD  [facility] Providers Cardiologist:  Dorothye Gathers, MD     History of Present Illness Connor Reed is a 63 y.o. male with hx of CAD , STEMI with LAD stent in 2018, HTN  Has a single kidney, statin intolerant , HTN  He presents for pre op evaluation for ureteroscopic kidney stone removal in 2 days  He stopped his ASA last Thursday  No CP since his STEMI 7 years ago   Exercising / working out  [employer] 3.5 miles this past Sunday   Had a knee replacement several years ago  No dyspnea   Works in demolition      ROS:   Studies Reviewed EKG Interpretation Date/Time:  Tuesday November 06 2023 14:25:59 EDT Ventricular Rate:  89 PR Interval:  194 QRS Duration:  92 QT Interval:  342 QTC Calculation: 416 R Axis:   8  Text Interpretation: Normal sinus rhythm Possible Anterior infarct , age undetermined When compared with ECG of 19-Dec-2016 06:48, T wave inversion no longer evident in Anterolateral leads Ant. MI changes have resolved. Confirmed by Ahmad Alert [phone]) on 11/06/2023 2:39:07 PM     Risk Assessment/Calculations           Physical Exam VS:  BP 125/78   Pulse 89   Ht 6' (1.829 m)   Wt 237 lb 6.4 oz (107.7 kg)   SpO2 96%   BMI 32.20 kg/m    Wt Readings from Last 3 Encounters:  11/06/23 237 lb 6.4 oz (107.7 kg)  11/09/22 242 lb (109.8 kg)  10/13/22 242 lb (109.8 kg)    GEN: Well nourished, well developed in no acute distress NECK: No JVD; No carotid bruits CARDIAC: RRR, no murmurs, rubs, gallops RESPIRATORY:  Clear to auscultation without rales, wheezing or rhonchi  ABDOMEN: Soft, non-tender, non-distended EXTREMITIES:  No edema; No deformity   ASSESSMENT AND PLAN   Pre op evaluation prior to renal stone surgery  He is stable from a cardiac standpoint and is at low risk for this surgery  He may hold his ASA for 5-7 days ( is  already holding )  Continue to hold until the stent is out several days later.  Follow up with Dr. Renna Cary in several months       Dispo:   Signed, Ahmad Alert, MD

## 2023-11-06 ENCOUNTER — Encounter: Payer: Self-pay | Admitting: Cardiovascular Disease

## 2023-11-06 ENCOUNTER — Ambulatory Visit: Attending: Cardiovascular Disease | Admitting: Cardiovascular Disease

## 2023-11-06 VITALS — BP 125/78 | HR 89 | Ht 72.0 in | Wt 237.4 lb

## 2023-11-06 DIAGNOSIS — Z01810 Encounter for preprocedural cardiovascular examination: Secondary | ICD-10-CM

## 2023-11-06 DIAGNOSIS — I2510 Atherosclerotic heart disease of native coronary artery without angina pectoris: Secondary | ICD-10-CM

## 2023-11-06 NOTE — Patient Instructions (Signed)
 Testing/Procedures: **Cleared for upcoming procedure**May hold aspirin  for 7 days in preparation for procedure and restart as soon as possible per surgeon**  Follow-Up: At [facility], you and your health needs are our priority.  As part of our continuing mission to provide you with exceptional heart care, our providers are all part of one team.  This team includes your primary Cardiologist (physician) and Advanced Practice Providers or APPs (Physician Assistants and Nurse Practitioners) who all work together to provide you with the care you need, when you need it.  Your next appointment:   6 month(s)  Provider:   Dorothye Gathers, MD

## 2023-11-07 ENCOUNTER — Encounter (HOSPITAL_COMMUNITY): Payer: Self-pay | Admitting: Urology

## 2023-11-07 NOTE — H&P (Signed)
 63 year old male seen for BPH LUTS including frequency and weak stream. Started on tamsulosin in 2023. PVR at that time was 322. Patient also is a history of solitary right kidney due to nephrectomy at 63 years old for infection. Patient also has erectile dysfunction currently managed with tadalafil.   PMH: DM2, Glyburide and metformin  , only blood thinner.   BPH LUTS:  08/22/2023: PVR 240, patient is straining, nocturia 2-3 times a night, no pt urinates 2-3 times a day. Pt has not been evalurated for OSA. Patient no ttaking tamsulosin  09/10/23: PVR 0 today, CT shows moderate sized prostate  5/1 cysto for BPH eval, PVR 6, some straining with urination not interested in medications.   Solitary right kidney:  08/22/2023: get chem 7  09/10/23: having some R flank pain that has been present since before last visit. Cr from last visit 0.9, CT shows no hydro, L ureter some distal dilation   ED:  08/22/2023: tadalifil daiy, not working as well will start PRN meds   Glucosuria:  08/22/2023: 2+ glucosuria, on oral medication, per pt A1c 6.5   Hematuria:  09/10/23: UA shows no blood, had GH yesterday, quit in 1992, 20 pack years. Having some flank pain as well. Offered cysto patient would like to do at next visit. CT shows no hydro, some small non obstructing stones.  10/04/23: GH eval with cysto, CT shows no filling defects, CT non con reveiwed again no masses or irregulariteis cystoscopy today shows no tumors, small blood vessel just of the right ureteral orifice no concerns for malignancy. There is a weird banding within the bladder probably from his previous surgery to remove his kidney. No tumors or other abnormalities. , Recent knee replacement will give Bactrim.   Nephrolithiasis:  11/01/23: Patient was found to have a 3 mm nonobstructing right renal stone on CT in April. He complains of right flank pain that has been persistent since March. He notes the pain has gotten worse recently. He also had an episode  of gross hematuria yesterday as well as increased frequency and urgency. Denies fever or chills, nausea or vomiting.  11/07/23: pt is here today for ureteroscopy.     ALLERGIES: Penicillins Tylenol TABS    MEDICATIONS: Albuterol  Sulfate HFA 108 (90 Base) MCG/ACT Aerosol Solution 1 PO Daily  Aspirin  EC Low Dose 81 MG Tablet Delayed Release  glyBURIDE 2.5 MG Tablet 1 tablet PO Daily  GoodSense Ibuprofen 200 MG Tablet  Lisinopril  10 MG Tablet 1 tablet PO Daily  metFORMIN  HCl 1000 MG Tablet 1 tablet PO Daily  Repatha  SureClick 140 MG/ML Solution Auto-injector 1 PO Daily  Tadalafil (PAH) 20 MG Tablet 1 tablet PO As Directed take 1 hour before sexual activity on an empty stomach     GU PSH: Cystoscopy - 10/05/2023 Simple Nephrectomy - 2008       PSH Notes: Back Surgery   NON-GU PSH: Amputate Finger/thumb Visit Complexity (formerly GPC1X) - 10/05/2023, 09/10/2023     GU PMH: BPH w/LUTS - 10/05/2023, - 09/10/2023, - 08/22/2023, - 09/04/2022, - 05/19/2022 Gross hematuria - 10/05/2023, - 09/10/2023 Straining on Urination - 10/05/2023 Nocturia - 09/10/2023, - 08/22/2023 ED due to arterial insufficiency - 08/22/2023, - 05/19/2022 Incomplete bladder emptying - 08/22/2023, - 09/04/2022, - 05/19/2022 Renal calculus, Kidney stone on right side - 2014      PMH Notes:  1898-06-05 00:00:00 - Note: Normal Routine History And Physical Adult   NON-GU PMH: Acquired absence of kidney - 10/05/2023, - 09/10/2023, - 08/22/2023 Anxiety,  Anxiety - 2014 Asthma, Asthma - 2014 Personal history of other specified conditions, History of heartburn - 2014 Renal agenesis, unilateral, Congenital absence of one kidney - 2014 Arthritis Diabetes Type 2 Hypercholesterolemia Hypertension Myocardial Infarction    FAMILY HISTORY: Diabetes - Brother Family Health Status Number - Runs In Family Lung Cancer - Father   SOCIAL HISTORY: Marital Status: Married Ethnicity: Not Hispanic Or Latino; Race: White Current Smoking Status: Patient  does not smoke anymore. Has not smoked since 05/06/1991.   Tobacco Use Assessment Completed: Used Tobacco in last 30 days? Does drink.  Drinks 2 caffeinated drinks per day.     Notes: Occupation:, Death In The Family Father, Alcohol Use, Marital History - Divorced, Tobacco Use, Caffeine Use   REVIEW OF SYSTEMS:    GU Review Male:   Patient reports frequent urination, hard to postpone urination, and get up at night to urinate. Patient denies burning/ pain with urination, leakage of urine, stream starts and stops, trouble starting your stream, have to strain to urinate , erection problems, and penile pain.  Gastrointestinal (Upper):   Patient denies nausea, vomiting, and indigestion/ heartburn.  Gastrointestinal (Lower):   Patient denies diarrhea and constipation.  Constitutional:   Patient denies fever, night sweats, weight loss, and fatigue.  Skin:   Patient denies skin rash/ lesion and itching.  Eyes:   Patient denies blurred vision and double vision.  Ears/ Nose/ Throat:   Patient denies sore throat and sinus problems.  Hematologic/Lymphatic:   Patient denies swollen glands and easy bruising.  Cardiovascular:   Patient denies leg swelling and chest pains.  Respiratory:   Patient denies cough and shortness of breath.  Endocrine:   Patient denies excessive thirst.  Musculoskeletal:   Patient reports back pain. Patient denies joint pain.  Neurological:   Patient denies headaches and dizziness.  Psychologic:   Patient denies depression and anxiety.   VITAL SIGNS:      11/01/2023 02:12 PM  BP 169/94 mmHg  Pulse 77 /min  Temperature 97.7 F / 36.5 C   MULTI-SYSTEM PHYSICAL EXAMINATION:    Constitutional: Well-nourished. No physical deformities. Normally developed. Good grooming.  Neck: Neck symmetrical, not swollen. Normal tracheal position.  Respiratory: No labored breathing, no use of accessory muscles.   Cardiovascular: Normal temperature, normal extremity pulses, no swelling, no  varicosities.  Neurologic / Psychiatric: Oriented to time, oriented to place, oriented to person. No depression, no anxiety, no agitation.  Gastrointestinal: No mass, no tenderness, no rigidity, non obese abdomen.  Musculoskeletal: Normal gait and station of head and neck.     Complexity of Data:  Source Of History:  Patient, Family/Caregiver, Medical Record Summary  Records Review:   Previous Doctor Records, Previous Patient Records  Urine Test Review:   Urinalysis  X-Ray Review: KUB: Reviewed Films. Reviewed Report. Discussed With Patient.  C.T. Abdomen/Pelvis: Reviewed Films. Reviewed Report. Discussed With Patient.     10/26/06  PSA  Total PSA 0.49     11/01/23  Urinalysis  Urine Appearance Clear   Urine Color Yellow   Urine Glucose Neg mg/dL  Urine Bilirubin Neg mg/dL  Urine Ketones Neg mg/dL  Urine Specific Gravity 1.020   Urine Blood Neg ery/uL  Urine pH 6.0   Urine Protein Neg mg/dL  Urine Urobilinogen 0.2 mg/dL  Urine Nitrites Neg   Urine Leukocyte Esterase Neg leu/uL   PROCEDURES:         KUB - 74018  A single view of the abdomen is obtained. Right renal  shadow visualized, 2 opacities noted within. Ureteral tract grossly clear. No additional calcifications noted.       Patient confirmed No Neulasta OnPro Device.          PVR Ultrasound - 40981  Scanned Volume: 9 cc         Visit Complexity - G2211          Urinalysis Dipstick Dipstick Cont'd  Color: Yellow Bilirubin: Neg mg/dL  Appearance: Clear Ketones: Neg mg/dL  Specific Gravity: 1.914 Blood: Neg ery/uL  pH: 6.0 Protein: Neg mg/dL  Glucose: Neg mg/dL Urobilinogen: 0.2 mg/dL    Nitrites: Neg    Leukocyte Esterase: Neg leu/uL    ASSESSMENT:      ICD-10 Details  1 GU:   Gross hematuria - R31.0 Acute, Uncomplicated  2   Renal calculus - N20.0 Chronic, Stable  3   Flank Pain - R10.84 Undiagnosed New Problem   PLAN:           Orders X-Rays: KUB          Schedule Return Visit/Planned  Activity: Next Available Appointment - Schedule Surgery          Document Letter(s):  Created for Patient: Clinical Summary         Notes:   UA today negative. KUB without obvious ureteral stone, but continued right renal stones. We discussed the management of renal/ureteral stones. These options include observation, ureteroscopy, and shockwave lithotripsy. We discussed which options are relevant to these particular stones. We also discussed the efficacy of each treatment in its ability to clear the stone burden. With any of these management options I discussed the signs and symptoms of infection and the need for emergent treatment should these be experienced. For each option we discussed the ability of each procedure to clear the patient of their stone burden.   For observation I described the risks which include but are not limited to silent renal damage, life-threatening infection, need for emergent surgery, failure to pass stone, and pain.   For ureteroscopy I described the risks which include heart attack, stroke, pulmonary embolus, death, bleeding, infection, damage to contiguous structures, positioning injury, ureteral stricture, ureteral avulsion, ureteral injury, need for ureteral stent, inability to perform ureteroscopy, need for an interval procedure, inability to clear stone burden, stent discomfort and pain.   For shockwave lithotripsy I described the risks which include arrhythmia, kidney contusion, kidney hemorrhage, need for transfusion, pain, inability to break up stone, inability to pass stone fragments, Steinstrasse, infection associated with obstructing stones, need for different surgical procedure, need for repeat shockwave lithotripsy.   Patient elected to proceed with URS. He is here today for URS.   Pt has received his cardiac clearance.

## 2023-11-07 NOTE — Anesthesia Preprocedure Evaluation (Addendum)
 Anesthesia Evaluation  Patient identified by MRN, date of birth, ID band Patient awake    Reviewed: Allergy & Precautions, NPO status , Patient's Chart, lab work & pertinent test results  Airway Mallampati: II  TM Distance: >3 FB     Dental no notable dental hx. (+) Teeth Intact   Pulmonary asthma , former smoker   Pulmonary exam normal breath sounds clear to auscultation       Cardiovascular hypertension, + CAD and + Past MI  Normal cardiovascular exam Rhythm:Regular Rate:Normal     Neuro/Psych  PSYCHIATRIC DISORDERS Anxiety     negative neurological ROS     GI/Hepatic negative GI ROS, Neg liver ROS,,,  Endo/Other  diabetes, Well Controlled, Type 2, Oral Hypoglycemic Agents  Obesity  Renal/GU Renal diseaseRight renal calculus  negative genitourinary   Musculoskeletal negative musculoskeletal ROS (+)    Abdominal  (+) + obese  Peds  Hematology negative hematology ROS (+)   Anesthesia Other Findings   Reproductive/Obstetrics                              Anesthesia Physical Anesthesia Plan  ASA: 2  Anesthesia Plan:    Post-op Pain Management: Minimal or no pain anticipated, Dilaudid IV, Precedex and Ofirmev IV (intra-op)*   Induction: Intravenous  PONV Risk Score and Plan: 3 and Treatment may vary due to age or medical condition and Ondansetron   Airway Management Planned: LMA  Additional Equipment: None  Intra-op Plan:   Post-operative Plan: Extubation in OR  Informed Consent: I have reviewed the patients History and Physical, chart, labs and discussed the procedure including the risks, benefits and alternatives for the proposed anesthesia with the patient or authorized representative who has indicated his/her understanding and acceptance.     Dental advisory given  Plan Discussed with: CRNA and Anesthesiologist  Anesthesia Plan Comments: (See PAT note from 6/4)          Anesthesia Quick Evaluation

## 2023-11-07 NOTE — Progress Notes (Signed)
 Case: [id number] Date/Time: 11/08/23 0715   Procedures:      CYSTOSCOPY/URETEROSCOPY/HOLMIUM LASER/STENT PLACEMENT (Right)     CYSTOSCOPY, WITH RETROGRADE PYELOGRAM (Right)   Anesthesia type: General   Diagnosis: Calculus of kidney [N20.0]   Pre-op diagnosis: RIGHT RENAL STONE   Location: [facility] ROOM 01 / [facility]   Surgeons: Connor Finner, MD       DISCUSSION: Connor Reed is a 63 year old male who is being evaluated prior to surgery above.  Past medical history significant for former smoking, HTN, history of NSTEMI (2018), CAD s/p PCI to LAD (2018), asthma, COPD, diabetes, left nephrectomy (~3086V), history of ACDF C5-C6 (2006), anxiety.  Patient follows with cardiology for history of NSTEMI and CAD status post PCI in 2018.  Last seen Dr. Alroy Reed on 11/06/2023 for preop clearance.  Patient denies any symptoms and can walk 3 miles.  Cleared for surgery:  "Pre op evaluation prior to renal stone surgery  He is stable from a cardiac standpoint and is at low risk for this surgery  He may hold his ASA for 5-7 days ( is already holding )  Continue to hold until the stent is out several days later.  Follow up with Connor Reed in several months"  Pt with hx of left nephrectomy. Reportedly due to an infection as a child. Will need labs DOS. Kidney function was normal in 02/2023.  VS: Ht 6' (1.829 m)   Wt 59.9 kg   BMI 17.90 kg/m   PROVIDERS: Connor Sabins., MD   LABS: Labs ordered DOS   IMAGES:   EKG 11/06/23:  Normal sinus rhythm Possible Anterior infarct , age undetermined When compared with ECG of 19-Dec-2016 06:48, T wave inversion no longer evident in Anterolateral leads Ant. MI changes have resolved.  CV: Echo 11/09/2022: IMPRESSIONS    1. Left ventricular ejection fraction, by estimation, is 55 to 60%. Left ventricular ejection fraction by PLAX is 64 %. The left ventricle has normal function. The left ventricle has no regional wall motion abnormalities. There is mild  left ventricular hypertrophy. Left ventricular diastolic parameters were normal. The average left ventricular global longitudinal strain is -17.2 %. The global longitudinal strain is abnormal, demonstrating abnormal strain of the the apical lateral wall.  2. Right ventricular systolic function is low normal. The right ventricular size is normal. There is normal pulmonary artery systolic pressure. The estimated right ventricular systolic pressure is 21.1 mmHg.  3. The mitral valve is grossly normal. Trivial mitral valve regurgitation.  4. The aortic valve is tricuspid. Aortic valve regurgitation is not visualized. Aortic valve sclerosis is present, with no evidence of aortic valve stenosis.  Comparison(s): Changes from prior study are noted. 12/20/2016: LVEF 55-60%, normal diastolic function.  Stress test 11/09/2022:  Findings are consistent with infarction with peri-infarct ischemia and a small area of apical anterior myocardium. The study is overall low risk.   LV perfusion is abnormal. There is evidence of ischemia. There is no evidence of infarction. Defect 1: There is a small defect with mild reduction in uptake present in the apical anterior location(s) that is partially reversible. There is normal wall motion in the defect area.   Left ventricular function is normal. Nuclear stress EF: 55 %. The left ventricular ejection fraction is normal (55-65%). End diastolic cavity size is normal. End systolic cavity size is normal.   Prior study available for comparison from 08/26/2019.  There is no significant change from this prior study.  Left heart cath 12/18/2016:  Non-ST elevation anterior wall myocardial infarction with vague ongoing chest pain greater than 24 hours. Subtotal occlusion thrombotic obstruction in the mid LAD which is also supplied by collaterals from the right coronary. Successful angioplasty and stenting of the mid LAD from 99.9% with TIMI grade one flow to 0% with TIMI grade  3 flow using Onyx 3.5 x 18 DES postdilated to 3.75 mm in diameter. Successful direct stenting of the mid circumflex 80% stenosis to 0% with TIMI grade 3 flow using a 3.5 x 12 mm Onyx deployed at 14 atm. 30-40% proximal to mid RCA which is noted above supplied collaterals to the apical and mid LAD. Left ventricular systolic dysfunction with anteroapical severe hypokinesis, ejection fraction 40%, and elevated LVEDP consistent with acute combined systolic and diastolic heart failure. Past Medical History:  Diagnosis Date   Asthma    Chronic bronchitis (HCC)    Diabetes mellitus without complication (HCC)    Hypertension    Myocardial infarction [facility]) 2018    Past Surgical History:  Procedure Laterality Date   LEFT HEART CATH AND CORONARY ANGIOGRAPHY N/A 12/18/2016   Procedure: Left Heart Cath and Coronary Angiography;  Surgeon: Connor Binning, MD;  Location: [facility] INVASIVE CV LAB;  Service: Cardiovascular;  Laterality: N/A;   left kidney removed     age 81 d/t infection    MEDICATIONS: No current facility-administered medications for this encounter.    albuterol  (VENTOLIN  HFA) 108 (90 Base) MCG/ACT inhaler   aspirin  81 MG chewable tablet   glyBURIDE (DIABETA) 2.5 MG tablet   lisinopril  (ZESTRIL ) 10 MG tablet   metFORMIN  (GLUCOPHAGE ) 1000 MG tablet   nitroGLYCERIN  (NITROSTAT ) 0.4 MG SL tablet   REPATHA  SURECLICK 140 MG/ML SOAJ   glucose blood (ONETOUCH VERIO) test strip   [facility] LANCETS FINE MISC   Connor Pennant, PA-C [facility] Short Stay/Anesthesiology [facility] Phone [phone] 11/07/2023 9:30 AM

## 2023-11-08 ENCOUNTER — Ambulatory Visit (HOSPITAL_COMMUNITY): Payer: Self-pay | Admitting: Medical

## 2023-11-08 ENCOUNTER — Ambulatory Visit (HOSPITAL_COMMUNITY)
Admission: RE | Admit: 2023-11-08 | Discharge: 2023-11-08 | Disposition: A | Discharge status: Home / Self Care | Attending: Urology | Admitting: Urology

## 2023-11-08 ENCOUNTER — Encounter (HOSPITAL_COMMUNITY): Payer: Self-pay | Admitting: Urology

## 2023-11-08 ENCOUNTER — Encounter (HOSPITAL_COMMUNITY)
Admission: RE | Disposition: A | Discharge status: Home / Self Care | Payer: Self-pay | Source: Home / Self Care | Attending: Urology

## 2023-11-08 ENCOUNTER — Ambulatory Visit (HOSPITAL_COMMUNITY)

## 2023-11-08 DIAGNOSIS — N201 Calculus of ureter: Secondary | ICD-10-CM | POA: Diagnosis present

## 2023-11-08 DIAGNOSIS — Z905 Acquired absence of kidney: Secondary | ICD-10-CM | POA: Insufficient documentation

## 2023-11-08 DIAGNOSIS — E119 Type 2 diabetes mellitus without complications: Secondary | ICD-10-CM | POA: Diagnosis not present

## 2023-11-08 DIAGNOSIS — Z981 Arthrodesis status: Secondary | ICD-10-CM | POA: Diagnosis not present

## 2023-11-08 DIAGNOSIS — I10 Essential (primary) hypertension: Secondary | ICD-10-CM | POA: Insufficient documentation

## 2023-11-08 DIAGNOSIS — Z833 Family history of diabetes mellitus: Secondary | ICD-10-CM | POA: Insufficient documentation

## 2023-11-08 DIAGNOSIS — Z6832 Body mass index (BMI) 32.0-32.9, adult: Secondary | ICD-10-CM | POA: Insufficient documentation

## 2023-11-08 DIAGNOSIS — Z955 Presence of coronary angioplasty implant and graft: Secondary | ICD-10-CM | POA: Insufficient documentation

## 2023-11-08 DIAGNOSIS — Z79899 Other long term (current) drug therapy: Secondary | ICD-10-CM | POA: Diagnosis not present

## 2023-11-08 DIAGNOSIS — I2510 Atherosclerotic heart disease of native coronary artery without angina pectoris: Secondary | ICD-10-CM | POA: Insufficient documentation

## 2023-11-08 DIAGNOSIS — Z7984 Long term (current) use of oral hypoglycemic drugs: Secondary | ICD-10-CM | POA: Diagnosis not present

## 2023-11-08 DIAGNOSIS — I252 Old myocardial infarction: Secondary | ICD-10-CM | POA: Diagnosis not present

## 2023-11-08 DIAGNOSIS — Z01818 Encounter for other preprocedural examination: Secondary | ICD-10-CM

## 2023-11-08 DIAGNOSIS — E669 Obesity, unspecified: Secondary | ICD-10-CM | POA: Diagnosis not present

## 2023-11-08 DIAGNOSIS — Z87891 Personal history of nicotine dependence: Secondary | ICD-10-CM | POA: Diagnosis not present

## 2023-11-08 DIAGNOSIS — J4489 Other specified chronic obstructive pulmonary disease: Secondary | ICD-10-CM | POA: Insufficient documentation

## 2023-11-08 HISTORY — PX: CYSTOSCOPY W/ RETROGRADES: SHX1426

## 2023-11-08 HISTORY — DX: Type 2 diabetes mellitus without complications: E11.9

## 2023-11-08 HISTORY — PX: CYSTOSCOPY/URETEROSCOPY/HOLMIUM LASER/STENT PLACEMENT: SHX6546

## 2023-11-08 LAB — CBC
HCT: 47.7 % (ref 39.0–52.0)
Hemoglobin: 16.2 g/dL (ref 13.0–17.0)
MCH: 30.7 pg (ref 26.0–34.0)
MCHC: 34 g/dL (ref 30.0–36.0)
MCV: 90.3 fL (ref 80.0–100.0)
Platelets: 274 10*3/uL (ref 150–400)
RBC: 5.28 MIL/uL (ref 4.22–5.81)
RDW: 13.2 % (ref 11.5–15.5)
WBC: 8.5 10*3/uL (ref 4.0–10.5)
nRBC: 0 % (ref 0.0–0.2)

## 2023-11-08 LAB — BASIC METABOLIC PANEL WITH GFR
Anion gap: 10 (ref 5–15)
BUN: 18 mg/dL (ref 8–23)
CO2: 19 mmol/L — ABNORMAL LOW (ref 22–32)
Calcium: 9 mg/dL (ref 8.9–10.3)
Chloride: 104 mmol/L (ref 98–111)
Creatinine, Ser: 0.84 mg/dL (ref 0.61–1.24)
GFR, Estimated: >60 mL/min (ref >60)
Glucose, Bld: 180 mg/dL — ABNORMAL HIGH (ref 70–99)
Potassium: 3.8 mmol/L (ref 3.5–5.1)
Sodium: 133 mmol/L — ABNORMAL LOW (ref 135–145)

## 2023-11-08 LAB — GLUCOSE, CAPILLARY
Glucose-Capillary: 171 mg/dL — ABNORMAL HIGH (ref 70–99)
Glucose-Capillary: 192 mg/dL — ABNORMAL HIGH (ref 70–99)

## 2023-11-08 LAB — HEMOGLOBIN A1C
Hgb A1c MFr Bld: 6.7 % — ABNORMAL HIGH (ref 4.8–5.6)
Mean Plasma Glucose: 145.59 mg/dL

## 2023-11-08 SURGERY — CYSTOSCOPY/URETEROSCOPY/HOLMIUM LASER/STENT PLACEMENT
Anesthesia: General | Laterality: Right

## 2023-11-08 MED ORDER — MIDAZOLAM HCL 2 MG/2ML IJ SOLN
INTRAMUSCULAR | Status: AC
  Filled 2023-11-08: qty 2

## 2023-11-08 MED ORDER — LACTATED RINGERS IV SOLN: INTRAVENOUS | Status: DC | PRN

## 2023-11-08 MED ORDER — PHENYLEPHRINE 80 MCG/ML (10ML) SYRINGE FOR IV PUSH (FOR BLOOD PRESSURE SUPPORT)
PREFILLED_SYRINGE | INTRAVENOUS | Status: DC | PRN
  Administered 2023-11-08: 160 ug via INTRAVENOUS

## 2023-11-08 MED ORDER — FENTANYL CITRATE (PF) 100 MCG/2ML IJ SOLN
INTRAMUSCULAR | Status: DC | PRN
  Administered 2023-11-08 (×2): 50 ug via INTRAVENOUS

## 2023-11-08 MED ORDER — OXYCODONE HCL 5 MG PO TABS: 5.0000 mg | ORAL_TABLET | Freq: Once | ORAL | Status: DC | PRN

## 2023-11-08 MED ORDER — FENTANYL CITRATE PF 50 MCG/ML IJ SOSY
50.0000 ug | PREFILLED_SYRINGE | INTRAMUSCULAR | Status: DC | PRN
  Administered 2023-11-08: 50 ug via INTRAVENOUS
  Filled 2023-11-08: qty 1

## 2023-11-08 MED ORDER — CHLORHEXIDINE GLUCONATE 0.12 % MT SOLN
15.0000 mL | Freq: Once | OROMUCOSAL | Status: AC
  Administered 2023-11-08: 15 mL via OROMUCOSAL

## 2023-11-08 MED ORDER — PROPOFOL 10 MG/ML IV BOLUS
INTRAVENOUS | Status: AC
  Filled 2023-11-08: qty 20

## 2023-11-08 MED ORDER — SODIUM CHLORIDE 0.9 % IR SOLN
Status: DC | PRN
  Administered 2023-11-08: 1000 mL
  Administered 2023-11-08: 3000 mL

## 2023-11-08 MED ORDER — HYDROMORPHONE HCL 1 MG/ML IJ SOLN
INTRAMUSCULAR | Status: AC
Start: 2023-11-08 — End: ?
  Filled 2023-11-08: qty 1

## 2023-11-08 MED ORDER — LIDOCAINE HCL (PF) 2 % IJ SOLN
INTRAMUSCULAR | Status: AC
  Filled 2023-11-08: qty 5

## 2023-11-08 MED ORDER — MIDAZOLAM HCL 2 MG/2ML IJ SOLN
INTRAMUSCULAR | Status: DC | PRN
  Administered 2023-11-08: 2 mg via INTRAVENOUS

## 2023-11-08 MED ORDER — FENTANYL CITRATE (PF) 100 MCG/2ML IJ SOLN
INTRAMUSCULAR | Status: AC
  Filled 2023-11-08: qty 2

## 2023-11-08 MED ORDER — PHENYLEPHRINE 80 MCG/ML (10ML) SYRINGE FOR IV PUSH (FOR BLOOD PRESSURE SUPPORT)
PREFILLED_SYRINGE | INTRAVENOUS | Status: AC
  Filled 2023-11-08: qty 10

## 2023-11-08 MED ORDER — TAMSULOSIN HCL 0.4 MG PO CAPS: 0.4000 mg | ORAL_CAPSULE | Freq: Every day | ORAL | 0 refills | Status: AC

## 2023-11-08 MED ORDER — OXYCODONE HCL 5 MG/5ML PO SOLN: 5.0000 mg | Freq: Once | ORAL | Status: DC | PRN

## 2023-11-08 MED ORDER — CIPROFLOXACIN IN D5W 400 MG/200ML IV SOLN
INTRAVENOUS | Status: DC | PRN
  Administered 2023-11-08: 400 mg via INTRAVENOUS

## 2023-11-08 MED ORDER — CIPROFLOXACIN IN D5W 400 MG/200ML IV SOLN
400.0000 mg | INTRAVENOUS | Status: DC
  Filled 2023-11-08: qty 200

## 2023-11-08 MED ORDER — ONDANSETRON HCL 4 MG/2ML IJ SOLN: 4.0000 mg | Freq: Once | INTRAMUSCULAR | Status: DC | PRN

## 2023-11-08 MED ORDER — HYOSCYAMINE SULFATE 0.125 MG PO TBDP: 0.1250 mg | ORAL_TABLET | Freq: Four times a day (QID) | ORAL | 0 refills | Status: AC | PRN

## 2023-11-08 MED ORDER — IOPAMIDOL (ISOVUE-300) INJECTION 61%: INTRAVENOUS | Status: DC | PRN

## 2023-11-08 MED ORDER — DROPERIDOL 2.5 MG/ML IJ SOLN: 0.6250 mg | Freq: Once | INTRAMUSCULAR | Status: DC | PRN

## 2023-11-08 MED ORDER — LACTATED RINGERS IV SOLN: INTRAVENOUS | Status: DC

## 2023-11-08 MED ORDER — PHENAZOPYRIDINE HCL 200 MG PO TABS: 200.0000 mg | ORAL_TABLET | Freq: Three times a day (TID) | ORAL | 0 refills | Status: AC | PRN

## 2023-11-08 MED ORDER — ONDANSETRON HCL 4 MG/2ML IJ SOLN
INTRAMUSCULAR | Status: DC | PRN
  Administered 2023-11-08: 4 mg via INTRAVENOUS

## 2023-11-08 MED ORDER — ONDANSETRON HCL 4 MG/2ML IJ SOLN
INTRAMUSCULAR | Status: AC
  Filled 2023-11-08: qty 2

## 2023-11-08 MED ORDER — HYDROMORPHONE HCL 1 MG/ML IJ SOLN
0.2500 mg | INTRAMUSCULAR | Status: DC | PRN
  Administered 2023-11-08 (×2): 0.5 mg via INTRAVENOUS

## 2023-11-08 MED ORDER — INSULIN ASPART 100 UNIT/ML IJ SOLN: 0.0000 [IU] | INTRAMUSCULAR | Status: DC | PRN

## 2023-11-08 MED ORDER — ORAL CARE MOUTH RINSE: 15.0000 mL | Freq: Once | OROMUCOSAL | Status: AC

## 2023-11-08 MED ORDER — IOHEXOL 300 MG/ML  SOLN
INTRAMUSCULAR | Status: DC | PRN
  Administered 2023-11-08: 50 mL

## 2023-11-08 MED ORDER — METHOCARBAMOL 750 MG PO TABS: 750.0000 mg | ORAL_TABLET | Freq: Four times a day (QID) | ORAL | 0 refills | Status: AC

## 2023-11-08 MED ORDER — LIDOCAINE HCL (CARDIAC) PF 100 MG/5ML IV SOSY
PREFILLED_SYRINGE | INTRAVENOUS | Status: DC | PRN
Start: 2023-11-08 — End: 2023-11-08
  Administered 2023-11-08: 100 mg via INTRATRACHEAL

## 2023-11-08 SURGICAL SUPPLY — 29 items
BAG COUNTER SPONGE SURGICOUNT (BAG) IMPLANT
BAG URO CATCHER STRL LF (MISCELLANEOUS) ×2 IMPLANT
BASKET STONE NCOMPASS (UROLOGICAL SUPPLIES) IMPLANT
BASKET ZERO TIP NITINOL 2.4FR (BASKET) IMPLANT
CATH URETL OPEN 5X70 (CATHETERS) ×2 IMPLANT
CATH UROLOGY TORQUE 40 (MISCELLANEOUS) IMPLANT
CLOTH BEACON ORANGE TIMEOUT ST (SAFETY) ×2 IMPLANT
EXTRACTOR STONE 1.7FRX115CM (UROLOGICAL SUPPLIES) IMPLANT
FIBER LASER MOSES 200 DFL (Laser) IMPLANT
FIBER LASER MOSES 365 DFL (Laser) IMPLANT
GLOVE BIO SURGEON STRL SZ8 (GLOVE) ×2 IMPLANT
GOWN STRL REUS W/ TWL XL LVL3 (GOWN DISPOSABLE) ×2 IMPLANT
GUIDEWIRE ANG ZIPWIRE 038X150 (WIRE) IMPLANT
GUIDEWIRE STR DUAL SENSOR (WIRE) ×2 IMPLANT
KIT TURNOVER KIT A (KITS) IMPLANT
LASER FIB FLEXIVA PULSE ID 365 (Laser) IMPLANT
LASER FIB FLEXIVA PULSE ID 550 (Laser) IMPLANT
LASER FIB FLEXIVA PULSE ID 910 (Laser) IMPLANT
MANIFOLD NEPTUNE II (INSTRUMENTS) ×2 IMPLANT
NS IRRIG 1000ML POUR BTL (IV SOLUTION) IMPLANT
PACK CYSTO (CUSTOM PROCEDURE TRAY) ×2 IMPLANT
SHEATH NAV HD 11/13X46 (SHEATH) IMPLANT
SHEATH NAVIGATOR HD 11/13X36 (SHEATH) ×2 IMPLANT
SHEATH NAVIGATOR HD 12/14X28 (SHEATH) IMPLANT
SHEATH NAVIGATOR HD 12/14X36 (SHEATH) IMPLANT
STENT URET 6FRX26 CONTOUR (STENTS) IMPLANT
TRACTIP FLEXIVA PULS ID 200XHI (Laser) IMPLANT
TUBING CONNECTING 10 (TUBING) ×2 IMPLANT
TUBING UROLOGY SET (TUBING) ×2 IMPLANT

## 2023-11-08 NOTE — Op Note (Signed)
 Preoperative diagnosis: right ureteral calculus  Postoperative diagnosis: right ureteral calculus  Procedure:  Cystoscopy right ureteroscopy and stone removal Basket stone extraction right 50F x 26 ureteral stent placement with strings right retrograde pyelography with interpretation  Surgeon: Aimee Alf MD.  Anesthesia: General  Complications: None  Intraoperative findings: Patient appears to have a right ureteral reimplant which was not divulge prior to the case. There was a band going to the trigone to the dome of the bladder at the location of the ureteral reimplant on the right side. Significant J-hook in the right ureter but was able to pass this no significant hydronephrosis no filling defects and retrograde pyelogram. There were no tumors or other abnormalities within the ureter or renal pelvis. 3 small stones less than 1 mm in size largest one was basketed and removed.  The other were unable to be basketed. 6 x 26 double-J stent with strings placed in the right ureter under fluoroscopy  EBL: Minimal  Specimens: right ureteral calculus Right renal pelvis cytology.  Disposition of specimens: [facility] for stone analysis  Indication: Connor Reed is a 63 y.o.   patient with a right solitary kidney after removal when he was a child for repeat infections.  He has small stone stones in the kidney which he is concerned are causing pain he would like them removed.. After reviewing the management options for treatment, the patient elected to proceed with the above surgical procedure(s). We have discussed the potential benefits and risks of the procedure, side effects of the proposed treatment, the likelihood of the patient achieving the goals of the procedure, and any potential problems that might occur during the procedure or recuperation. Informed consent has been obtained.   Description of procedure:  The patient was taken to the operating room and  general anesthesia was induced.  The patient was placed in the dorsal lithotomy position, prepped and draped in the usual sterile fashion, and preoperative antibiotics were administered. A preoperative time-out was performed.   Cystourethroscopy was performed.  The patient's urethra was examined and was normal.  Patient had a normal urethra with no strictures he had a large prostate with high bladder neck very friable prostate the blood easily left ureteral orifice was noted to be abnormal from the previous nephrectomy right ureteral orifice was attempted and found several times but was then noted to be at the dome on the right side with a fibrous band/scarring going to the trigone  Attention then turned to the right ureteral orifice making several attempts were made to access the right ureteral orifice with a wire which was located at the dome at the base of this fibrous band.  Eventually a Glidewire was used to access the right ureter retrograde pyelogram after placing the 5 French ureteral graft demonstrate this was the correct location of the ureter.  Using a KMP catheter we were able to angle the wire up the ureter and was able to reach the renal pelvis.  The Glidewire was then replaced with a sensor wire.  Then a single lumen semirigid ureteroscope was exchanged with the cystoscope and segmental rigid ureteroscope evaluate the right ureter there were no abnormalities no scarring no tumors no other abnormalities noted.  Once in the renal pelvis a second sensor was placed in the collecting system.   A ureteral access sheath size 45cm  11-13 [place] was inserted over the sensor wire under fluoro to confirm proper placement at the ureteropelvic junction. The obturator and sensor wire  were then removed, leaving the other wire on the outside of the sheath as a safety wire.   A flexible ureteroscopy was then advanced through the sheath and into the collecting system. Pan pyeloscopy was then performed.  There  were several small stones all 1 to 3 mm in size.  The largest was 3 mm in size this was grasped and removed.  There were no tumors or other abnormalities within the renal pelvis.  Cytology was taken from the renal pelvis.  And sent for pathology.  After attempting to grasp a small stone several times they were unable to grasp due to the size being less than 1 mm.  Pan pyeloscopy was then performed and no stones greater than 2mm were visualized.   The sheath was then removed leaving the safety wire in place. Visualization of the ureter on withdrawal of the sheath showed no injury to the ureter.   After the scope was removed the stent was then placed over the wire under fluoroscopy 6 x 26 double-J with strings was placed per placement the kidney was confirmed for proper placement the bladder was confirmed with fluoroscopy.  The 22 French cystoscope was then advanced in the bladder and the bladder was drained.    The patient appeared to tolerate the procedure well and without complications.  The patient was able to be awakened and transferred to the recovery unit in satisfactory condition.   Disposition: The tether of the stent was left on and secured to the ventral aspect of the patient's penis. . The patient has been scheduled for followup in 6 weeks with a renal ultrasound.    Discussed with the patient's wife that pain is unlikely to be urology related and the bleeding in the urine may be from a friable prostate which appeared to be present based on my cystoscopic findings there is no evidence of any bleeding coming from the kidney or other abnormalities.  Will discuss in 6 weeks.  After renal ultrasound.

## 2023-11-08 NOTE — Discharge Instructions (Addendum)
 DISCHARGE INSTRUCTIONS FOR Ureteroscopy and/or Ureteral Stent Placement  MEDICATIONS:  1.  Robaxin 2. Tamsulosin   3. Hyoscyamine  4. Methocarbamol   ACTIVITY:  1. No strenuous activity x 1week  2. No driving while on narcotic pain medications  3. Drink plenty of water  4. Continue to walk at home - it is normal to see blood in the urine while the stent is in place, so keep active, but don't over do it.  5. May return to work/school tomorrow or when you feel ready  6. You may experience some pain when urinating in the kidney on the side that was operated on while the stent is in place this is normal  WHAT IS NORMAL TO EXPERIENCE: It is normal to feel the urge to urinate while the stent is in place It is normal to have blood in your urine while the stent is in place  It sometimes can be normal to have pain in your kidney when you urinate   BATHING:  1. You can shower and we recommend daily showers  2. You have a string coming from your urethra: The stent string is attached to your ureteral stent. Do not pull on this until instructed.   DIET: You may return to your normal diet immediately. Because of the raw surface of your bladder, alcohol, spicy foods, acid type foods and drinks with caffeine may cause irritation or frequency and should be used in moderation. To keep your urine flowing freely and to avoid constipation, drink plenty of fluids during the day ( 8-10 glasses ). Tip: Avoid cranberry juice because it is very acidic.  SIGNS/SYMPTOMS TO CALL:  Please call us  if you have a fever greater than 101.5, uncontrolled nausea/vomiting, uncontrolled pain, dizziness, unable to urinate, bloody urine with clots greater than the size of a quarter, chest pain, shortness of breath, leg swelling, leg pain, redness around wound, drainage from wound, or any other concerns or questions.   You can reach us  at [phone].   FOLLOW-UP:  1. You may remove your stent in on Monday 11/12/23. To do  this go into the shower, grab hold of the tether coming from your urethra. Pull the tether consistent motion until the stent is removed from your body. The stent will be around 10 inches long with a curl on either end.  2. You you have been set up for f/u in 6-8 weeks

## 2023-11-08 NOTE — Transfer of Care (Signed)
 Immediate Anesthesia Transfer of Care Note  Patient: Connor Reed  Procedure(s) Performed: CYSTOSCOPY/URETEROSCOPY/HOLMIUM LASER/STENT PLACEMENT (Right) CYSTOSCOPY, WITH RETROGRADE PYELOGRAM (Right)  Patient Location: PACU  Anesthesia Type:General  Level of Consciousness: sedated and responds to stimulation  Airway & Oxygen Therapy: Patient Spontanous Breathing and Patient connected to nasal cannula oxygen  Post-op Assessment: Report given to RN and Post -op Vital signs reviewed and stable  Post vital signs: Reviewed and stable  Last Vitals:  Vitals Value Taken Time  BP 115/63 11/08/23 0851  Temp    Pulse 66 11/08/23 0853  Resp 13 11/08/23 0853  SpO2 96 % 11/08/23 0853  Vitals shown include unfiled device data.  Last Pain:  Vitals:   11/08/23 0658  TempSrc:   PainSc: 4       Patients Stated Pain Goal: 3 (11/05/23 1304)  Complications: No notable events documented.

## 2023-11-08 NOTE — Anesthesia Procedure Notes (Signed)
 Procedure Name: LMA Insertion Date/Time: 11/08/2023 7:21 AM  Performed by: Darlena Ego, CRNAPre-anesthesia Checklist: Patient identified, Emergency Drugs available, Suction available and Patient being monitored Patient Re-evaluated:Patient Re-evaluated prior to induction Oxygen Delivery Method: Circle System Utilized Preoxygenation: Pre-oxygenation with 100% oxygen Induction Type: IV induction Ventilation: Mask ventilation without difficulty LMA: LMA inserted LMA Size: 5.0 Number of attempts: 1 Airway Equipment and Method: Bite block Placement Confirmation: positive ETCO2 Tube secured with: Tape Dental Injury: Teeth and Oropharynx as per pre-operative assessment

## 2023-11-08 NOTE — Anesthesia Postprocedure Evaluation (Signed)
 Anesthesia Post Note  Patient: DRAKEN FARRIOR  Procedure(s) Performed: CYSTOSCOPY/URETEROSCOPY/HOLMIUM LASER/STENT PLACEMENT (Right) CYSTOSCOPY, WITH RETROGRADE PYELOGRAM (Right)     Patient location during evaluation: PACU Anesthesia Type: General Level of consciousness: awake and alert and oriented Pain management: pain level controlled Vital Signs Assessment: post-procedure vital signs reviewed and stable Respiratory status: spontaneous breathing, nonlabored ventilation and respiratory function stable Cardiovascular status: blood pressure returned to baseline and stable Postop Assessment: no apparent nausea or vomiting Anesthetic complications: no   No notable events documented.  Last Vitals:  Vitals:   11/08/23 0900 11/08/23 0915  BP: 105/86 (!) 149/84  Pulse: 66 66  Resp: 11 17  Temp:    SpO2: 94% 95%    Last Pain:  Vitals:   11/08/23 0915  TempSrc:   PainSc: 0-No pain                 Maryiah Olvey A.

## 2023-11-09 ENCOUNTER — Encounter (HOSPITAL_COMMUNITY): Payer: Self-pay | Admitting: Urology

## 2023-11-09 LAB — CYTOLOGY - NON PAP

## 2023-11-15 LAB — STONE ANALYSIS
Calcium Oxalate Dihydrate: 50 %
Calcium Oxalate Monohydrate: 50 %
Weight Calculi: <1 mg

## 2023-12-17 ENCOUNTER — Other Ambulatory Visit (HOSPITAL_COMMUNITY): Payer: Self-pay

## 2023-12-18 ENCOUNTER — Telehealth: Payer: Self-pay | Admitting: Pharmacy Technician

## 2023-12-18 NOTE — Telephone Encounter (Signed)
 Pharmacy Patient Advocate Encounter  Received notification from [facility] federal that Prior Authorization for repatha  has been APPROVED from 11/17/23 to 12/16/24

## 2023-12-21 ENCOUNTER — Encounter: Payer: Self-pay | Admitting: Advanced Practice Midwife

## 2024-03-04 ENCOUNTER — Other Ambulatory Visit: Payer: Self-pay | Admitting: Cardiology

## 2024-03-04 DIAGNOSIS — E7800 Pure hypercholesterolemia, unspecified: Secondary | ICD-10-CM

## 2024-03-04 DIAGNOSIS — I2510 Atherosclerotic heart disease of native coronary artery without angina pectoris: Secondary | ICD-10-CM
